# Patient Record
Sex: Female | Born: 1970 | Race: White | Hispanic: No | Marital: Married | Smoking: Former smoker
Health system: Southern US, Community
[De-identification: ages and names within clinical notes are randomized; demographics above are authoritative.]

## PROBLEM LIST (undated history)

## (undated) DIAGNOSIS — G4700 Insomnia, unspecified: Secondary | ICD-10-CM

## (undated) DIAGNOSIS — E079 Disorder of thyroid, unspecified: Secondary | ICD-10-CM

## (undated) DIAGNOSIS — F419 Anxiety disorder, unspecified: Secondary | ICD-10-CM

## (undated) DIAGNOSIS — F32A Depression, unspecified: Secondary | ICD-10-CM

## (undated) DIAGNOSIS — Z8041 Family history of malignant neoplasm of ovary: Secondary | ICD-10-CM

## (undated) DIAGNOSIS — E039 Hypothyroidism, unspecified: Secondary | ICD-10-CM

## (undated) DIAGNOSIS — G4730 Sleep apnea, unspecified: Secondary | ICD-10-CM

## (undated) DIAGNOSIS — J3489 Other specified disorders of nose and nasal sinuses: Secondary | ICD-10-CM

## (undated) DIAGNOSIS — R413 Other amnesia: Secondary | ICD-10-CM

## (undated) DIAGNOSIS — F329 Major depressive disorder, single episode, unspecified: Secondary | ICD-10-CM

## (undated) HISTORY — PX: TUBAL LIGATION: SHX77

## (undated) HISTORY — DX: Other amnesia: R41.3

## (undated) HISTORY — DX: Insomnia, unspecified: G47.00

## (undated) HISTORY — DX: Sleep apnea, unspecified: G47.30

## (undated) HISTORY — PX: LAPAROSCOPIC GASTRIC BANDING: SHX1100

## (undated) HISTORY — DX: Other specified disorders of nose and nasal sinuses: J34.89

## (undated) HISTORY — DX: Family history of malignant neoplasm of ovary: Z80.41

---

## 2004-11-18 ENCOUNTER — Emergency Department: Payer: Self-pay | Admitting: Emergency Medicine

## 2007-09-19 ENCOUNTER — Emergency Department: Payer: Self-pay | Admitting: Emergency Medicine

## 2007-09-19 ENCOUNTER — Other Ambulatory Visit: Payer: Self-pay

## 2014-08-06 ENCOUNTER — Emergency Department
Admit: 2014-08-06 | Disposition: A | Discharge status: Home / Self Care | Payer: Self-pay | Admitting: Emergency Medicine

## 2014-08-06 LAB — CBC
HCT: 38.2 % (ref 35.0–47.0)
HGB: 12.3 g/dL (ref 12.0–16.0)
MCH: 29.5 pg (ref 26.0–34.0)
MCHC: 32.2 g/dL (ref 32.0–36.0)
MCV: 92 fL (ref 80–100)
Platelet: 403 10*3/uL (ref 150–440)
RBC: 4.16 10*6/uL (ref 3.80–5.20)
RDW: 13.5 % (ref 11.5–14.5)
WBC: 13 10*3/uL — AB (ref 3.6–11.0)

## 2014-08-06 LAB — DRUG SCREEN, URINE
AMPHETAMINES, UR SCREEN: NEGATIVE
BARBITURATES, UR SCREEN: NEGATIVE
Benzodiazepine, Ur Scrn: NEGATIVE
Cannabinoid 50 Ng, Ur ~~LOC~~: NEGATIVE
Cocaine Metabolite,Ur ~~LOC~~: NEGATIVE
MDMA (Ecstasy)Ur Screen: NEGATIVE
METHADONE, UR SCREEN: NEGATIVE
OPIATE, UR SCREEN: NEGATIVE
Phencyclidine (PCP) Ur S: NEGATIVE
Tricyclic, Ur Screen: NEGATIVE

## 2014-08-06 LAB — COMPREHENSIVE METABOLIC PANEL
ALK PHOS: 46 U/L
ANION GAP: 9 (ref 7–16)
AST: 19 U/L
Albumin: 4.3 g/dL
BUN: 17 mg/dL
Bilirubin,Total: 0.5 mg/dL
CALCIUM: 8.9 mg/dL
CO2: 29 mmol/L
Chloride: 101 mmol/L
Creatinine: 0.75 mg/dL
EGFR (Non-African Amer.): >60
GLUCOSE: 84 mg/dL
POTASSIUM: 3 mmol/L — AB
SGPT (ALT): 15 U/L
Sodium: 139 mmol/L
Total Protein: 7.6 g/dL

## 2014-08-06 LAB — ACETAMINOPHEN LEVEL: Acetaminophen: <10 ug/mL

## 2014-08-06 LAB — SALICYLATE LEVEL: Salicylates, Serum: <4 mg/dL

## 2014-08-06 LAB — TSH: THYROID STIMULATING HORM: 3.03 u[IU]/mL

## 2014-08-06 LAB — ETHANOL: Ethanol: <5 mg/dL

## 2014-08-30 NOTE — Consult Note (Signed)
Brief Consult Note: Diagnosis: adjustment disorder with anxiety.   Patient was seen by consultant.   Consult note dictated.   Orders entered.   Discussed with Attending MD.   Comments: Psychiatry: PAtient seen and note done. Discussed with Dr Jimmye Norman. Anxious and possibly depressed and adjusting to sobriety but not psychotic or dangerous. Suggest trazodone 100mg  qhs for sleep and follow up out pt appt. Script done.  Electronic Signatures: Gonzella Lex (MD)  (Signed 07-Apr-16 19:23)  Authored: Brief Consult Note   Last Updated: 07-Apr-16 19:23 by Gonzella Lex (MD)

## 2014-08-30 NOTE — Consult Note (Addendum)
PATIENT NAME:  Stephanie Larson, Stephanie Larson MR#:  [id number] DATE OF BIRTH:  October 27, 1970  DATE OF CONSULTATION:  08/06/2014  CONSULTING PHYSICIAN:  Gonzella Lex, MD  IDENTIFYING INFORMATION AND REASON FOR CONSULT: A 44 year old woman seen in the Emergency Room. Consult for sleep and memory loss.   PATIENT'S CHIEF COMPLAINT: "I can't sleep."   HISTORY OF PRESENT ILLNESS: Information obtained from the patient and from her boyfriend and from the chart. The patient reports that she has not been able to sleep at all since this past Sunday. She is feeling drowsy during the day but at nighttime has not been able to fall asleep at all. She is feeling confused during the day and feels like she is having memory loss; feels out of it and like she is cognitively impaired. She denies feeling sad or depressed. She denies having any hallucinations. She admits that she has been very anxious and preoccupied by multiple worries recently. She is involved in a long running conflict with several members of her family who are angry at her. Additionally, her adult daughter recently had a DUI and the patient is very concerned for her welfare. She says that these things weigh on her mind all the time and that during the daytime she cannot stop thinking about them. This has all been building up over the last couple of weeks. The patient denies any suicidal or homicidal ideation. She is not drinking. She has fairly recently discontinued her previous abuse of multiple substances. She had been (Dictation Anomaly) <<takingMISSING TEXT>> narcotics, which she was obtaining largely off the street but self discontinued them in the middle of March. She also stopped taking her Ativan, which she was also obtaining off the street a couple of weeks ago.   PAST PSYCHIATRIC HISTORY: The patient has a history of long-standing substance abuse problems. She has been abusing benzodiazepines and narcotics primarily although cocaine as well. She only recently  decided that she was going to make a serious effort to stop and has now been off of drugs for about a couple of weeks.   The patient is on Wellbutrin and Celexa, which had been chronically prescribed medicines by her primary care doctor for depression. She has (Dictation Anomaly) <<had multipleMISSING TEXT>> psychiatric hospitalizations.   SOCIAL HISTORY: The patient lives with her long-term boyfriend. They appear to have a supportive relationship. She has 3 adult children ranging from ages 56 to the early 70s. The 2 sons live with her. The daughter who is the oldest does not live with her. The patient is not currently working outside the home. She is involved in a conflict with several members of her extended family. If I understood the story correctly, there had been members of her family who had been living with her and her boyfriend for some time and not contributing to the household. She finally asked them to leave and now there is a rift in the family. This is also upsetting her.   MEDICAL HISTORY: Hypothyroid. Restless leg syndrome. Chronic allergies.   SUBSTANCE ABUSE HISTORY: As noted above, long-standing history of abuse of narcotics and benzodiazepines and cocaine.   FAMILY HISTORY: Positive for some other substance abuse.   CURRENT MEDICATIONS: Synthroid 150 mcg per day, Wellbutrin 400 mg sustained release taken divided, Celexa 40 mg a day, Requip 1 mg twice a day, and she is taking over-the-counter Benadryl 50 mg twice a day for allergies.   MEDICATION ALLERGIES: None.   REVIEW OF SYSTEMS: Subjective memory impairment and  confusion. No hallucinations. Difficulty sleeping with her subjective feeling that she has not slept at all in several days. No other acute physical complaints other than her chronic pain in her wrists from carpal tunnel syndrome.   MENTAL STATUS EXAMINATION: Casually dressed, adequately groomed woman, looks her stated age. Cooperative with the interview. Good eye  contact. Psychomotor activity largely normal. Affect: Frequently looks confused. She turns to her boyfriend frequently for the answers to questions, even trivial ones to which she obviously knows the answer. Not tearful. Relaxes a little bit as the interview progresses. Mood stated as being worried. Thoughts nonpsychotic. Lucid. At times she seems to have some slowness and almost some thought blocking. Denies auditory or visual hallucinations. Denies suicidal or homicidal ideation. She is alert and oriented x 4. She can repeat 3 words immediately, remembers only 1 of them at 3 minutes. Judgment and insight appear to be normal. Intelligence normal.   LABORATORY RESULTS: Drug screen is all negative. Chemistry panel: Low potassium at 3.0, otherwise, normal. Alcohol level negative. TSH normal.   VITAL SIGNS: Blood pressure 131/86, respirations 16, pulse 83, temperature 97.9.   ASSESSMENT: A 44 year old woman with a history of chronic depression and anxiety and a history of substance abuse who is in the early stages of sobriety. Presents with subjective lack of sleep and problems with her short-term memory. On further questioning (Dictation Anomaly) <<and review ofMISSING TEXT>> mental status, the patient is clearly not demented. Her memory for recent events is actually intact once she starts relaxing into the conversation. She is able finally to discuss in more detail the anxiety and worry that is troubling her. She is not reporting being depressed and has no suicidal ideation, but the differential diagnosis would include depression as well as an adjustment disorder with anxiety on top of a chronic anxiety condition. Additionally, I suspect that her early stage sobriety is increasing her anxiety and she may still be having some late withdrawal confusion although certainly not a delirium. There is no sign of acute dangerousness.   TREATMENT PLAN: I spent time doing a fair bit of psychoeducation with the patient  as well as supportive and explanatory therapy. I propose starting trazodone 100 mg at night for sleep. I have reassured her that this is not typically an abusable drug. Continue her usual psychiatric medicines. I have strongly encouraged her to begin seeing a psychiatrist for medication management as well as counseling. It turns out she actually has an appointment for April 15 to see a psychiatrist in the hospital outpatient office. I encouraged her to make sure she follows up with that. No indication for commitment, no indication for hospitalization. Case discussed with Emergency Room physician. Prescription given for the trazodone.   DIAGNOSIS, PRINCIPAL AND PRIMARY:  AXIS I: Adjustment disorder with anxiety.   SECONDARY DIAGNOSES:   AXIS I: Depression, not otherwise specified.  AXIS II: Benzodiazepine, opiate, and cocaine abuse, in early remission.  AXIS III: Carpal tunnel syndrome bilaterally with chronic pain, restless leg syndrome, hypothyroid.    ____________________________ Gonzella Lex, MD jtc:AT D: 08/06/2014 23:44:03 ET T: 08/07/2014 01:13:08 ET JOB#: [id number]  cc: Gonzella Lex, MD, <Dictator> Gonzella Lex MD ELECTRONICALLY SIGNED 09/04/2014 10:08

## 2014-08-30 NOTE — Consult Note (Addendum)
PATIENT NAME:  Stephanie Larson, Stephanie Larson MR#:  [id number] DATE OF BIRTH:  06-10-1970  DATE OF CONSULTATION:    REFERRING PHYSICIAN:   CONSULTING PHYSICIAN:  Gonzella Lex, MD  IDENTIFYING INFORMATION AND REASON FOR CONSULTATION: A 44 year old woman seen in the Emergency Room. Consult for sleep and memory loss.   CHIEF COMPLAINT: "I cannot sleep."   HISTORY OF PRESENT ILLNESS: Information obtained from the patient and from her boyfriend and from the chart. The patient reports has not been able to sleep at all since this past Sunday. She is feeling drowsy during the day, but at nighttime has not been able to fall asleep at all. She is feeling confused during the day and feels like she is having memory loss. Feels out of it and like she is cognitively impaired. She denies feeling sad or depressed. She denies having any hallucinations. She admits that she has been very anxious and preoccupied by multiple worries recently. She is involved in a long running conflict with several members of her family who are angry at her. Additionally, her adult daughter recently had a DUI and the patient is very concerned for her welfare. She says that these things weigh on her mind all the time and that during the daytime she cannot stop thinking about them. This has all been building up over the last couple of weeks. The patient denies any suicidal or homicidal ideation. She is not drinking. She has a fairly recently discontinued her previous abuse of multiple substances. She had been (Dictation Anomaly)<<takingMISSING TEXT>> narcotics, which she was obtaining largely off the street, but self-discontinued in the middle of March. She also stopped taking her Ativan, which she was also obtaining off the street couple of weeks ago.   PAST PSYCHIATRIC HISTORY: The patient has a history of longstanding substance abuse problems. She has been abusing benzodiazepines and narcotics primarily, although cocaine as well. She only recently decided  that she was going to make a serious effort to stop and has now been off drugs for about a couple of weeks.   The patient is on Wellbutrin and Celexa, which have been chronically prescribed medicines by her primary care doctor for depression. She has no history of suicide attempt or psychiatric hospitalizations.   SOCIAL HISTORY: The patient lives with her long-term boyfriend. They appear to have supportive relationship. She has 3 adult children ranging from ages 35 to the early 54s. The 2 sons live with her, the daughter, who is the oldest, does not live with her. The patient is not currently working outside the home. She is involved in a conflict with several members of her extended family. If I understood the story correctly, there had been members of her family who had been living with her and her boyfriend for some time and not contributing to the household. She finally asked him to leave and now there is a rift in the family. This is also upsetting her.   PAST MEDICAL HISTORY: Hypothyroid. Restless leg syndrome. Chronic allergies.   SUBSTANCE ABUSE HISTORY: As noted above, long-standing history of abuse of narcotics and benzodiazepines and cocaine.   FAMILY HISTORY: Positive for some other substance abuse.   CURRENT MEDICATIONS: Synthroid 150 mcg per day; Wellbutrin 400 mg sustained release, taken divided; Celexa 40 mg a day; Requip 1 mg twice a day; and she is taking over-the-counter Benadryl 50 mg twice a day for allergies.   MEDICATION ALLERGIES: None.   REVIEW OF SYSTEMS: Subjective memory impairment and confusion. No  hallucinations. Difficulty sleeping with her subjective feeling that she has not slept at all in several days. No other acute physical complaints other than her chronic pain in her wrists from carpal tunnel syndrome.   MENTAL STATUS EXAMINATION: Casually dressed, adequately groomed woman, looks her stated age. Cooperative with the interview. Good eye contact. Psychomotor  activity largely normal. Affect frequently looks confused. She turns to her boyfriend frequently for answers to questions even trivial ones to which she obviously knows the answer. Not tearful. Relaxes a little bit as the interview progresses. Mood stated as being worried. Thoughts nonpsychotic, lucid. At times she seems to have some slowness and almost some thought blocking. Denies auditory or visual hallucinations. Denies suicidal or homicidal ideation. She is alert and oriented x 4. She can repeat 3 words immediately, remembers only 1 of them at 3 minutes. Judgment and insight appear to be normal. Intelligence normal.   LABORATORY RESULTS: Drug screen is all negative. Chemistry panel: Low potassium at 3.0, otherwise normal. Alcohol level negative. TSH normal.   VITAL SIGNS: Blood pressure 131/86, respirations 16, pulse 83, temperature 97.9.   ASSESSMENT: A 44 year old woman with a history of chronic depression and anxiety and a history of substance abuse who is in the early stages of sobriety presents with subjective lack of sleep and problems with her short-term memory. On further questioning and mental status, the patient is clearly not demented. Her memory for recent events is actually intact once she starts relaxing into the conversation. She is able finally to discuss in more detail the anxiety and worry that is troubling her. She is not reporting being depressed and has no suicidal ideation, but the differential diagnosis would include depression as well as an adjustment disorder with anxiety on top of chronic anxiety which condition. Additionally, I suspect that her early stage sobriety is increasing her anxiety and she may still be having some late withdrawal, confusion, although certainly not a delirium. There is no sign of acute dangerousness.   TREATMENT PLAN: I spent time doing a fair bit of psychoeducation with the patient as well as supportive and explanatory therapy. I proposed starting  trazodone 100 mg at night for sleep. I have reassured her that this is not typically abusable drugs. Continue her usual psychiatric medicines. I have strongly encouraged her to begin seeing a psychiatrist for medication management as well as counseling. It turns out she actually has an appointment for April 15 to see a psychiatrist in the hospital outpatient office. I encourage her to make sure she follows up with that. No indication for commitment. No indication for hospitalization. The case discussed with Emergency Room physician. Prescription given for the trazodone.   DIAGNOSES, PRINCIPAL AND PRIMARY:  AXIS I: Adjustment disorder with anxiety.   SECONDARY DIAGNOSES: AXIS I:  1.  Depression, not otherwise specified.  2.  Benzodiazepine, opiate, and cocaine abuse, in early remission.  AXIS III: Carpal tunnel syndrome bilaterally with chronic pain, restless leg syndrome, hypothyroid.   ____________________________ Gonzella Lex, MD jtc:bm D: 08/06/2014 23:44:03 ET T: 08/08/2014 00:13:40 ET JOB#: [id number]  cc: Gonzella Lex, MD, <Dictator> Gonzella Lex MD ELECTRONICALLY SIGNED 09/04/2014 10:09

## 2015-03-28 ENCOUNTER — Emergency Department
Admission: EM | Admit: 2015-03-28 | Discharge: 2015-03-28 | Disposition: A | Discharge status: Home / Self Care | Payer: Self-pay | Attending: Emergency Medicine | Admitting: Emergency Medicine

## 2015-03-28 ENCOUNTER — Encounter: Payer: Self-pay | Admitting: Emergency Medicine

## 2015-03-28 DIAGNOSIS — F17210 Nicotine dependence, cigarettes, uncomplicated: Secondary | ICD-10-CM | POA: Insufficient documentation

## 2015-03-28 DIAGNOSIS — G4700 Insomnia, unspecified: Secondary | ICD-10-CM | POA: Insufficient documentation

## 2015-03-28 DIAGNOSIS — F419 Anxiety disorder, unspecified: Secondary | ICD-10-CM | POA: Insufficient documentation

## 2015-03-28 HISTORY — DX: Depression, unspecified: F32.A

## 2015-03-28 HISTORY — DX: Anxiety disorder, unspecified: F41.9

## 2015-03-28 HISTORY — DX: Disorder of thyroid, unspecified: E07.9

## 2015-03-28 HISTORY — DX: Major depressive disorder, single episode, unspecified: F32.9

## 2015-03-28 MED ORDER — LORAZEPAM 2 MG PO TABS
2.0000 mg | ORAL_TABLET | Freq: Once | ORAL | Status: AC
  Administered 2015-03-28: 2 mg via ORAL
  Filled 2015-03-28: qty 1

## 2015-03-28 MED ORDER — ESZOPICLONE 2 MG PO TABS: 2.0000 mg | ORAL_TABLET | Freq: Every day | ORAL | Status: DC

## 2015-03-28 NOTE — Discharge Instructions (Signed)
Insomnia Insomnia is a sleep disorder that makes it difficult to fall asleep or to stay asleep. Insomnia can cause tiredness (fatigue), low energy, difficulty concentrating, mood swings, and poor performance at work or school.  There are three different ways to classify insomnia:  Difficulty falling asleep.  Difficulty staying asleep.  Waking up too early in the morning. Any type of insomnia can be long-term (chronic) or short-term (acute). Both are common. Short-term insomnia usually lasts for three months or less. Chronic insomnia occurs at least three times a week for longer than three months. CAUSES  Insomnia may be caused by another condition, situation, or substance, such as:  Anxiety.  Certain medicines.  Gastroesophageal reflux disease (GERD) or other gastrointestinal conditions.  Asthma or other breathing conditions.  Restless legs syndrome, sleep apnea, or other sleep disorders.  Chronic pain.  Menopause. This may include hot flashes.  Stroke.  Abuse of alcohol, tobacco, or illegal drugs.  Depression.  Caffeine.   Neurological disorders, such as Alzheimer disease.  An overactive thyroid (hyperthyroidism). The cause of insomnia may not be known. RISK FACTORS Risk factors for insomnia include:  Gender. Women are more commonly affected than men.  Age. Insomnia is more common as you get older.  Stress. This may involve your professional or personal life.  Income. Insomnia is more common in people with lower income.  Lack of exercise.   Irregular work schedule or night shifts.  Traveling between different time zones. SIGNS AND SYMPTOMS If you have insomnia, trouble falling asleep or trouble staying asleep is the main symptom. This may lead to other symptoms, such as:  Feeling fatigued.  Feeling nervous about going to sleep.  Not feeling rested in the morning.  Having trouble concentrating.  Feeling irritable, anxious, or depressed. TREATMENT   Treatment for insomnia depends on the cause. If your insomnia is caused by an underlying condition, treatment will focus on addressing the condition. Treatment may also include:   Medicines to help you sleep.  Counseling or therapy.  Lifestyle adjustments. HOME CARE INSTRUCTIONS   Take medicines only as directed by your health care provider.  Keep regular sleeping and waking hours. Avoid naps.  Keep a sleep diary to help you and your health care provider figure out what could be causing your insomnia. Include:   When you sleep.  When you wake up during the night.  How well you sleep.   How rested you feel the next day.  Any side effects of medicines you are taking.  What you eat and drink.   Make your bedroom a comfortable place where it is easy to fall asleep:  Put up shades or special blackout curtains to block light from outside.  Use a white noise machine to block noise.  Keep the temperature cool.   Exercise regularly as directed by your health care provider. Avoid exercising right before bedtime.  Use relaxation techniques to manage stress. Ask your health care provider to suggest some techniques that may work well for you. These may include:  Breathing exercises.  Routines to release muscle tension.  Visualizing peaceful scenes.  Cut back on alcohol, caffeinated beverages, and cigarettes, especially close to bedtime. These can disrupt your sleep.  Do not overeat or eat spicy foods right before bedtime. This can lead to digestive discomfort that can make it hard for you to sleep.  Limit screen use before bedtime. This includes:  Watching TV.  Using your smartphone, tablet, and computer.  Stick to a routine. This   can help you fall asleep faster. Try to do a quiet activity, brush your teeth, and go to bed at the same time each night.  Get out of bed if you are still awake after 15 minutes of trying to sleep. Keep the lights down, but try reading or  doing a quiet activity. When you feel sleepy, go back to bed.  Make sure that you drive carefully. Avoid driving if you feel very sleepy.  Keep all follow-up appointments as directed by your health care provider. This is important. SEEK MEDICAL CARE IF:   You are tired throughout the day or have trouble in your daily routine due to sleepiness.  You continue to have sleep problems or your sleep problems get worse. SEEK IMMEDIATE MEDICAL CARE IF:   You have serious thoughts about hurting yourself or someone else.   This information is not intended to replace advice given to you by your health care provider. Make sure you discuss any questions you have with your health care provider.   Document Released: 04/14/2000 Document Revised: 01/06/2015 Document Reviewed: 01/16/2014 Else[employer] Interactive Patient Education 2016 [employer]  

## 2015-03-28 NOTE — ED Notes (Addendum)
Pt states [employer] has been adjusting her medications. Pt states she has not slept for 3 days and that it has been an ongoing issue for 20+ years. She states she feels "hopeless" and that this has been the "worst year ever". 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 Pt states [facility] stopped her anxiety meds in October without telling her she would no longer have prescription. Pt states daughter is having suicidal thoughts because of her depression and anxiety.

## 2015-03-28 NOTE — ED Provider Notes (Signed)
[facility] Emergency Department Provider Note  ____________________________________________  Time seen: On arrival  I have reviewed the triage vital signs and the nursing notes.   HISTORY  Chief Complaint Anxiety and Insomnia    HPI Stephanie Larson is a 44 y.o. female who presents with complaints of insomnia and anxiety. She reports she is here because she needs imaging to help her sleep. She denies suicidal ideation, homicidal ideation. She denies depression. She reports she has had many years of difficulty sleeping and she used to have better sleep with lorazepam but her physicians took her off of that. She denies fevers chills. No pain although she does suffer from restless leg syndrome. She follows with [facility]     Past Medical History  Diagnosis Date  . Anxiety   . Depression   . Thyroid disease     There are no active problems to display for this patient.   Past Surgical History  Procedure Laterality Date  . Tubal ligation      Current Outpatient Rx  Name  Route  Sig  Dispense  Refill  . eszopiclone (LUNESTA) 2 MG TABS tablet   Oral   Take 1 tablet (2 mg total) by mouth at bedtime. Take immediately before bedtime   20 tablet   1     Allergies Seroquel  No family history on file.  Social History Social History  Substance Use Topics  . Smoking status: Current Every Day Smoker -- 0.50 packs/day    Types: Cigarettes  . Smokeless tobacco: None  . Alcohol Use: Yes    Review of Systems  Constitutional: Negative for fever. Eyes: Negative for visual changes. ENT: Negative for sore throat Cardiovascular: Negative for chest pain. Respiratory: Negative for shortness of breath. Gastrointestinal: Negative for abdominal pain, vomiting and diarrhea. Genitourinary: Negative for dysuria. Musculoskeletal: Negative for back pain. Skin: Negative for rash. Neurological: Negative for headaches or focal  weakness Psychiatric: Positive for anxiety    ____________________________________________   PHYSICAL EXAM:  VITAL SIGNS: ED Triage Vitals  Enc Vitals Group     BP 03/28/15 0908 149/98 mmHg     Pulse Rate 03/28/15 0908 87     Resp 03/28/15 0908 18     Temp 03/28/15 0908 97.8 F (36.6 C)     Temp Source 03/28/15 0908 Oral     SpO2 03/28/15 0908 99 %     Weight 03/28/15 0908 200 lb (90.719 kg)     Height 03/28/15 0908 5\' 6"  (1.676 m)     Head Cir --      Peak Flow --      Pain Score --      Pain Loc --      Pain Edu? --      Excl. in [place]? --      Constitutional: Alert and oriented. Well appearing and in no distress. Anxious Eyes: Conjunctivae are normal.  ENT   Head: Normocephalic and atraumatic.   Mouth/Throat: Mucous membranes are moist. Cardiovascular: Normal rate, regular rhythm. Normal and symmetric distal pulses are present in all extremities. No murmurs, rubs, or gallops. Respiratory: Normal respiratory effort without tachypnea nor retractions. Breath sounds are clear and equal bilaterally.  Gastrointestinal: Soft and non-tender in all quadrants. No distention. There is no CVA tenderness. Genitourinary: deferred Musculoskeletal: Nontender with normal range of motion in all extremities. No lower extremity tenderness nor edema. Neurologic:  Normal speech and language. No gross focal neurologic deficits are appreciated. Skin:  Skin  is warm, dry and intact. No rash noted. Psychiatric: Mood and affect are normal. Patient exhibits appropriate insight and judgment.  ____________________________________________    LABS (pertinent positives/negatives)  Labs Reviewed - No data to display  ____________________________________________   EKG  None  ____________________________________________    RADIOLOGY I have personally reviewed any xrays that were ordered on this  patient: None  ____________________________________________   PROCEDURES  Procedure(s) performed: none  Critical Care performed: none  ____________________________________________   INITIAL IMPRESSION / ASSESSMENT AND PLAN / ED COURSE  Pertinent labs & imaging results that were available during my care of the patient were reviewed by me and considered in my medical decision making (see chart for details).  Patient presents with anxiety primarily related to difficult sleeping. 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 No SI or HI and she has no interest in seeing psychiatry. I will prescribe Lunesta as she has not tried that before  ____________________________________________   FINAL CLINICAL IMPRESSION(S) / ED DIAGNOSES  Final diagnoses:  Insomnia  Anxiety     Lavonia Drafts, MD 03/28/15 [phone]

## 2015-03-28 NOTE — ED Notes (Signed)
States history of anxiety, states MD stopped her vistiril which she took for anxiety, states has been unable to sleep x 3 days, crying in triage.

## 2015-03-28 NOTE — ED Notes (Signed)
Pt verbalized understanding of discharge instructions. NAD at this time. 

## 2016-02-07 ENCOUNTER — Inpatient Hospital Stay: Payer: Self-pay | Admitting: Hematology and Oncology

## 2016-02-20 NOTE — Progress Notes (Deleted)
[facility] day:  02/20/2016  Chief Complaint: Stephanie Larson is a 45 y.o. female with leukocytosis who is referred by Dr. Humphrey Rolls for assessment and management.  HPI: ***  Past Medical History:  Diagnosis Date  . Anxiety   . Depression   . Thyroid disease     Past Surgical History:  Procedure Laterality Date  . TUBAL LIGATION      No family history on file.  Social History:  reports that she has been smoking Cigarettes.  She has been smoking about 0.50 packs per day. She does not have any smokeless tobacco history on file. She reports that she drinks alcohol. Her drug history is not on file.  The patient is accompanied by *** alone today.  Allergies:  Allergies  Allergen Reactions  . Seroquel [Quetiapine Fumarate] Other (See Comments)    nightmares    Current Medications: Current Outpatient Prescriptions  Medication Sig Dispense Refill  . eszopiclone (LUNESTA) 2 MG TABS tablet Take 1 tablet (2 mg total) by mouth at bedtime. Take immediately before bedtime 20 tablet 1   No current facility-administered medications for this visit.     Review of Systems:  GENERAL:  Feels good.  Active.  No fevers, sweats or weight loss. PERFORMANCE STATUS (ECOG):  *** HEENT:  No visual changes, runny nose, sore throat, mouth sores or tenderness. Lungs: No shortness of breath or cough.  No hemoptysis. Cardiac:  No chest pain, palpitations, orthopnea, or PND. GI:  No nausea, vomiting, diarrhea, constipation, melena or hematochezia. GU:  No urgency, frequency, dysuria, or hematuria. Musculoskeletal:  No back pain.  No joint pain.  No muscle tenderness. Extremities:  No pain or swelling. Skin:  No rashes or skin changes. Neuro:  No headache, numbness or weakness, balance or coordination issues. Endocrine:  No diabetes, thyroid issues, hot flashes or night sweats. Psych:  No mood changes, depression or anxiety. Pain:  No focal pain. Review of  systems:  All other systems reviewed and found to be negative.   Physical Exam: There were no vitals taken for this visit. GENERAL:  Well developed, well nourished, sitting comfortably in the exam room in no acute distress. MENTAL STATUS:  Alert and oriented to person, place and time. HEAD:  *** hair.  Normocephalic, atraumatic, face symmetric, no Cushingoid features. EYES:  *** eyes.  Pupils equal round and reactive to light and accomodation.  No conjunctivitis or scleral icterus. ENT:  Oropharynx clear without lesion.  Tongue normal. Mucous membranes moist.  RESPIRATORY:  Clear to auscultation without rales, wheezes or rhonchi. CARDIOVASCULAR:  Regular rate and rhythm without murmur, rub or gallop. ABDOMEN:  Soft, non-tender, with active bowel sounds, and no hepatosplenomegaly.  No masses. SKIN:  No rashes, ulcers or lesions. EXTREMITIES: No edema, no skin discoloration or tenderness.  No palpable cords. LYMPH NODES: No palpable cervical, supraclavicular, axillary or inguinal adenopathy  NEUROLOGICAL: Unremarkable. PSYCH:  Appropriate.  No visits with results within 3 Day(s) from this visit.  Latest known visit with results is:  Hospital Outpatient Visit on 08/06/2014  Component Date Value Ref Range Status  . Glucose, CSF 08/06/2014 DNP  mg/dL Corrected   Comment: 65-99 NOTE: New Reference Range  07/07/14   . BUN 08/06/2014 17  mg/dL Final   Comment: 6-20 NOTE: New Reference Range  07/07/14   . Creatinine 08/06/2014 0.75  mg/dL Final   Comment: 0.44-1.00 NOTE: New Reference Range  07/07/14   . Sodium, Urine Random  08/06/2014 DNP  mmol/L Corrected   Comment: 135-145 NOTE: New Reference Range  07/07/14   . Potassium, Urine Random 08/06/2014 DNP  mmol/L Corrected   Comment: 3.5-5.1 NOTE: New Reference Range  07/07/14   . Chloride, Urine Random 08/06/2014 DNP  mmol/L Corrected   Comment: 101-111 NOTE: New Reference Range  07/07/14   . Co2 08/06/2014 29  mmol/L Final    Comment: 22-32 NOTE: New Reference Range  07/07/14   . Calcium, Total 08/06/2014 8.9  mg/dL Final   Comment: 8.9-10.3 NOTE: New Reference Range  07/07/14   . SGOT(AST) 08/06/2014 19  U/L Final   Comment: 15-41 NOTE: New Reference Range  07/07/14   . SGPT (ALT) 08/06/2014 15  U/L Final   Comment: 14-54 NOTE: New Reference Range  07/07/14   . Alkaline Phosphatase 08/06/2014 46  U/L Final   Comment: 38-126 NOTE: New Reference Range  07/07/14   . Albumin 08/06/2014 4.3  g/dL Final   Comment: 3.5-5.0 NOTE: New reference range  07/07/14   . Total Protein 08/06/2014 7.6  g/dL Final   Comment: 6.5-8.1 NOTE: New Reference Range  07/07/14   . Bilirubin,Total 08/06/2014 0.5  mg/dL Final   Comment: 0.3-1.2 NOTE: New Reference Range  07/07/14   . Anion Gap 08/06/2014 9  7 - 16 Final  . EGFR (African American) 08/06/2014 >60   Final  . EGFR (Non-African Amer.) 08/06/2014 >60   Final   Comment: eGFR values <77m/min/1.73 m2 may be an indication of chronic kidney disease (CKD). Calculated eGFR is useful in patients with stable renal function. The eGFR calculation will not be reliable in acutely ill patients when serum creatinine is changing rapidly. It is not useful in patients on dialysis. The eGFR calculation may not be applicable to patients at the low and high extremes of body sizes, pregnant women, and vegetarians.   . Glucose 08/06/2014 84  mg/dL Final   Comment: 65-99 NOTE: New Reference Range  07/07/14   . Sodium 08/06/2014 139  mmol/L Final   Comment: 135-145 NOTE: New Reference Range  07/07/14   . Potassium 08/06/2014 3.0* mmol/L Final   Comment: 3.5-5.1 NOTE: New Reference Range  07/07/14   . Chloride 08/06/2014 101  mmol/L Final   Comment: 101-111 NOTE: New Reference Range  07/07/14   . WBC 08/06/2014 13.0* 3.6 - 11.0 x10 3/mm 3 Final  . RBC 08/06/2014 4.16  3.80 - 5.20 X10 6/mm 3 Final  . HGB 08/06/2014 12.3  12.0 - 16.0 g/dL Final  . HCT  08/06/2014 38.2  35.0 - 47.0 % Final  . MCV 08/06/2014 92  80 - 100 fL Final  . MCH 08/06/2014 29.5  26.0 - 34.0 pg Final  . MCHC 08/06/2014 32.2  32.0 - 36.0 g/dL Final  . RDW 08/06/2014 13.5  11.5 - 14.5 % Final  . Platelet 08/06/2014 403  150 - 440 x10 3/mm 3 Final  . Thyroid Stimulating Horm 08/06/2014 3.030  uIU/mL Final   Comment: 0.350-4.500 NOTE: New Reference Range  07/07/14   . Ethanol 08/06/2014 <5  mg/dL Final   Comment: 0-0 NOTE: New Reference Range:  07/07/14   . Salicylates, Serum 095/12/3265<4  mg/dL Final   Comment: 2.8-30.0 Therapeutic < 30.0 mg/dL TOXIC >29.9 mg/dL NOTE: New Reference Range:  07/07/14   . Acetaminophen 08/06/2014 <10  ug/mL Final   Comment: 10-30 NOTE: New Reference Range  07/07/14   . Tricyclic, Ur Screen 012/45/8099NEGATIVE   Final  . Amphetamines, Ur  Screen 08/06/2014 NEGATIVE   Final  . MDMA (Ecstasy)Ur Screen 08/06/2014 NEGATIVE   Final  . Cocaine Metabolite,Ur [place] 08/06/2014 NEGATIVE   Final  . Opiate, Ur Screen 08/06/2014 NEGATIVE   Final  . Phencyclidine (PCP) Ur S 08/06/2014 NEGATIVE   Final  . Cannabinoid 50 Ng, Ur [place] 08/06/2014 NEGATIVE   Final  . Barbiturates, Ur Screen 08/06/2014 NEGATIVE   Final  . Methadone, Ur Screen 08/06/2014 NEGATIVE   Final  . Benzodiazepine, Ur Scrn 08/06/2014 NEGATIVE   Final   Comment:                            ------------------ The URINE DRUG SCREEN provides only a preliminary, unconfirmed analytical test result and should not be used for non-medical purposes.  Clinical consideration and professional judgment should be applied to any positive drug screen result due to possible interfering substances.  A more specific alternate chemical method must be used in order to obtain a confirmed analytical result.  Gas chromatography/mass spectrometry (GC/MS) is the preferred confirmatory method.     Assessment:  Stephanie Larson is a 45 y.o. female  ***  Plan: 1. *** 2. *** 3. *** 4. *** 5. ***  Lequita Asal, MD  02/20/2016, 10:26 PM

## 2016-02-21 ENCOUNTER — Inpatient Hospital Stay: Payer: Self-pay | Admitting: Hematology and Oncology

## 2016-03-06 ENCOUNTER — Encounter: Payer: Self-pay | Admitting: Hematology and Oncology

## 2016-03-06 ENCOUNTER — Ambulatory Visit
Admission: RE | Admit: 2016-03-06 | Discharge: 2016-03-06 | Disposition: A | Discharge status: Home / Self Care | Payer: Self-pay | Source: Ambulatory Visit | Attending: Hematology and Oncology | Admitting: Hematology and Oncology

## 2016-03-06 ENCOUNTER — Inpatient Hospital Stay: Payer: Self-pay | Attending: Hematology and Oncology | Admitting: Hematology and Oncology

## 2016-03-06 ENCOUNTER — Encounter (INDEPENDENT_AMBULATORY_CARE_PROVIDER_SITE_OTHER): Payer: Self-pay

## 2016-03-06 ENCOUNTER — Ambulatory Visit
Admit: 2016-03-06 | Discharge: 2016-03-06 | Disposition: A | Discharge status: Home / Self Care | Payer: Self-pay | Attending: Hematology and Oncology | Admitting: Hematology and Oncology

## 2016-03-06 ENCOUNTER — Inpatient Hospital Stay: Payer: Self-pay

## 2016-03-06 VITALS — BP 110/77 | HR 81 | Temp 97.9°F | Resp 18 | Ht 66.0 in | Wt 203.5 lb

## 2016-03-06 DIAGNOSIS — D72829 Elevated white blood cell count, unspecified: Secondary | ICD-10-CM

## 2016-03-06 DIAGNOSIS — G4730 Sleep apnea, unspecified: Secondary | ICD-10-CM | POA: Insufficient documentation

## 2016-03-06 DIAGNOSIS — D75839 Thrombocytosis, unspecified: Secondary | ICD-10-CM

## 2016-03-06 DIAGNOSIS — D473 Essential (hemorrhagic) thrombocythemia: Secondary | ICD-10-CM

## 2016-03-06 DIAGNOSIS — F419 Anxiety disorder, unspecified: Secondary | ICD-10-CM | POA: Insufficient documentation

## 2016-03-06 DIAGNOSIS — F329 Major depressive disorder, single episode, unspecified: Secondary | ICD-10-CM | POA: Insufficient documentation

## 2016-03-06 DIAGNOSIS — E079 Disorder of thyroid, unspecified: Secondary | ICD-10-CM | POA: Insufficient documentation

## 2016-03-06 DIAGNOSIS — R531 Weakness: Secondary | ICD-10-CM | POA: Insufficient documentation

## 2016-03-06 DIAGNOSIS — Z79899 Other long term (current) drug therapy: Secondary | ICD-10-CM | POA: Insufficient documentation

## 2016-03-06 DIAGNOSIS — F17210 Nicotine dependence, cigarettes, uncomplicated: Secondary | ICD-10-CM | POA: Insufficient documentation

## 2016-03-06 LAB — CBC WITH DIFFERENTIAL/PLATELET
Basophils Absolute: 0.1 10*3/uL (ref 0–0.1)
Basophils Relative: 1 %
Eosinophils Absolute: 0.3 10*3/uL (ref 0–0.7)
Eosinophils Relative: 2 %
HCT: 39.3 % (ref 35.0–47.0)
Hemoglobin: 13.5 g/dL (ref 12.0–16.0)
Lymphocytes Relative: 17 %
Lymphs Abs: 2.4 10*3/uL (ref 1.0–3.6)
MCH: 29 pg (ref 26.0–34.0)
MCHC: 34.5 g/dL (ref 32.0–36.0)
MCV: 84.2 fL (ref 80.0–100.0)
Monocytes Absolute: 0.8 10*3/uL (ref 0.2–0.9)
Monocytes Relative: 6 %
Neutro Abs: 10.3 10*3/uL — ABNORMAL HIGH (ref 1.4–6.5)
Neutrophils Relative %: 74 %
Platelets: 410 10*3/uL (ref 150–440)
RBC: 4.67 MIL/uL (ref 3.80–5.20)
RDW: 12.7 % (ref 11.5–14.5)
WBC: 13.8 10*3/uL — ABNORMAL HIGH (ref 3.6–11.0)

## 2016-03-06 LAB — COMPREHENSIVE METABOLIC PANEL
ALT: 33 U/L (ref 14–54)
AST: 30 U/L (ref 15–41)
Albumin: 4 g/dL (ref 3.5–5.0)
Alkaline Phosphatase: 55 U/L (ref 38–126)
Anion gap: 9 (ref 5–15)
BUN: 12 mg/dL (ref 6–20)
CO2: 26 mmol/L (ref 22–32)
Calcium: 9.8 mg/dL (ref 8.9–10.3)
Chloride: 103 mmol/L (ref 101–111)
Creatinine, Ser: 0.77 mg/dL (ref 0.44–1.00)
GFR calc Af Amer: >60 mL/min (ref >60)
GFR calc non Af Amer: >60 mL/min (ref >60)
Glucose, Bld: 97 mg/dL (ref 65–99)
Potassium: 4 mmol/L (ref 3.5–5.1)
Sodium: 138 mmol/L (ref 135–145)
Total Bilirubin: 0.4 mg/dL (ref 0.3–1.2)
Total Protein: 7.7 g/dL (ref 6.5–8.1)

## 2016-03-06 LAB — C-REACTIVE PROTEIN: CRP: <0.8 mg/dL (ref <1.0)

## 2016-03-06 LAB — IRON AND TIBC
Iron: 65 ug/dL (ref 28–170)
Saturation Ratios: 18 % (ref 10.4–31.8)
TIBC: 360 ug/dL (ref 250–450)
UIBC: 295 ug/dL

## 2016-03-06 LAB — SEDIMENTATION RATE: Sed Rate: 42 mm/hr — ABNORMAL HIGH (ref 0–20)

## 2016-03-06 LAB — FERRITIN: Ferritin: 34 ng/mL (ref 11–307)

## 2016-03-06 NOTE — Progress Notes (Signed)
Patient here today as new evaluation regarding elevated WBC's.  Referred by [facility] [place] (Dr. Humphrey Rolls).

## 2016-03-06 NOTE — Progress Notes (Signed)
[facility] day:  03/06/2016  Chief Complaint: Stephanie Larson is a 44 y.o. female with leukocytosis who is referred by Dr. Humphrey Rolls for assessment and management.  HPI: The patient notes a 6 month history of ankle, knee, and elbow swelling. She notes that her muscles are weak and it "hurts down to the bones".  She states that her WBC count has been elevated.  She was given a course of antibiotics.  She states that she was given something for arthritis, but it did not help.  She denies any issues with recurrent infections.  She has not been on prednisone or received steroid injections.  She denies any fever or sweats.  She denies any weight loss.  She describes being "fatigued for 10 years".  She has some "numbness in her hands at night".  She has shortness of breath on exertion.    She was diagnosed with sleep apnea 1 month ago.  She has been on CPAP for 1 month.  She does not feel better.  She has trouble sleeping.  She does not feel tired.  Her thyroid medication was increased 2-3 months ago.    Regarding her diet, she states that she eats "everything".  She denies any cravings.  Menses are not heavy.  CBC on 08/06/2014 revealed a hematocrit of 38.2, hemoglobin 12.3, MCV 92, platelets 403,000, and WBC 13,000.  CBC on 12/20/2015 revealed a hematocrit of 36, hemoglobin 12.2, MCV 88, platelets 464,000, and WBC 12,400.  CBC on 12/29/2015 revealed a hematocrit of 38.0, hemoglobin 13.0, MCV 87, platelets 539,000, and WBC 14,000.  ANA was negative.  ESR was 11.   Past Medical History:  Diagnosis Date  . Anxiety   . Depression   . Insomnia   . Memory changes   . Sinus drainage   . Sleep apnea   . Thyroid disease     Past Surgical History:  Procedure Laterality Date  . LAPAROSCOPIC GASTRIC BANDING    . TUBAL LIGATION      No family history on file.  Social History:  reports that she has been smoking Cigarettes.  She has been smoking about 0.50 packs  per day. She does not have any smokeless tobacco history on file. She reports that she drinks alcohol. Her drug history is not on file.  She has smoked for 15 years.  The patient is alone today.  Allergies:  Allergies  Allergen Reactions  . Seroquel [Quetiapine Fumarate] Other (See Comments)    nightmares    Current Medications: Current Outpatient Prescriptions  Medication Sig Dispense Refill  . calcium carbonate (OSCAL) 1500 (600 Ca) MG TABS tablet Take by mouth 2 (two) times daily with a meal.    . levothyroxine (SYNTHROID, LEVOTHROID) 150 MCG tablet Take by mouth.    . Multiple Vitamins-Minerals (MULTIVITAMIN WITH MINERALS) tablet Take 1 tablet by mouth daily.    Marland Kitchen rOPINIRole (REQUIP) 0.25 MG tablet Take by mouth.    . zolpidem (AMBIEN CR) 12.5 MG CR tablet Take 12.5 mg by mouth at bedtime as needed for sleep.     No current facility-administered medications for this visit.     Review of Systems:  GENERAL:  Fatigue x 10 years.  No fevers, sweats or weight loss. PERFORMANCE STATUS (ECOG):  1 HEENT:  Rare blurry vision.  No runny nose, sore throat, mouth sores or tenderness. Lungs: Shortness of breath on exertion.  No cough.  No hemoptysis.  CPAP x 1 month.  Cardiac:  No chest pain, palpitations, orthopnea, or PND. GI:  Constipation.  Nausea, sometimes.  No vomiting, diarrhea, melena or hematochezia. GU:  No urgency, frequency, dysuria, or hematuria. Musculoskeletal:  Ankle, knee, elbow, and hand swelling.  Hurts down to bone.  Muscles weak. Extremities:  No pain or swelling. Skin:  No rashes or skin changes. Neuro:  Numbness and tingling in hands at night.  No headache, focal weakness, balance or coordination issues. Endocrine:  No diabetes, thyroid issues, hot flashes or night sweats. Psych:  No mood changes, depression or anxiety. Pain:  No focal pain. Review of systems:  All other systems reviewed and found to be negative.  Physical Exam: Blood pressure 110/77, pulse 81,  temperature 97.9 F (36.6 C), temperature source Tympanic, resp. rate 18, height 5' 6"  (1.676 m), weight 203 lb 7.8 oz (92.3 kg), last menstrual period 02/19/2016. GENERAL:  Well developed, well nourished, woman who appears younger than stated age.  She is sitting comfortably in the exam room in no acute distress. MENTAL STATUS:  Alert and oriented to person, place and time. HEAD:  Brown hair with blonde highlights.  Normocephalic, atraumatic, face symmetric, no Cushingoid features. EYES:  Blue eyes.  Pupils equal round and reactive to light and accomodation.  No conjunctivitis or scleral icterus. ENT:  Oropharynx clear without lesion.  Tongue normal. Mucous membranes moist.  RESPIRATORY:  Clear to auscultation without rales, wheezes or rhonchi. CARDIOVASCULAR:  Regular rate and rhythm without murmur, rub or gallop. ABDOMEN:  Soft, non-tender, with active bowel sounds, and no appreciable hepatosplenomegaly.  No masses. SKIN:  No rashes, ulcers or lesions. EXTREMITIES: No edema, no skin discoloration or tenderness.  No palpable cords. LYMPH NODES: No palpable cervical, supraclavicular, axillary or inguinal adenopathy  NEUROLOGICAL: Unremarkable. PSYCH:  Appropriate.   Appointment on 03/06/2016  Component Date Value Ref Range Status  . WBC 03/06/2016 13.8* 3.6 - 11.0 K/uL Final  . RBC 03/06/2016 4.67  3.80 - 5.20 MIL/uL Final  . Hemoglobin 03/06/2016 13.5  12.0 - 16.0 g/dL Final  . HCT 03/06/2016 39.3  35.0 - 47.0 % Final  . MCV 03/06/2016 84.2  80.0 - 100.0 fL Final  . MCH 03/06/2016 29.0  26.0 - 34.0 pg Final  . MCHC 03/06/2016 34.5  32.0 - 36.0 g/dL Final  . RDW 03/06/2016 12.7  11.5 - 14.5 % Final  . Platelets 03/06/2016 410  150 - 440 K/uL Final  . Neutrophils Relative % 03/06/2016 74  % Final  . Neutro Abs 03/06/2016 10.3* 1.4 - 6.5 K/uL Final  . Lymphocytes Relative 03/06/2016 17  % Final  . Lymphs Abs 03/06/2016 2.4  1.0 - 3.6 K/uL Final  . Monocytes Relative 03/06/2016 6  %  Final  . Monocytes Absolute 03/06/2016 0.8  0.2 - 0.9 K/uL Final  . Eosinophils Relative 03/06/2016 2  % Final  . Eosinophils Absolute 03/06/2016 0.3  0 - 0.7 K/uL Final  . Basophils Relative 03/06/2016 1  % Final  . Basophils Absolute 03/06/2016 0.1  0 - 0.1 K/uL Final  . Sodium 03/06/2016 138  135 - 145 mmol/L Final  . Potassium 03/06/2016 4.0  3.5 - 5.1 mmol/L Final  . Chloride 03/06/2016 103  101 - 111 mmol/L Final  . CO2 03/06/2016 26  22 - 32 mmol/L Final  . Glucose, Bld 03/06/2016 97  65 - 99 mg/dL Final  . BUN 03/06/2016 12  6 - 20 mg/dL Final  . Creatinine, Ser 03/06/2016 0.77  0.44 - 1.00 mg/dL  Final  . Calcium 03/06/2016 9.8  8.9 - 10.3 mg/dL Final  . Total Protein 03/06/2016 7.7  6.5 - 8.1 g/dL Final  . Albumin 03/06/2016 4.0  3.5 - 5.0 g/dL Final  . AST 03/06/2016 30  15 - 41 U/L Final  . ALT 03/06/2016 33  14 - 54 U/L Final  . Alkaline Phosphatase 03/06/2016 55  38 - 126 U/L Final  . Total Bilirubin 03/06/2016 0.4  0.3 - 1.2 mg/dL Final  . GFR calc non Af Amer 03/06/2016 >60  >60 mL/min Final  . GFR calc Af Amer 03/06/2016 >60  >60 mL/min Final   Comment: (NOTE) The eGFR has been calculated using the CKD EPI equation. This calculation has not been validated in all clinical situations. eGFR's persistently <60 mL/min signify possible Chronic Kidney Disease.   . Anion gap 03/06/2016 9  5 - 15 Final  . Sed Rate 03/06/2016 42* 0 - 20 mm/hr Final  . CRP 03/06/2016 <0.80  <1.0 mg/dL Final  . Ferritin 03/06/2016 34  11 - 307 ng/mL Final  . Iron 03/06/2016 65  28 - 170 ug/dL Final  . TIBC 03/06/2016 360  250 - 450 ug/dL Final  . Saturation Ratios 03/06/2016 18  10.4 - 31.8 % Final  . UIBC 03/06/2016 295  ug/dL Final  . Specimen Type 03/10/2016 BLOOD   Final  . Cells Counted 03/10/2016 200   Final  . Cells Analyzed 03/10/2016 200   Final  . FISH Result 03/10/2016 Comment:   Final  . Interpretation 03/10/2016 Comment:   Final   Comment: (NOTE) .nuc ish  9q34(ASS1,ABL1)x2,22q11.2(BCRx2)[200].      The fluorescence in situ hybridization (FISH) study was normal. FISH, using unique sequence DNA probes for the ABL1 and BCR gene regions showed two ABL1 signals (red), two control ASS1  gene signals (aqua) located adjacent to the ABL1 locus at 9q34, and two BCR signals (green) at 22q11.2 in all interphase nuclei examined. There was NO evidence of CML or ALL-associated BCR/ABL1 dual fusion signals in this analysis.  .      This analysis is limited to abnormalities detectable by the specific probes included in the study. FISH results should be interpreted within the context of a full cytogenetic analysis and pathology evaluation.  Marland Kitchen  .This test was developed and its performance characteristics determined by [employer]). It has not been cleared or approved by the U.S. Food and Drug Administration. The DNA probe vendor for this study was [employer] research (physical sciences)).   . Director Review: 03/10/2016 Comment:   Final   Comment: (NOTE) M. Sinclair Grooms, PhD, [facility] Performed At: [facility] [place], [place] [id number] Nechama Guard MD NK:[id number]     Assessment:  CYERA BALBONI is a 45 y.o. female with probable reactive leukocytosis.  WBC has ranged between 12,400 - 14,000 without trend.  Differential reveals predominant neutrophils.  She has no issues with infections.  She has not been on steroids or received steroid injections.  She smokes.  She has mild thrombocytosis.  Platelet count has increased from 403,000 in 07/2014 to 539,000 in 11/2015.  Symptomatically, she notes fatigue x 10 years.  She has a 6 month history of ankle, knee, and elbow swelling. She notes that her muscles are weak and it "hurts down to the bones".    Plan: 1.  Discuss mild leukocytosis and thrombocytosis.  Etiology is felt reactive.  Discuss laboratory testing.  Doubt myeloprloliferative  disorder. 2.  Labs today:  CBC with diff, CMP, ESR, CRP, ferritin, iron studies, BCR-ABL, JAK2 with reflex. 3.  CXR (PA and lateral). 4.  RTC in 1 week for MD review of testing.   Lequita Asal, MD  03/06/2016, 2:43 PM

## 2016-03-10 LAB — BCR-ABL1 FISH
Cells Analyzed: 200
Cells Counted: 200

## 2016-03-13 ENCOUNTER — Inpatient Hospital Stay (HOSPITAL_BASED_OUTPATIENT_CLINIC_OR_DEPARTMENT_OTHER): Payer: Self-pay | Admitting: Hematology and Oncology

## 2016-03-13 ENCOUNTER — Inpatient Hospital Stay: Payer: Self-pay

## 2016-03-13 VITALS — BP 119/80 | HR 85 | Temp 96.6°F | Resp 18 | Wt 211.6 lb

## 2016-03-13 DIAGNOSIS — M791 Myalgia, unspecified site: Secondary | ICD-10-CM

## 2016-03-13 DIAGNOSIS — D72829 Elevated white blood cell count, unspecified: Secondary | ICD-10-CM

## 2016-03-13 DIAGNOSIS — M2550 Pain in unspecified joint: Secondary | ICD-10-CM

## 2016-03-13 DIAGNOSIS — D75839 Thrombocytosis, unspecified: Secondary | ICD-10-CM

## 2016-03-13 DIAGNOSIS — D473 Essential (hemorrhagic) thrombocythemia: Secondary | ICD-10-CM

## 2016-03-13 DIAGNOSIS — R531 Weakness: Secondary | ICD-10-CM

## 2016-03-13 DIAGNOSIS — Z79899 Other long term (current) drug therapy: Secondary | ICD-10-CM

## 2016-03-13 DIAGNOSIS — F17210 Nicotine dependence, cigarettes, uncomplicated: Secondary | ICD-10-CM

## 2016-03-13 LAB — CK: Total CK: 46 U/L (ref 38–234)

## 2016-03-13 NOTE — Progress Notes (Signed)
[facility] day:  03/13/2016  Chief Complaint: Stephanie Larson is a 45 y.o. female with leukocytosis who is seen for review of work-up and discussion regarding direction of therapy.  HPI: The patient was last seen in the hematology clinic on 03/06/2016.  At that time, she was seen for initial consultation.  She had mild leukocytosis felt reactive in nature.  Work-up revealed a hematocrit 39.3, hemoglobin 13.5, MCV 84.2, platelets 410,000, white count 13,800 with an ANC of 10,300. Differential was unremarkable.  Comprehensive metabolic panel was normal.  ESR was 42.  CRP was < 0.80.  Ferritin was 34.  Iron studies were normal with an iron saturation of 18% and a TIBC of 360 (normal).  BCR-ABL was negative.  JAK2 is pending.  Symptomatically, she notes it hurts to lift things.  Her bones ache.  Her muscles are weak.  Muscles are "worse on the left side".  Symptoms started in June.  Aleeve helps.   Past Medical History:  Diagnosis Date  . Anxiety   . Depression   . Insomnia   . Memory changes   . Sinus drainage   . Sleep apnea   . Thyroid disease     Past Surgical History:  Procedure Laterality Date  . LAPAROSCOPIC GASTRIC BANDING    . TUBAL LIGATION      No family history on file.  Social History:  reports that she has been smoking Cigarettes.  She has been smoking about 0.50 packs per day. She does not have any smokeless tobacco history on file. She reports that she drinks alcohol. Her drug history is not on file.  She has smoked for 15 years.  The patient is alone today.  Allergies:  Allergies  Allergen Reactions  . Seroquel [Quetiapine Fumarate] Other (See Comments)    nightmares    Current Medications: Current Outpatient Prescriptions  Medication Sig Dispense Refill  . calcium carbonate (OSCAL) 1500 (600 Ca) MG TABS tablet Take by mouth 2 (two) times daily with a meal.    . levothyroxine (SYNTHROID, LEVOTHROID) 150 MCG tablet  Take by mouth.    . Multiple Vitamins-Minerals (MULTIVITAMIN WITH MINERALS) tablet Take 1 tablet by mouth daily.    Marland Kitchen rOPINIRole (REQUIP) 0.25 MG tablet Take by mouth.    . zolpidem (AMBIEN CR) 12.5 MG CR tablet Take 12.5 mg by mouth at bedtime as needed for sleep.     No current facility-administered medications for this visit.     Review of Systems:  GENERAL:  Fatigue x 10 years.  No fevers, sweats or weight loss. PERFORMANCE STATUS (ECOG):  1 HEENT:  Rare blurry vision.  No runny nose, sore throat, mouth sores or tenderness. Lungs: Shortness of breath on exertion.  No cough.  No hemoptysis.  CPAP x 1 month. Cardiac:  No chest pain, palpitations, orthopnea, or PND. GI:  Constipation.  Nausea, sometimes.  No vomiting, diarrhea, melena or hematochezia. GU:  No urgency, frequency, dysuria, or hematuria. Musculoskeletal:  Ankle, knee, elbow, and hand swelling.  Hurts to lift things.  Hurts down to bone.  Muscles weak. Extremities:  No pain or swelling. Skin:  No rashes or skin changes. Neuro:  Numbness and tingling in hands at night.  No headache, focal weakness, balance or coordination issues. Endocrine:  No diabetes, thyroid issues, hot flashes or night sweats. Psych:  No mood changes, depression or anxiety. Pain:  No focal pain. Review of systems:  All other systems reviewed and  found to be negative.  Physical Exam: Blood pressure 119/80, pulse 85, temperature (!) 96.6 F (35.9 C), temperature source Tympanic, resp. rate 18, weight 211 lb 10.3 oz (96 kg), last menstrual period 02/19/2016. GENERAL:  Well developed, well nourished, woman who appears younger than stated age.  She is sitting comfortably in the exam room in no acute distress. MENTAL STATUS:  Alert and oriented to person, place and time. HEAD:  Brown hair with blonde highlights pulled up.  Normocephalic, atraumatic, face symmetric, no Cushingoid features. EYES:  Blue eyes.  No conjunctivitis or scleral  icterus. NEUROLOGICAL: Unremarkable. PSYCH:  Appropriate.   No visits with results within 3 Day(s) from this visit.  Latest known visit with results is:  Appointment on 03/06/2016  Component Date Value Ref Range Status  . WBC 03/06/2016 13.8* 3.6 - 11.0 K/uL Final  . RBC 03/06/2016 4.67  3.80 - 5.20 MIL/uL Final  . Hemoglobin 03/06/2016 13.5  12.0 - 16.0 g/dL Final  . HCT 03/06/2016 39.3  35.0 - 47.0 % Final  . MCV 03/06/2016 84.2  80.0 - 100.0 fL Final  . MCH 03/06/2016 29.0  26.0 - 34.0 pg Final  . MCHC 03/06/2016 34.5  32.0 - 36.0 g/dL Final  . RDW 03/06/2016 12.7  11.5 - 14.5 % Final  . Platelets 03/06/2016 410  150 - 440 K/uL Final  . Neutrophils Relative % 03/06/2016 74  % Final  . Neutro Abs 03/06/2016 10.3* 1.4 - 6.5 K/uL Final  . Lymphocytes Relative 03/06/2016 17  % Final  . Lymphs Abs 03/06/2016 2.4  1.0 - 3.6 K/uL Final  . Monocytes Relative 03/06/2016 6  % Final  . Monocytes Absolute 03/06/2016 0.8  0.2 - 0.9 K/uL Final  . Eosinophils Relative 03/06/2016 2  % Final  . Eosinophils Absolute 03/06/2016 0.3  0 - 0.7 K/uL Final  . Basophils Relative 03/06/2016 1  % Final  . Basophils Absolute 03/06/2016 0.1  0 - 0.1 K/uL Final  . Sodium 03/06/2016 138  135 - 145 mmol/L Final  . Potassium 03/06/2016 4.0  3.5 - 5.1 mmol/L Final  . Chloride 03/06/2016 103  101 - 111 mmol/L Final  . CO2 03/06/2016 26  22 - 32 mmol/L Final  . Glucose, Bld 03/06/2016 97  65 - 99 mg/dL Final  . BUN 03/06/2016 12  6 - 20 mg/dL Final  . Creatinine, Ser 03/06/2016 0.77  0.44 - 1.00 mg/dL Final  . Calcium 03/06/2016 9.8  8.9 - 10.3 mg/dL Final  . Total Protein 03/06/2016 7.7  6.5 - 8.1 g/dL Final  . Albumin 03/06/2016 4.0  3.5 - 5.0 g/dL Final  . AST 03/06/2016 30  15 - 41 U/L Final  . ALT 03/06/2016 33  14 - 54 U/L Final  . Alkaline Phosphatase 03/06/2016 55  38 - 126 U/L Final  . Total Bilirubin 03/06/2016 0.4  0.3 - 1.2 mg/dL Final  . GFR calc non Af Amer 03/06/2016 >60  >60 mL/min Final  .  GFR calc Af Amer 03/06/2016 >60  >60 mL/min Final   Comment: (NOTE) The eGFR has been calculated using the CKD EPI equation. This calculation has not been validated in all clinical situations. eGFR's persistently <60 mL/min signify possible Chronic Kidney Disease.   . Anion gap 03/06/2016 9  5 - 15 Final  . Sed Rate 03/06/2016 42* 0 - 20 mm/hr Final  . CRP 03/06/2016 <0.80  <1.0 mg/dL Final  . Ferritin 03/06/2016 34  11 - 307 ng/mL Final  .  Iron 03/06/2016 65  28 - 170 ug/dL Final  . TIBC 03/06/2016 360  250 - 450 ug/dL Final  . Saturation Ratios 03/06/2016 18  10.4 - 31.8 % Final  . UIBC 03/06/2016 295  ug/dL Final  . Specimen Type 03/10/2016 BLOOD   Final  . Cells Counted 03/10/2016 200   Final  . Cells Analyzed 03/10/2016 200   Final  . FISH Result 03/10/2016 Comment:   Final  . Interpretation 03/10/2016 Comment:   Final   Comment: (NOTE) .nuc ish 9q34(ASS1,ABL1)x2,22q11.2(BCRx2)[200].      The fluorescence in situ hybridization (FISH) study was normal. FISH, using unique sequence DNA probes for the ABL1 and BCR gene regions showed two ABL1 signals (red), two control ASS1  gene signals (aqua) located adjacent to the ABL1 locus at 9q34, and two BCR signals (green) at 22q11.2 in all interphase nuclei examined. There was NO evidence of CML or ALL-associated BCR/ABL1 dual fusion signals in this analysis.  .      This analysis is limited to abnormalities detectable by the specific probes included in the study. FISH results should be interpreted within the context of a full cytogenetic analysis and pathology evaluation.  Marland Kitchen  .This test was developed and its performance characteristics determined by [employer]). It has not been cleared or approved by the U.S. Food and Drug Administration. The DNA probe vendor for this study was [employer] research (physical sciences)).   . Director Review: 03/10/2016 Comment:   Final   Comment: (NOTE) M. Sinclair Grooms, PhD, [facility] Performed At: [facility] [place], [place] [id number] Nechama Guard MD YB:[id number]     Assessment:  Stephanie Larson is a 45 y.o. female with probable reactive leukocytosis.  WBC has ranged between 12,400 - 14,000 without trend.  Differential reveals predominant neutrophils.  She has no issues with infections.  She has not been on steroids or received steroid injections.  She smokes.  Work-up on 03/06/2016 revealed a hematocrit 39.3, hemoglobin 13.5, MCV 84.2, platelets 410,000, white count 13,800 with an ANC of 10,300.  Differential was unremarkable.  Comprehensive metabolic panel was normal.  ESR was 42.  CRP was < 0.80.  Ferritin was 34.  Iron studies were normal with an iron saturation of 18% and a TIBC of 360 (normal).  BCR-ABL was negative.  JAK2 is pending.  She has mild thrombocytosis.  Platelet count has increased from 403,000 in 07/2014 to 539,000 in 11/2015.  Current platelet count is 410,000.  Symptomatically, she notes fatigue x 10 years.  She has a 6 month history of ankle, knee, and elbow swelling. She notes that her muscles are weak and it "hurts down to the bones".    Plan: 1.  Review work-up.  Labs suggests a reactive process.  Follow-up pending JAK2 testing (doubt positive).  Discuss possible rheumatologic problem. 2.  Labs today:  ANA, RF, CK. 3.  Follow-up JAK2 testing.  Contact patient if positive. 4.  Consult rheumatology. 5.  RTC prn.   Lequita Asal, MD  03/13/2016, 2:45 PM

## 2016-03-13 NOTE — Progress Notes (Signed)
Patient here today for lab results. 

## 2016-03-14 ENCOUNTER — Encounter: Payer: Self-pay | Admitting: Hematology and Oncology

## 2016-03-14 LAB — RHEUMATOID FACTOR: Rhuematoid fact SerPl-aCnc: <10 IU/mL (ref 0.0–13.9)

## 2016-03-14 LAB — ANA W/REFLEX: Anti Nuclear Antibody(ANA): NEGATIVE

## 2016-03-28 LAB — JAK2 EXONS 12-15

## 2016-03-28 LAB — JAK2  V617F QUAL. WITH REFLEX TO EXON 12: Reflex:: 15

## 2016-03-29 DIAGNOSIS — D72825 Bandemia: Secondary | ICD-10-CM | POA: Insufficient documentation

## 2017-01-04 ENCOUNTER — Ambulatory Visit
Admission: RE | Admit: 2017-01-04 | Discharge: 2017-01-04 | Disposition: A | Discharge status: Home / Self Care | Payer: Self-pay | Source: Ambulatory Visit | Attending: Nurse Practitioner | Admitting: Nurse Practitioner

## 2017-01-04 ENCOUNTER — Other Ambulatory Visit: Payer: Self-pay | Admitting: Nurse Practitioner

## 2017-01-04 DIAGNOSIS — M19011 Primary osteoarthritis, right shoulder: Secondary | ICD-10-CM | POA: Insufficient documentation

## 2017-01-04 DIAGNOSIS — M5030 Other cervical disc degeneration, unspecified cervical region: Secondary | ICD-10-CM | POA: Insufficient documentation

## 2017-01-04 DIAGNOSIS — R52 Pain, unspecified: Secondary | ICD-10-CM

## 2017-01-04 DIAGNOSIS — M2578 Osteophyte, vertebrae: Secondary | ICD-10-CM | POA: Insufficient documentation

## 2017-01-04 DIAGNOSIS — M542 Cervicalgia: Secondary | ICD-10-CM | POA: Insufficient documentation

## 2017-01-30 DIAGNOSIS — M5412 Radiculopathy, cervical region: Secondary | ICD-10-CM | POA: Insufficient documentation

## 2017-05-04 ENCOUNTER — Other Ambulatory Visit: Payer: Self-pay | Admitting: Internal Medicine

## 2017-05-04 ENCOUNTER — Other Ambulatory Visit: Payer: Self-pay | Admitting: Nurse Practitioner

## 2017-05-04 DIAGNOSIS — M792 Neuralgia and neuritis, unspecified: Secondary | ICD-10-CM

## 2017-05-04 MED ORDER — GABAPENTIN 300 MG PO CAPS: 300.0000 mg | ORAL_CAPSULE | Freq: Two times a day (BID) | ORAL | 3 refills | Status: DC

## 2017-05-04 NOTE — Progress Notes (Signed)
Renewed patient's rx for gabapentin 300mg  twice daily with 3 refills.

## 2017-05-04 NOTE — Telephone Encounter (Signed)
Renewed patient's rx for gabapentin 300mg  twice daily with 3 refills.

## 2017-05-04 NOTE — Telephone Encounter (Signed)
Can pt get refill on this.  dbs

## 2017-05-14 NOTE — Telephone Encounter (Signed)
On 05/04/2017, I sent this new rx to her phaarmacy with 3 refills. I don't know why there is another request .

## 2017-05-15 ENCOUNTER — Ambulatory Visit: Payer: Self-pay | Admitting: Nurse Practitioner

## 2017-05-29 ENCOUNTER — Other Ambulatory Visit: Payer: Self-pay

## 2017-05-29 MED ORDER — HYDROXYZINE PAMOATE 50 MG PO CAPS: 50.0000 mg | ORAL_CAPSULE | Freq: Three times a day (TID) | ORAL | 2 refills | Status: DC | PRN

## 2017-07-16 ENCOUNTER — Other Ambulatory Visit: Payer: Self-pay

## 2017-07-16 MED ORDER — HYDROXYZINE HCL 50 MG PO TABS: 50.0000 mg | ORAL_TABLET | Freq: Three times a day (TID) | ORAL | 2 refills | Status: DC | PRN

## 2017-07-31 ENCOUNTER — Other Ambulatory Visit: Payer: Self-pay

## 2017-07-31 ENCOUNTER — Telehealth: Payer: Self-pay

## 2017-07-31 ENCOUNTER — Encounter: Payer: Self-pay | Admitting: Internal Medicine

## 2017-07-31 ENCOUNTER — Ambulatory Visit: Payer: Self-pay | Admitting: Internal Medicine

## 2017-07-31 VITALS — BP 128/92 | HR 89 | Temp 98.1°F | Resp 16 | Ht 66.0 in | Wt 216.2 lb

## 2017-07-31 DIAGNOSIS — B373 Candidiasis of vulva and vagina: Secondary | ICD-10-CM

## 2017-07-31 DIAGNOSIS — B3731 Acute candidiasis of vulva and vagina: Secondary | ICD-10-CM

## 2017-07-31 DIAGNOSIS — J0100 Acute maxillary sinusitis, unspecified: Secondary | ICD-10-CM

## 2017-07-31 DIAGNOSIS — J300 Vasomotor rhinitis: Secondary | ICD-10-CM

## 2017-07-31 DIAGNOSIS — M792 Neuralgia and neuritis, unspecified: Secondary | ICD-10-CM

## 2017-07-31 MED ORDER — FLUCONAZOLE 150 MG PO TABS: ORAL_TABLET | ORAL | 0 refills | Status: DC

## 2017-07-31 MED ORDER — PREDNISONE 10 MG PO TABS: ORAL_TABLET | ORAL | 0 refills | Status: DC

## 2017-07-31 MED ORDER — HYDROXYZINE HCL 50 MG PO TABS: 50.0000 mg | ORAL_TABLET | Freq: Three times a day (TID) | ORAL | 5 refills | Status: DC | PRN

## 2017-07-31 MED ORDER — GABAPENTIN 300 MG PO CAPS: 300.0000 mg | ORAL_CAPSULE | Freq: Two times a day (BID) | ORAL | 5 refills | Status: DC

## 2017-07-31 MED ORDER — ZOLPIDEM TARTRATE ER 12.5 MG PO TBCR: 12.5000 mg | EXTENDED_RELEASE_TABLET | Freq: Every evening | ORAL | 5 refills | Status: DC | PRN

## 2017-07-31 MED ORDER — AMOXICILLIN-POT CLAVULANATE 875-125 MG PO TABS: 1.0000 | ORAL_TABLET | Freq: Two times a day (BID) | ORAL | 0 refills | Status: DC

## 2017-07-31 MED ORDER — ROPINIROLE HCL 0.5 MG PO TABS: 0.5000 mg | ORAL_TABLET | Freq: Every day | ORAL | 5 refills | Status: DC

## 2017-07-31 MED ORDER — LEVOTHYROXINE SODIUM 200 MCG PO TABS: 200.0000 ug | ORAL_TABLET | Freq: Every day | ORAL | 5 refills | Status: DC

## 2017-07-31 NOTE — Progress Notes (Signed)
[facility] [place], [place] [address]  Internal MEDICINE  Office Visit Note  Patient Name: Stephanie Larson  [id number]  [id number]  Date of Service: 08/24/2017  Chief Complaint  Patient presents with  . Sinusitis  . Sore Throat  . Headache  . Cough     HPI Pt is here for a sick visit. She has been having cough, congestion and fever for few days, feels tired and fatigued. Sinus pressure and headaches.   Current Medication:  Outpatient Encounter Medications as of 07/31/2017  Medication Sig Note  . gabapentin (NEURONTIN) 300 MG capsule TAKE ONE CAPSULE BY MOUTH TWO TIMES A DAY   . [DISCONTINUED] hydrOXYzine (ATARAX/VISTARIL) 50 MG tablet Take 1 tablet (50 mg total) by mouth 3 (three) times daily as needed.   . [DISCONTINUED] levothyroxine (SYNTHROID, LEVOTHROID) 200 MCG tablet Take 200 mcg by mouth daily before breakfast.   . [DISCONTINUED] rOPINIRole (REQUIP) 0.25 MG tablet Take by mouth. 03/06/2016: Received from: [facility]: Take 0.25 mg by mouth 2 (two) times daily.  . [DISCONTINUED] rOPINIRole (REQUIP) 0.5 MG tablet Take 0.5 mg by mouth at bedtime.   . [DISCONTINUED] zolpidem (AMBIEN CR) 12.5 MG CR tablet Take 12.5 mg by mouth at bedtime as needed for sleep.   Marland Kitchen amoxicillin-clavulanate (AUGMENTIN) 875-125 MG tablet Take 1 tablet by mouth 2 (two) times daily.   . calcium carbonate (OSCAL) 1500 (600 Ca) MG TABS tablet Take by mouth 2 (two) times daily with a meal.   . fluconazole (DIFLUCAN) 150 MG tablet Take one tab a day as needed 3 times   . Multiple Vitamins-Minerals (MULTIVITAMIN WITH MINERALS) tablet Take 1 tablet by mouth daily.   . predniSONE (DELTASONE) 10 MG tablet Take one tab 2 x days   . [DISCONTINUED] gabapentin (NEURONTIN) 300 MG capsule Take 1 capsule (300 mg total) by mouth 2 (two) times daily.   . [DISCONTINUED] levothyroxine (SYNTHROID, LEVOTHROID) 150 MCG tablet Take by mouth. 03/06/2016: Received from: [place]: Take 150 mcg by mouth once daily. Take on an empty stomach with a glass of water at least 30-60 minutes before breakfast.   No facility-administered encounter medications on file as of 07/31/2017.       Medical History: Past Medical History:  Diagnosis Date  . Anxiety   . Depression   . Insomnia   . Memory changes   . Sinus drainage   . Sleep apnea   . Thyroid disease      Vital Signs: BP (!) 128/92   Pulse 89   Temp 98.1 F (36.7 C) (Oral)   Resp 16   Ht 5\' 6"  (1.676 m)   Wt 216 lb 3.2 oz (98.1 kg)   SpO2 97%   BMI 34.90 kg/m    Review of Systems  Constitutional: Positive for fatigue. Negative for chills and diaphoresis.  HENT: Positive for ear pain, postnasal drip, sinus pressure and sore throat.   Eyes: Positive for visual disturbance. Negative for redness.  Respiratory: Positive for cough and shortness of breath. Negative for wheezing.   Cardiovascular: Negative for chest pain.  Gastrointestinal: Negative for abdominal pain, nausea and vomiting.  Genitourinary: Negative for dysuria.  Musculoskeletal: Positive for myalgias. Negative for joint swelling.  Skin: Negative for color change.  Neurological: Positive for headaches.  Psychiatric/Behavioral: Negative for agitation, behavioral problems (depression) and hallucinations.    Physical Exam  Constitutional: She is oriented to person, place, and time. She appears  well-developed and well-nourished. No distress.  HENT:  Head: Normocephalic and atraumatic.  Nose: Mucosal edema and sinus tenderness present. Right sinus exhibits maxillary sinus tenderness. Right sinus exhibits no frontal sinus tenderness. Left sinus exhibits maxillary sinus tenderness.  Mouth/Throat: Oropharyngeal exudate present.  Eyes: Pupils are equal, round, and reactive to light. EOM are normal. Right eye exhibits no discharge.  Neck: Normal range of motion. Neck supple. No JVD present. No tracheal deviation  present. No thyromegaly present.  Cardiovascular: Normal rate and regular rhythm.  No murmur heard. Pulmonary/Chest: Effort normal. She has wheezes.  Lymphadenopathy:    She has no cervical adenopathy.  Neurological: She is oriented to person, place, and time.  Skin: She is not diaphoretic.   Assessment/Plan: 1. Acute non-recurrent maxillary sinusitis - amoxicillin-clavulanate (AUGMENTIN) 875-125 MG tablet; Take 1 tablet by mouth 2 (two) times daily.  Dispense: 20 tablet; Refill: 0  2. Nonallergic vasomotor rhinitis - predniSONE (DELTASONE) 10 MG tablet; Take one tab 2 x days  Dispense: 6 tablet; Refill: 0  3. Vaginitis due to Candida - Associated with abx use  - fluconazole (DIFLUCAN) 150 MG tablet; Take one tab a day as needed 3 times  Dispense: 3 tablet; Refill: 0  General Counseling: Stephanie Larson verbalizes understanding of the findings of todays visit and agrees with plan of treatment. I have discussed any further diagnostic evaluation that may be needed or ordered today. We also reviewed her medications today. she has been encouraged to call the office with any questions or concerns that should arise related to todays visit   Meds ordered this encounter  Medications  . amoxicillin-clavulanate (AUGMENTIN) 875-125 MG tablet    Sig: Take 1 tablet by mouth 2 (two) times daily.    Dispense:  20 tablet    Refill:  0  . fluconazole (DIFLUCAN) 150 MG tablet    Sig: Take one tab a day as needed 3 times    Dispense:  3 tablet    Refill:  0  . predniSONE (DELTASONE) 10 MG tablet    Sig: Take one tab 2 x days    Dispense:  6 tablet    Refill:  0    Time spent:25 Minutes

## 2017-07-31 NOTE — Telephone Encounter (Signed)
Called ambien 12.5 CR into pharmacy.  #30 w/ 5 refills.  dbs

## 2017-08-02 NOTE — Telephone Encounter (Signed)
SENT MESSAGE TO Stephanie Larson

## 2018-01-29 ENCOUNTER — Other Ambulatory Visit: Payer: Self-pay | Admitting: Internal Medicine

## 2018-01-30 ENCOUNTER — Ambulatory Visit: Payer: Self-pay | Admitting: Adult Health

## 2018-01-30 ENCOUNTER — Encounter: Payer: Self-pay | Admitting: Adult Health

## 2018-01-30 VITALS — BP 117/86 | HR 100 | Resp 16 | Ht 66.0 in | Wt 218.6 lb

## 2018-01-30 DIAGNOSIS — F409 Phobic anxiety disorder, unspecified: Secondary | ICD-10-CM

## 2018-01-30 DIAGNOSIS — E039 Hypothyroidism, unspecified: Secondary | ICD-10-CM

## 2018-01-30 DIAGNOSIS — F411 Generalized anxiety disorder: Secondary | ICD-10-CM

## 2018-01-30 DIAGNOSIS — F5105 Insomnia due to other mental disorder: Secondary | ICD-10-CM

## 2018-01-30 DIAGNOSIS — G2581 Restless legs syndrome: Secondary | ICD-10-CM

## 2018-01-30 DIAGNOSIS — M792 Neuralgia and neuritis, unspecified: Secondary | ICD-10-CM

## 2018-01-30 MED ORDER — GABAPENTIN 300 MG PO CAPS: 300.0000 mg | ORAL_CAPSULE | Freq: Two times a day (BID) | ORAL | 5 refills | Status: DC

## 2018-01-30 MED ORDER — ZOLPIDEM TARTRATE ER 12.5 MG PO TBCR: 12.5000 mg | EXTENDED_RELEASE_TABLET | Freq: Every evening | ORAL | 5 refills | Status: DC | PRN

## 2018-01-30 MED ORDER — LEVOTHYROXINE SODIUM 200 MCG PO TABS: 200.0000 ug | ORAL_TABLET | Freq: Every day | ORAL | 5 refills | Status: DC

## 2018-01-30 MED ORDER — HYDROXYZINE HCL 50 MG PO TABS: 50.0000 mg | ORAL_TABLET | Freq: Three times a day (TID) | ORAL | 5 refills | Status: DC | PRN

## 2018-01-30 MED ORDER — ROPINIROLE HCL 0.5 MG PO TABS: 0.5000 mg | ORAL_TABLET | Freq: Every day | ORAL | 5 refills | Status: DC

## 2018-01-30 NOTE — Patient Instructions (Signed)

## 2018-01-30 NOTE — Telephone Encounter (Signed)
Last  Here 4/19 no next

## 2018-01-30 NOTE — Progress Notes (Addendum)
[facility] [place], [place] [address]  Internal MEDICINE  Office Visit Note  Patient Name: Stephanie Larson  [id number]  [id number]  Date of Service: 02/04/2018  Chief Complaint  Patient presents with  . Medical Management of Chronic Issues    medication refill    HPI Pt here for follow up on Anxiety, Neuralgia, Hypothyroid, Restless leg syndrome, and insomnia.  She is requesting refills on her chronic medications.  She reports good results and control with current medication.  She denies further issues or complaints at this time.  She reports smoking 1/2 pack of cigarettes a day.  She drinks alcohol socially, and denies street drug use, but reports using CBD oil.    Current Medication: Outpatient Encounter Medications as of 01/30/2018  Medication Sig  . gabapentin (NEURONTIN) 300 MG capsule Take 1 capsule (300 mg total) by mouth 2 (two) times daily.  . hydrOXYzine (ATARAX/VISTARIL) 50 MG tablet Take 1 tablet (50 mg total) by mouth 3 (three) times daily as needed.  Marland Kitchen levothyroxine (SYNTHROID, LEVOTHROID) 200 MCG tablet Take 1 tablet (200 mcg total) by mouth daily before breakfast.  . rOPINIRole (REQUIP) 0.5 MG tablet Take 1 tablet (0.5 mg total) by mouth at bedtime.  Marland Kitchen zolpidem (AMBIEN CR) 12.5 MG CR tablet Take 1 tablet (12.5 mg total) by mouth at bedtime as needed for sleep.  . [DISCONTINUED] gabapentin (NEURONTIN) 300 MG capsule Take 1 capsule (300 mg total) by mouth 2 (two) times daily.  . [DISCONTINUED] hydrOXYzine (ATARAX/VISTARIL) 50 MG tablet Take 1 tablet (50 mg total) by mouth 3 (three) times daily as needed.  . [DISCONTINUED] levothyroxine (SYNTHROID, LEVOTHROID) 200 MCG tablet Take 1 tablet (200 mcg total) by mouth daily before breakfast.  . [DISCONTINUED] rOPINIRole (REQUIP) 0.5 MG tablet Take 1 tablet (0.5 mg total) by mouth at bedtime.  . [DISCONTINUED] zolpidem (AMBIEN CR) 12.5 MG CR tablet Take 1 tablet (12.5 mg total) by mouth at bedtime as needed  for sleep.  Marland Kitchen amoxicillin-clavulanate (AUGMENTIN) 875-125 MG tablet Take 1 tablet by mouth 2 (two) times daily. (Patient not taking: Reported on 01/30/2018)  . Carisoprodol (SOMA PO) carisoprodol  . fluconazole (DIFLUCAN) 150 MG tablet Take one tab a day as needed 3 times (Patient not taking: Reported on 01/30/2018)  . Multiple Vitamins-Minerals (MULTIVITAMIN WITH MINERALS) tablet Take 1 tablet by mouth daily.  . predniSONE (DELTASONE) 10 MG tablet Take one tab 2 x days (Patient not taking: Reported on 01/30/2018)  . traMADol (ULTRAM) 50 MG tablet tramadol 50 mg tablet  Take 1 tablet every 4 hours by oral route.  . [DISCONTINUED] calcium carbonate (CALCIUM 600) 600 MG TABS tablet Take by mouth.  . [DISCONTINUED] calcium carbonate (OSCAL) 1500 (600 Ca) MG TABS tablet Take by mouth 2 (two) times daily with a meal.  . [DISCONTINUED] gabapentin (NEURONTIN) 300 MG capsule TAKE ONE CAPSULE BY MOUTH TWO TIMES A DAY (Patient not taking: Reported on 01/30/2018)   No facility-administered encounter medications on file as of 01/30/2018.     Surgical History: Past Surgical History:  Procedure Laterality Date  . LAPAROSCOPIC GASTRIC BANDING    . TUBAL LIGATION      Medical History: Past Medical History:  Diagnosis Date  . Anxiety   . Depression   . Insomnia   . Memory changes   . Sinus drainage   . Sleep apnea   . Thyroid disease     Family History: Family History  Family history unknown: Yes    Social  History   Socioeconomic History  . Marital status: Married    Spouse name: Not on file  . Number of children: Not on file  . Years of education: Not on file  . Highest education level: Not on file  Occupational History  . Not on file  Social Needs  . Financial resource strain: Not on file  . Food insecurity:    Worry: Not on file    Inability: Not on file  . Transportation needs:    Medical: Not on file    Non-medical: Not on file  Tobacco Use  . Smoking status: Current Every  Day Smoker    Packs/day: 0.50    Types: Cigarettes  . Smokeless tobacco: Never Used  Substance and Sexual Activity  . Alcohol use: Not Currently  . Drug use: Not on file  . Sexual activity: Not on file  Lifestyle  . Physical activity:    Days per week: Not on file    Minutes per session: Not on file  . Stress: Not on file  Relationships  . Social connections:    Talks on phone: Not on file    Gets together: Not on file    Attends religious service: Not on file    Active member of club or organization: Not on file    Attends meetings of clubs or organizations: Not on file    Relationship status: Not on file  . Intimate partner violence:    Fear of current or ex partner: Not on file    Emotionally abused: Not on file    Physically abused: Not on file    Forced sexual activity: Not on file  Other Topics Concern  . Not on file  Social History Narrative  . Not on file      Review of Systems  Constitutional: Negative for chills, fatigue and unexpected weight change.  HENT: Negative for congestion, rhinorrhea, sneezing and sore throat.   Eyes: Negative for photophobia, pain and redness.  Respiratory: Negative for cough, chest tightness and shortness of breath.   Cardiovascular: Negative for chest pain and palpitations.  Gastrointestinal: Negative for abdominal pain, constipation, diarrhea, nausea and vomiting.  Endocrine: Negative.   Genitourinary: Negative for dysuria and frequency.  Musculoskeletal: Negative for arthralgias, back pain, joint swelling and neck pain.  Skin: Negative for rash.  Allergic/Immunologic: Negative.   Neurological: Negative for tremors and numbness.  Hematological: Negative for adenopathy. Does not bruise/bleed easily.  Psychiatric/Behavioral: Negative for behavioral problems and sleep disturbance. The patient is not nervous/anxious.     Vital Signs: BP 117/86 (BP Location: Right Arm, Patient Position: Sitting, Cuff Size: Large)   Pulse 100    Resp 16   Ht 5\' 6"  (1.676 m)   Wt 218 lb 9.6 oz (99.2 kg)   SpO2 98%   BMI 35.28 kg/m    Physical Exam  Constitutional: She is oriented to person, place, and time. She appears well-developed and well-nourished. No distress.  HENT:  Head: Normocephalic and atraumatic.  Mouth/Throat: Oropharynx is clear and moist. No oropharyngeal exudate.  Eyes: Pupils are equal, round, and reactive to light. EOM are normal.  Neck: Normal range of motion. Neck supple. No JVD present. No tracheal deviation present. No thyromegaly present.  Cardiovascular: Normal rate, regular rhythm and normal heart sounds. Exam reveals no gallop and no friction rub.  No murmur heard. Pulmonary/Chest: Effort normal and breath sounds normal. No respiratory distress. She has no wheezes. She has no rales. She exhibits no tenderness.  Abdominal: Soft. There is no tenderness. There is no guarding.  Musculoskeletal: Normal range of motion.  Lymphadenopathy:    She has no cervical adenopathy.  Neurological: She is alert and oriented to person, place, and time. No cranial nerve deficit.  Skin: Skin is warm and dry. She is not diaphoretic.  Psychiatric: She has a normal mood and affect. Her behavior is normal. Judgment and thought content normal.  Nursing note and vitals reviewed.  Assessment/Plan: 1. GAD (generalized anxiety disorder) She reports good control of anxiety with Atarax. Continue current therapy.  - hydrOXYzine (ATARAX/VISTARIL) 50 MG tablet; Take 1 tablet (50 mg total) by mouth 3 (three) times daily as needed.  Dispense: 90 tablet; Refill:   Reviewed risks and possible side effects associated with taking opiates, benzodiazepines and other CNS depressants. Combination of these could cause dizziness and drowsiness. Advised patient not to drive or operate machinery when taking these medications, as patient's and other's life can be at risk and will have consequences. Patient verbalized understanding in this matter.  Dependence and abuse for these drugs will be monitored closely. A Controlled substance policy and procedure is on file which allows [facility] to order a urine drug screen test at any visit. Patient understands and agrees with the plan  2. Neuralgia and neuritis, unspecified She uses gabapentin daily for neuralgia.  Continue current therapy.  - gabapentin (NEURONTIN) 300 MG capsule; Take 1 capsule (300 mg total) by mouth 2 (two) times daily.  Dispense: 60 capsule; Refill: 5  3. Hypothyroidism, unspecified type Refill synthroid. Will monitor TSh at physical. - levothyroxine (SYNTHROID, LEVOTHROID) 200 MCG tablet; Take 1 tablet (200 mcg total) by mouth daily before breakfast.  Dispense: 30 tablet; Refill: 5  4. Restless leg syndrome Refilled Rquip.  Continue as directed.  - rOPINIRole (REQUIP) 0.5 MG tablet; Take 1 tablet (0.5 mg total) by mouth at bedtime.  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  Continue as prescribed.  - zolpidem (AMBIEN CR) 12.5 MG CR tablet; Take 1 tablet (12.5 mg total) by mouth at bedtime as needed for sleep.  Dispense: 30 tablet; Refill: 5  General Counseling: Sakai verbalizes understanding of the findings of todays visit and agrees with plan of treatment. I have discussed any further diagnostic evaluation that may be needed or ordered today. We also reviewed her medications today. she has been encouraged to call the office with any questions or concerns that should arise related to todays visit.  Meds ordered this encounter  Medications  . gabapentin (NEURONTIN) 300 MG capsule    Sig: Take 1 capsule (300 mg total) by mouth 2 (two) times daily.    Dispense:  60 capsule    Refill:  5  . hydrOXYzine (ATARAX/VISTARIL) 50 MG tablet    Sig: Take 1 tablet (50 mg total) by mouth 3 (three) times daily as needed.    Dispense:  90 tablet    Refill:  5  . levothyroxine (SYNTHROID, LEVOTHROID) 200 MCG  tablet    Sig: Take 1 tablet (200 mcg total) by mouth daily before breakfast.    Dispense:  30 tablet    Refill:  5  . rOPINIRole (REQUIP) 0.5 MG tablet    Sig: Take 1 tablet (0.5 mg total) by mouth at bedtime.    Dispense:  30 tablet    Refill:  5  . zolpidem (AMBIEN CR) 12.5 MG CR tablet    Sig: Take  1 tablet (12.5 mg total) by mouth at bedtime as needed for sleep.    Dispense:  30 tablet    Refill:  5    Time spent: 25 Minutes   This patient was seen by Orson Gear AGNP-C in Collaboration with Dr Lavera Guise as a part of collaborative care agreement    Orson Gear [facility] Internal medicine

## 2018-02-26 ENCOUNTER — Other Ambulatory Visit: Payer: Self-pay | Admitting: Nurse Practitioner

## 2018-02-27 NOTE — Telephone Encounter (Signed)
Can you check ?

## 2018-03-01 ENCOUNTER — Other Ambulatory Visit: Payer: Self-pay

## 2018-03-01 ENCOUNTER — Telehealth: Payer: Self-pay

## 2018-03-05 NOTE — Telephone Encounter (Signed)
Called pt for the third time to confirm she is taking gabapentin and was unable to reach pt. Left a voicemail for the pt to call back to confirm.

## 2018-04-12 ENCOUNTER — Encounter: Payer: Self-pay | Admitting: Nurse Practitioner

## 2018-04-12 ENCOUNTER — Ambulatory Visit: Payer: Self-pay | Admitting: Nurse Practitioner

## 2018-04-12 VITALS — BP 125/95 | HR 91 | Temp 95.5°F | Resp 16 | Ht 66.0 in | Wt 225.2 lb

## 2018-04-12 DIAGNOSIS — M25511 Pain in right shoulder: Secondary | ICD-10-CM

## 2018-04-12 DIAGNOSIS — E039 Hypothyroidism, unspecified: Secondary | ICD-10-CM | POA: Insufficient documentation

## 2018-04-12 DIAGNOSIS — R110 Nausea: Secondary | ICD-10-CM

## 2018-04-12 DIAGNOSIS — R300 Dysuria: Secondary | ICD-10-CM

## 2018-04-12 DIAGNOSIS — J0140 Acute pansinusitis, unspecified: Secondary | ICD-10-CM | POA: Insufficient documentation

## 2018-04-12 DIAGNOSIS — E559 Vitamin D deficiency, unspecified: Secondary | ICD-10-CM

## 2018-04-12 LAB — POCT URINALYSIS DIPSTICK
BILIRUBIN UA: NEGATIVE
Glucose, UA: NEGATIVE
LEUKOCYTES UA: NEGATIVE
NITRITE UA: NEGATIVE
PH UA: 6 (ref 5.0–8.0)
PROTEIN UA: POSITIVE — AB
RBC UA: NEGATIVE
Spec Grav, UA: 1.025 (ref 1.010–1.025)
UROBILINOGEN UA: 0.2 U/dL

## 2018-04-12 MED ORDER — PROMETHAZINE HCL 25 MG PO TABS: ORAL_TABLET | ORAL | 0 refills | Status: DC

## 2018-04-12 MED ORDER — SULFAMETHOXAZOLE-TRIMETHOPRIM 800-160 MG PO TABS: 1.0000 | ORAL_TABLET | Freq: Two times a day (BID) | ORAL | 0 refills | Status: DC

## 2018-04-12 MED ORDER — PREDNISONE 10 MG (21) PO TBPK: ORAL_TABLET | ORAL | 0 refills | Status: DC

## 2018-04-12 NOTE — Progress Notes (Signed)
[facility] [place], [place] [address]  Internal MEDICINE  Office Visit Note  Patient Name: Stephanie Larson  [id number]  [id number]  Date of Service: 04/12/2018   Pt is here for a sick visit.  Chief Complaint  Patient presents with  . Dizziness    pt had a black out in august where she was coming from the bathroom and passed out in front of the dresser in her room. no chest pains or shortness of breath  . Nasal Congestion  . Nausea    and body aches, started Sunday, no fever or chills, pt states she feels weak  . Pain    hands and feet tingling.      The patient is here for sick visit today. She has been dizzy and nauseated this week. Has pain and pressure behind her eyes. Feels like her eyes are "bulging out." she is also having episodes of significant nausea without vomiting or abdominal pain. She states that she has noted some discomfort with urination and strong smell to her urine.  She is also reporting worsening pain in the right shoulder. Has seen orthopedics in the past. 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 Got cortisone injections into the shoulder which did help, but has worn off. Pain has become worse over the past few weeks. This has reduced her ROM and strength of the arm.        Current Medication:  Outpatient Encounter Medications as of 04/12/2018  Medication Sig  . Carisoprodol (SOMA PO) carisoprodol  . gabapentin (NEURONTIN) 300 MG capsule Take 1 capsule (300 mg total) by mouth 2 (two) times daily.  . hydrOXYzine (ATARAX/VISTARIL) 50 MG tablet Take 1 tablet (50 mg total) by mouth 3 (three) times daily as needed.  Marland Kitchen levothyroxine (SYNTHROID, LEVOTHROID) 200 MCG tablet Take 1 tablet (200 mcg total) by mouth daily before breakfast.  . Multiple Vitamins-Minerals (MULTIVITAMIN WITH MINERALS) tablet Take 1 tablet by mouth daily.  Marland Kitchen rOPINIRole (REQUIP) 0.5 MG tablet Take 1 tablet (0.5 mg total) by mouth at bedtime.  .  traMADol (ULTRAM) 50 MG tablet tramadol 50 mg tablet  Take 1 tablet every 4 hours by oral route.  Marland Kitchen zolpidem (AMBIEN CR) 12.5 MG CR tablet Take 1 tablet (12.5 mg total) by mouth at bedtime as needed for sleep.  Marland Kitchen amoxicillin-clavulanate (AUGMENTIN) 875-125 MG tablet Take 1 tablet by mouth 2 (two) times daily. (Patient not taking: Reported on 01/30/2018)  . fluconazole (DIFLUCAN) 150 MG tablet Take one tab a day as needed 3 times (Patient not taking: Reported on 01/30/2018)  . predniSONE (STERAPRED UNI-PAK 21 TAB) 10 MG (21) TBPK tablet 6 day taper - take by mouth as directed for 6 days  . promethazine (PHENERGAN) 25 MG tablet Take 1/2 to 1 tablet po TID prn nausea  . sulfamethoxazole-trimethoprim (BACTRIM DS,SEPTRA DS) 800-160 MG tablet Take 1 tablet by mouth 2 (two) times daily.  . [DISCONTINUED] predniSONE (DELTASONE) 10 MG tablet Take one tab 2 x days (Patient not taking: Reported on 01/30/2018)   No facility-administered encounter medications on file as of 04/12/2018.       Medical History: Past Medical History:  Diagnosis Date  . Anxiety   . Depression   . Insomnia   . Memory changes   . Sinus drainage   . Sleep apnea   . Thyroid disease      Today's Vitals   04/12/18 1117  BP: (!) 125/95  Pulse: 91  Resp: 16  Temp: (!) 95.5 F (35.3 C)  SpO2: 98%  Weight: 225 lb 3.2 oz (102.2 kg)  Height: 5\' 6"  (1.676 m)    Review of Systems  Constitutional: Positive for activity change and fatigue. Negative for chills, fever and unexpected weight change.  HENT: Positive for congestion, postnasal drip, rhinorrhea and sinus pressure. Negative for sneezing and sore throat.   Respiratory: Positive for cough. Negative for chest tightness and shortness of breath.   Cardiovascular: Negative for chest pain and palpitations.  Gastrointestinal: Positive for nausea and vomiting. Negative for abdominal pain, constipation and diarrhea.  Endocrine: Negative for cold intolerance, heat intolerance,  polydipsia and polyuria.       Needs to have thyroid panel checked.   Genitourinary: Positive for dysuria and urgency. Negative for frequency.  Musculoskeletal: Positive for arthralgias. Negative for back pain, joint swelling and neck pain.       Right shoulder pain .  Skin: Negative for rash.  Allergic/Immunologic: Positive for environmental allergies.  Neurological: Positive for dizziness and headaches. Negative for tremors and numbness.  Hematological: Negative for adenopathy. Does not bruise/bleed easily.  Psychiatric/Behavioral: Positive for dysphoric mood. Negative for behavioral problems (Depression), sleep disturbance and suicidal ideas. The patient is nervous/anxious.     Physical Exam Vitals signs and nursing note reviewed.  Constitutional:      General: She is not in acute distress.    Appearance: Normal appearance. She is well-developed. She is ill-appearing. She is not diaphoretic.  HENT:     Head: Normocephalic and atraumatic.     Right Ear: External ear normal.     Left Ear: External ear normal.     Nose: Congestion and rhinorrhea present.     Comments: Sinus pressure around maxillary and frontal sinus areas.     Mouth/Throat:     Pharynx: No oropharyngeal exudate.  Eyes:     Pupils: Pupils are equal, round, and reactive to light.  Neck:     Musculoskeletal: Normal range of motion and neck supple.     Thyroid: No thyromegaly.     Vascular: No JVD.     Trachea: No tracheal deviation.  Cardiovascular:     Rate and Rhythm: Normal rate and regular rhythm.     Heart sounds: Normal heart sounds. No murmur. No friction rub. No gallop.   Pulmonary:     Effort: Pulmonary effort is normal. No respiratory distress.     Breath sounds: Normal breath sounds. No wheezing or rales.  Chest:     Chest wall: No tenderness.  Abdominal:     General: Bowel sounds are normal.     Palpations: Abdomen is soft.  Genitourinary:    Comments: Urine sample positive for protein and trace  ketones.  Musculoskeletal: Normal range of motion.        General: Tenderness present.     Comments: Moderate tenderness of right shoulder with reduced ROM and strength. No bony abnormalities or deformities can be palpated.   Lymphadenopathy:     Cervical: No cervical adenopathy.  Skin:    General: Skin is warm and dry.  Neurological:     General: No focal deficit present.     Mental Status: She is alert and oriented to person, place, and time.     Cranial Nerves: No cranial nerve deficit.  Psychiatric:        Mood and Affect: Mood normal.        Behavior: Behavior normal.        Thought Content: Thought  content normal.        Judgment: Judgment normal.   Assessment/Plan: 1. Acute non-recurrent pansinusitis Start bactrim ds bid for 10 days. Rest and increase fluids. Take OTC medication to alleviate symptoms.  - sulfamethoxazole-trimethoprim (BACTRIM DS,SEPTRA DS) 800-160 MG tablet; Take 1 tablet by mouth 2 (two) times daily.  Dispense: 20 tablet; Refill: 0  2. Nausea Promethazine 25mg  may be taken up to three times daily if needed for nausea. BRAT diet recommended.  - promethazine (PHENERGAN) 25 MG tablet; Take 1/2 to 1 tablet po TID prn nausea  Dispense: 20 tablet; Refill: 0  3. Acute pain of right shoulder Will treat with prednisone taper to reduce pain and inflammation. Take as directed for 6 days.  - predniSONE (STERAPRED UNI-PAK 21 TAB) 10 MG (21) TBPK tablet; 6 day taper - take by mouth as directed for 6 days  Dispense: 21 tablet; Refill: 0  4. Dysuria Urine positive for protein. Recommend increasing fluid intake. Will send for culture and sensitivity and treat as indicated . - POCT Urinalysis Dipstick - CULTURE, URINE COMPREHENSIVE  5. Hypothyroidism, unspecified type Lab slip given to check thyroid panel. Adjust levothytoxine dosing as indicated.   6. Vitamin D deficiency Check vitamin d level  General Counseling: Stephanie Larson verbalizes understanding of the findings of  todays visit and agrees with plan of treatment. I have discussed any further diagnostic evaluation that may be needed or ordered today. We also reviewed her medications today. she has been encouraged to call the office with any questions or concerns that should arise related to todays visit.    Counseling:  Rest and increase fluids. Continue using OTC medication to control symptoms.   This patient was seen by Leretha Pol FNP Collaboration with Dr Lavera Guise as a part of collaborative care agreement  Orders Placed This Encounter  Procedures  . CULTURE, URINE COMPREHENSIVE  . POCT Urinalysis Dipstick    Meds ordered this encounter  Medications  . sulfamethoxazole-trimethoprim (BACTRIM DS,SEPTRA DS) 800-160 MG tablet    Sig: Take 1 tablet by mouth 2 (two) times daily.    Dispense:  20 tablet    Refill:  0    Order Specific Question:   Supervising Provider    Answer:   Lavera Guise [[id number]]  . promethazine (PHENERGAN) 25 MG tablet    Sig: Take 1/2 to 1 tablet po TID prn nausea    Dispense:  20 tablet    Refill:  0    Order Specific Question:   Supervising Provider    Answer:   Lavera Guise [[id number]]  . predniSONE (STERAPRED UNI-PAK 21 TAB) 10 MG (21) TBPK tablet    Sig: 6 day taper - take by mouth as directed for 6 days    Dispense:  21 tablet    Refill:  0    Order Specific Question:   Supervising Provider    Answer:   Lavera Guise [[id number]]    Time spent: 25 Minutes

## 2018-04-15 NOTE — Addendum Note (Signed)
Addended by: Radene Knee on: 04/15/2018 01:36 PM   Modules accepted: Orders

## 2018-05-17 ENCOUNTER — Other Ambulatory Visit: Payer: Self-pay

## 2018-05-17 DIAGNOSIS — F411 Generalized anxiety disorder: Secondary | ICD-10-CM

## 2018-05-17 MED ORDER — HYDROXYZINE HCL 50 MG PO TABS: 50.0000 mg | ORAL_TABLET | Freq: Three times a day (TID) | ORAL | 5 refills | Status: DC | PRN

## 2018-07-31 ENCOUNTER — Other Ambulatory Visit: Payer: Self-pay | Admitting: Adult Health

## 2018-07-31 DIAGNOSIS — G2581 Restless legs syndrome: Secondary | ICD-10-CM

## 2018-07-31 DIAGNOSIS — E039 Hypothyroidism, unspecified: Secondary | ICD-10-CM

## 2018-07-31 DIAGNOSIS — F5105 Insomnia due to other mental disorder: Principal | ICD-10-CM

## 2018-07-31 DIAGNOSIS — F409 Phobic anxiety disorder, unspecified: Secondary | ICD-10-CM

## 2018-08-01 ENCOUNTER — Other Ambulatory Visit: Payer: Self-pay

## 2018-08-01 ENCOUNTER — Ambulatory Visit: Payer: Self-pay | Admitting: Nurse Practitioner

## 2018-08-01 ENCOUNTER — Ambulatory Visit: Payer: Self-pay | Admitting: Internal Medicine

## 2018-08-01 ENCOUNTER — Telehealth: Payer: Self-pay | Admitting: Adult Health

## 2018-08-01 ENCOUNTER — Other Ambulatory Visit: Payer: Self-pay | Admitting: Adult Health

## 2018-08-01 DIAGNOSIS — F411 Generalized anxiety disorder: Secondary | ICD-10-CM

## 2018-08-01 DIAGNOSIS — F5105 Insomnia due to other mental disorder: Secondary | ICD-10-CM

## 2018-08-01 DIAGNOSIS — F409 Phobic anxiety disorder, unspecified: Secondary | ICD-10-CM

## 2018-08-01 DIAGNOSIS — E039 Hypothyroidism, unspecified: Secondary | ICD-10-CM

## 2018-08-01 DIAGNOSIS — G2581 Restless legs syndrome: Secondary | ICD-10-CM

## 2018-08-01 DIAGNOSIS — M436 Torticollis: Secondary | ICD-10-CM

## 2018-08-01 MED ORDER — ZOLPIDEM TARTRATE ER 12.5 MG PO TBCR: 12.5000 mg | EXTENDED_RELEASE_TABLET | Freq: Every evening | ORAL | 1 refills | Status: DC | PRN

## 2018-08-01 MED ORDER — GABAPENTIN 300 MG PO CAPS: 300.0000 mg | ORAL_CAPSULE | Freq: Two times a day (BID) | ORAL | 1 refills | Status: DC

## 2018-08-01 MED ORDER — LEVOTHYROXINE SODIUM 200 MCG PO TABS: 200.0000 ug | ORAL_TABLET | Freq: Every day | ORAL | 1 refills | Status: DC

## 2018-08-01 MED ORDER — CYCLOBENZAPRINE HCL 10 MG PO TABS: ORAL_TABLET | ORAL | 0 refills | Status: DC

## 2018-08-01 NOTE — Progress Notes (Signed)
Pt is c/o muscle tightness in her neck  Pt needs refills on her medications  Pt had an eye infection

## 2018-08-01 NOTE — Progress Notes (Signed)
[facility] [place], [place] [address]  Internal MEDICINE  Telephone Visit  Patient Name: Stephanie Larson  [id number]  [id number]  Date of Service: 08/01/2018  I connected with the patient at 12.00 by telephone and verified the patients identity using two identifiers.   I discussed the limitations, risks, security and privacy concerns of performing an evaluation and management service by telephone and the availability of in person appointments. I also discussed with the patient that there may be a patient responsible charge related to the service.  The patient expressed understanding and agrees to proceed.    Chief Complaint  Patient presents with  . Neck Pain  . Medication Refill    HPI  Pt is c/o neck pain on the left side, unable to move her neck. Slight nasal congestion as well. No fever or chills.she will like get some refills. Staying at home, no close contact is sick at this time   Current Medication: Outpatient Encounter Medications as of 08/01/2018  Medication Sig  . cyclobenzaprine (FLEXERIL) 10 MG tablet Take one tab at bedtime prn  . fluconazole (DIFLUCAN) 150 MG tablet Take one tab a day as needed 3 times (Patient not taking: Reported on 01/30/2018)  . gabapentin (NEURONTIN) 300 MG capsule Take 1 capsule (300 mg total) by mouth 2 (two) times daily.  Marland Kitchen levothyroxine (SYNTHROID, LEVOTHROID) 200 MCG tablet Take 1 tablet (200 mcg total) by mouth daily before breakfast.  . Multiple Vitamins-Minerals (MULTIVITAMIN WITH MINERALS) tablet Take 1 tablet by mouth daily.  Marland Kitchen rOPINIRole (REQUIP) 0.5 MG tablet Take 1 tablet (0.5 mg total) by mouth at bedtime.  Marland Kitchen zolpidem (AMBIEN CR) 12.5 MG CR tablet Take 1 tablet (12.5 mg total) by mouth at bedtime as needed for sleep.  . [DISCONTINUED] zolpidem (AMBIEN CR) 12.5 MG CR tablet Take 1 tablet (12.5 mg total) by mouth at bedtime as needed for sleep.  . [DISCONTINUED] zolpidem (AMBIEN CR) 12.5 MG CR tablet Take 1 tablet  (12.5 mg total) by mouth at bedtime as needed for sleep.   No facility-administered encounter medications on file as of 08/01/2018.     Surgical History: Past Surgical History:  Procedure Laterality Date  . LAPAROSCOPIC GASTRIC BANDING    . TUBAL LIGATION      Medical History: Past Medical History:  Diagnosis Date  . Anxiety   . Depression   . Insomnia   . Memory changes   . Sinus drainage   . Sleep apnea   . Thyroid disease     Family History: Family History  Family history unknown: Yes    Social History   Socioeconomic History  . Marital status: Married    Spouse name: Not on file  . Number of children: Not on file  . Years of education: Not on file  . Highest education level: Not on file  Occupational History  . Not on file  Social Needs  . Financial resource strain: Not on file  . Food insecurity:    Worry: Not on file    Inability: Not on file  . Transportation needs:    Medical: Not on file    Non-medical: Not on file  Tobacco Use  . Smoking status: Current Every Day Smoker    Packs/day: 0.50    Types: Cigarettes  . Smokeless tobacco: Never Used  Substance and Sexual Activity  . Alcohol use: Not Currently  . Drug use: Not on file  . Sexual activity: Not on file  Lifestyle  .  Physical activity:    Days per week: Not on file    Minutes per session: Not on file  . Stress: Not on file  Relationships  . Social connections:    Talks on phone: Not on file    Gets together: Not on file    Attends religious service: Not on file    Active member of club or organization: Not on file    Attends meetings of clubs or organizations: Not on file    Relationship status: Not on file  . Intimate partner violence:    Fear of current or ex partner: Not on file    Emotionally abused: Not on file    Physically abused: Not on file    Forced sexual activity: Not on file  Other Topics Concern  . Not on file  Social History Narrative  . Not on file    Review  of Systems  Constitutional: Negative for chills, diaphoresis and fatigue.  HENT: Negative for ear pain, postnasal drip and sinus pressure.   Eyes: Negative for photophobia, discharge, redness, itching and visual disturbance.  Respiratory: Negative for cough, shortness of breath and wheezing.   Cardiovascular: Negative for chest pain, palpitations and leg swelling.  Gastrointestinal: Negative for abdominal pain, constipation, diarrhea, nausea and vomiting.  Genitourinary: Negative for dysuria and flank pain.  Musculoskeletal: Positive for arthralgias. Negative for back pain, gait problem and neck pain.  Skin: Negative for color change.  Allergic/Immunologic: Negative for environmental allergies and food allergies.  Neurological: Negative for dizziness and headaches.  Hematological: Does not bruise/bleed easily.  Psychiatric/Behavioral: Negative for agitation, behavioral problems (depression) and hallucinations.   Vital Signs: There were no vitals taken for this visit.  Observation/Objective: Pt was able communicate well She said there is no tenderness to touch but ROM is restricted   Assessment/Plan: 1. Torticollis Flexeril rx is given   2. GAD (generalized anxiety disorder) Stable   3. Hypothyroidism, unspecified type Continue synthroid   General Counseling: [name] verbalizes understanding of the findings of today's phone visit and agrees with plan of treatment. I have discussed any further diagnostic evaluation that may be needed or ordered today. We also reviewed her medications today. she has been encouraged to call the office with any questions or concerns that should arise related to todays visit.   Meds ordered this encounter  Medications  . DISCONTD: zolpidem (AMBIEN CR) 12.5 MG CR tablet    Sig: Take 1 tablet (12.5 mg total) by mouth at bedtime as needed for sleep.    Dispense:  30 tablet    Refill:  1  . zolpidem (AMBIEN CR) 12.5 MG CR tablet    Sig: Take 1 tablet  (12.5 mg total) by mouth at bedtime as needed for sleep.    Dispense:  30 tablet    Refill:  1    Time spent:12 Minutes    Dr Lavera Guise Internal medicine

## 2018-08-01 NOTE — Telephone Encounter (Signed)
Pt will need office visits in 2-3 months

## 2018-08-01 NOTE — Progress Notes (Signed)
[facility] [place], [place] [address]  Internal MEDICINE  Telephone Visit  Patient Name: Stephanie Larson  [id number]  [id number]  Date of Service: 08/01/2018  I connected with the patient at 12.00 by telephone and verified the patients identity using two identifiers.   I discussed the limitations, risks, security and privacy concerns of performing an evaluation and management service by telephone and the availability of in person appointments. I also discussed with the patient that there may be a patient responsible charge related to the service.  The patient expressed understanding and agrees to proceed.     HPI Pt is c/o neck pain on the left side, unable to move her neck. Slight nasal congestion as well. No fever or chills.she will like get some refills. Staying at home, no close contact is sick at this time     Current Medication: Outpatient Encounter Medications as of 08/01/2018  Medication Sig  . gabapentin (NEURONTIN) 300 MG capsule Take 1 capsule (300 mg total) by mouth 2 (two) times daily.  . hydrOXYzine (ATARAX/VISTARIL) 50 MG tablet Take 1 tablet (50 mg total) by mouth 3 (three) times daily as needed.  Marland Kitchen levothyroxine (SYNTHROID, LEVOTHROID) 200 MCG tablet Take 1 tablet (200 mcg total) by mouth daily before breakfast.  . rOPINIRole (REQUIP) 0.5 MG tablet Take 1 tablet (0.5 mg total) by mouth at bedtime.  Marland Kitchen zolpidem (AMBIEN CR) 12.5 MG CR tablet Take 1 tablet (12.5 mg total) by mouth at bedtime as needed for sleep.  . fluconazole (DIFLUCAN) 150 MG tablet Take one tab a day as needed 3 times (Patient not taking: Reported on 01/30/2018)  . Multiple Vitamins-Minerals (MULTIVITAMIN WITH MINERALS) tablet Take 1 tablet by mouth daily.  . traMADol (ULTRAM) 50 MG tablet tramadol 50 mg tablet  Take 1 tablet every 4 hours by oral route.  . [DISCONTINUED] amoxicillin-clavulanate (AUGMENTIN) 875-125 MG tablet Take 1 tablet by mouth 2 (two) times daily. (Patient not  taking: Reported on 01/30/2018)  . [DISCONTINUED] Carisoprodol (SOMA PO) carisoprodol  . [DISCONTINUED] predniSONE (STERAPRED UNI-PAK 21 TAB) 10 MG (21) TBPK tablet 6 day taper - take by mouth as directed for 6 days (Patient not taking: Reported on 08/01/2018)  . [DISCONTINUED] promethazine (PHENERGAN) 25 MG tablet Take 1/2 to 1 tablet po TID prn nausea (Patient not taking: Reported on 08/01/2018)  . [DISCONTINUED] sulfamethoxazole-trimethoprim (BACTRIM DS,SEPTRA DS) 800-160 MG tablet Take 1 tablet by mouth 2 (two) times daily. (Patient not taking: Reported on 08/01/2018)   No facility-administered encounter medications on file as of 08/01/2018.     Surgical History: Past Surgical History:  Procedure Laterality Date  . LAPAROSCOPIC GASTRIC BANDING    . TUBAL LIGATION      Medical History: Past Medical History:  Diagnosis Date  . Anxiety   . Depression   . Insomnia   . Memory changes   . Sinus drainage   . Sleep apnea   . Thyroid disease     Family History: Family History  Family history unknown: Yes    Social History   Socioeconomic History  . Marital status: Married    Spouse name: Not on file  . Number of children: Not on file  . Years of education: Not on file  . Highest education level: Not on file  Occupational History  . Not on file  Social Needs  . Financial resource strain: Not on file  . Food insecurity:    Worry: Not on file    Inability:  Not on file  . Transportation needs:    Medical: Not on file    Non-medical: Not on file  Tobacco Use  . Smoking status: Current Every Day Smoker    Packs/day: 0.50    Types: Cigarettes  . Smokeless tobacco: Never Used  Substance and Sexual Activity  . Alcohol use: Not Currently  . Drug use: Not on file  . Sexual activity: Not on file  Lifestyle  . Physical activity:    Days per week: Not on file    Minutes per session: Not on file  . Stress: Not on file  Relationships  . Social connections:    Talks on phone: Not  on file    Gets together: Not on file    Attends religious service: Not on file    Active member of club or organization: Not on file    Attends meetings of clubs or organizations: Not on file    Relationship status: Not on file  . Intimate partner violence:    Fear of current or ex partner: Not on file    Emotionally abused: Not on file    Physically abused: Not on file    Forced sexual activity: Not on file  Other Topics Concern  . Not on file  Social History Narrative  . Not on file      Review of Systems Neck pain  Nasal congestion Denies fever or chills Denies headaches   Vital Signs: There were no vitals taken for this visit.   Observation/Objective:  Pt was able communicate well She said there is no tenderness to touch but ROM is restricted    Assessment/Plan: 1. Torticollis Flexeril rx was sent in  2. Hypothyroidism, unspecified type Continue Synthroid as before   3. GAD (generalized anxiety disorder) Continue all meds as before   General Counseling: Stephanie Larson verbalizes understanding of the findings of today's phone visit and agrees with plan of treatment. I have discussed any further diagnostic evaluation that may be needed or ordered today. We also reviewed her medications today. she has been encouraged to call the office with any questions or concerns that should arise related to todays visit.    No orders of the defined types were placed in this encounter.   No orders of the defined types were placed in this encounter.   Time spent:10 Minutes    Dr Lavera Guise Internal medicine

## 2018-09-18 ENCOUNTER — Other Ambulatory Visit: Payer: Self-pay | Admitting: Nurse Practitioner

## 2018-09-25 ENCOUNTER — Ambulatory Visit: Payer: Self-pay | Admitting: Adult Health

## 2018-09-27 ENCOUNTER — Encounter: Payer: Self-pay | Admitting: Nurse Practitioner

## 2018-09-27 ENCOUNTER — Other Ambulatory Visit: Payer: Self-pay

## 2018-09-27 ENCOUNTER — Ambulatory Visit: Payer: Self-pay | Admitting: Nurse Practitioner

## 2018-09-27 DIAGNOSIS — E039 Hypothyroidism, unspecified: Secondary | ICD-10-CM

## 2018-09-27 DIAGNOSIS — G2581 Restless legs syndrome: Secondary | ICD-10-CM

## 2018-09-27 DIAGNOSIS — F409 Phobic anxiety disorder, unspecified: Secondary | ICD-10-CM

## 2018-09-27 DIAGNOSIS — M792 Neuralgia and neuritis, unspecified: Secondary | ICD-10-CM

## 2018-09-27 DIAGNOSIS — F411 Generalized anxiety disorder: Secondary | ICD-10-CM

## 2018-09-27 DIAGNOSIS — F5105 Insomnia due to other mental disorder: Secondary | ICD-10-CM

## 2018-09-27 MED ORDER — HYDROXYZINE HCL 50 MG PO TABS: 50.0000 mg | ORAL_TABLET | Freq: Three times a day (TID) | ORAL | 0 refills | Status: DC | PRN

## 2018-09-27 MED ORDER — LEVOTHYROXINE SODIUM 200 MCG PO TABS: 200.0000 ug | ORAL_TABLET | Freq: Every day | ORAL | 1 refills | Status: DC

## 2018-09-27 MED ORDER — ROPINIROLE HCL 0.5 MG PO TABS: 0.5000 mg | ORAL_TABLET | Freq: Every day | ORAL | 3 refills | Status: DC

## 2018-09-27 MED ORDER — ESZOPICLONE 3 MG PO TABS: 3.0000 mg | ORAL_TABLET | Freq: Every day | ORAL | 3 refills | Status: DC

## 2018-09-27 MED ORDER — GABAPENTIN 300 MG PO CAPS: 300.0000 mg | ORAL_CAPSULE | Freq: Two times a day (BID) | ORAL | 1 refills | Status: DC

## 2018-09-27 NOTE — Progress Notes (Signed)
[facility] [place], [place] [address]  Internal MEDICINE  Telephone Visit  Patient Name: Stephanie Larson  [id number]  [id number]  Date of Service: 10/07/2018  I connected with the patient at 2:05pm by webcam and verified the patients identity using two identifiers.   I discussed the limitations, risks, security and privacy concerns of performing an evaluation and management service by webcam and the availability of in person appointments. I also discussed with the patient that there may be a patient responsible charge related to the service.  The patient expressed understanding and agrees to proceed.    Chief Complaint  Patient presents with  . Telephone Assessment  . Telephone Screen  . Quality Metric Gaps    pap  . Medication Refill    The patient has been contacted via webcam for follow up visit due to concerns for spread of novel coronavirus.  The patient states that she continues to have trouble with sleep. She has trouble falling asleep and staying asleep. She is currently on Ambien CR 12.5mg  tablets and this is not working.        Current Medication: Outpatient Encounter Medications as of 09/27/2018  Medication Sig  . gabapentin (NEURONTIN) 300 MG capsule Take 1 capsule (300 mg total) by mouth 2 (two) times daily.  Marland Kitchen levothyroxine (SYNTHROID) 200 MCG tablet Take 1 tablet (200 mcg total) by mouth daily before breakfast.  . rOPINIRole (REQUIP) 0.5 MG tablet Take 1 tablet (0.5 mg total) by mouth at bedtime.  . [DISCONTINUED] gabapentin (NEURONTIN) 300 MG capsule Take 1 capsule (300 mg total) by mouth 2 (two) times daily.  . [DISCONTINUED] levothyroxine (SYNTHROID, LEVOTHROID) 200 MCG tablet Take 1 tablet (200 mcg total) by mouth daily before breakfast.  . [DISCONTINUED] rOPINIRole (REQUIP) 0.5 MG tablet TAKE ONE TABLET BY MOUTH AT BEDTIME  . [DISCONTINUED] zolpidem (AMBIEN CR) 12.5 MG CR tablet Take 1 tablet (12.5 mg total) by mouth at bedtime as needed  for sleep.  . hydrOXYzine (ATARAX/VISTARIL) 50 MG tablet Take 1 tablet (50 mg total) by mouth 3 (three) times daily as needed.  . [DISCONTINUED] cyclobenzaprine (FLEXERIL) 10 MG tablet Take one tab at bedtime prn (Patient not taking: Reported on 09/27/2018)  . [DISCONTINUED] Eszopiclone 3 MG TABS Take 1 tablet (3 mg total) by mouth at bedtime. Take immediately before bedtime  . [DISCONTINUED] fluconazole (DIFLUCAN) 150 MG tablet Take one tab a day as needed 3 times (Patient not taking: Reported on 01/30/2018)  . [DISCONTINUED] Multiple Vitamins-Minerals (MULTIVITAMIN WITH MINERALS) tablet Take 1 tablet by mouth daily.   No facility-administered encounter medications on file as of 09/27/2018.     Surgical History: Past Surgical History:  Procedure Laterality Date  . LAPAROSCOPIC GASTRIC BANDING    . TUBAL LIGATION      Medical History: Past Medical History:  Diagnosis Date  . Anxiety   . Depression   . Insomnia   . Memory changes   . Sinus drainage   . Sleep apnea   . Thyroid disease     Family History: Family History  Family history unknown: Yes    Social History   Socioeconomic History  . Marital status: Married    Spouse name: Not on file  . Number of children: Not on file  . Years of education: Not on file  . Highest education level: Not on file  Occupational History  . Not on file  Social Needs  . Financial resource strain: Not on file  . Food  insecurity:    Worry: Not on file    Inability: Not on file  . Transportation needs:    Medical: Not on file    Non-medical: Not on file  Tobacco Use  . Smoking status: Current Every Day Smoker    Packs/day: 0.50    Types: Cigarettes  . Smokeless tobacco: Never Used  Substance and Sexual Activity  . Alcohol use: Not Currently  . Drug use: Never  . Sexual activity: Not on file  Lifestyle  . Physical activity:    Days per week: Not on file    Minutes per session: Not on file  . Stress: Not on file  Relationships   . Social connections:    Talks on phone: Not on file    Gets together: Not on file    Attends religious service: Not on file    Active member of club or organization: Not on file    Attends meetings of clubs or organizations: Not on file    Relationship status: Not on file  . Intimate partner violence:    Fear of current or ex partner: Not on file    Emotionally abused: Not on file    Physically abused: Not on file    Forced sexual activity: Not on file  Other Topics Concern  . Not on file  Social History Narrative  . Not on file      Review of Systems  Constitutional: Positive for fatigue. Negative for chills and diaphoresis.  HENT: Negative for ear pain, postnasal drip and sinus pressure.   Respiratory: Negative for cough, shortness of breath and wheezing.   Cardiovascular: Negative for chest pain and palpitations.  Gastrointestinal: Negative for abdominal pain, constipation, diarrhea, nausea and vomiting.  Endocrine: Negative for cold intolerance, heat intolerance, polydipsia and polyuria.       Thyroid problems  Musculoskeletal: Positive for arthralgias. Negative for back pain, gait problem and neck pain.  Skin: Negative for color change.  Allergic/Immunologic: Negative for environmental allergies and food allergies.  Neurological: Negative for dizziness and headaches.  Hematological: Does not bruise/bleed easily.  Psychiatric/Behavioral: Positive for sleep disturbance. Negative for agitation, behavioral problems (depression) and hallucinations.    Vital Signs: There were no vitals taken for this visit.   Observation/Objective:   The patient is alert and oriented. She is pleasant and answers all questions appropriately. Breathing is non-labored. She is in no acute distress at this time.  The patient sounds anxious.    Assessment/Plan: 1. Hypothyroidism, unspecified type Will check thyroid panel and adjust levothyroxine as indicated.  - levothyroxine (SYNTHROID)  200 MCG tablet; Take 1 tablet (200 mcg total) by mouth daily before breakfast.  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 Will start lunesta 3mg  every night as needed for acute insomnia.   3. Neuralgia and neuritis, unspecified May continue gabapentin 300mg  twice daily  - gabapentin (NEURONTIN) 300 MG capsule; Take 1 capsule (300 mg total) by mouth 2 (two) times daily.  Dispense: 60 capsule; Refill: 1  4. Restless leg syndrome May continue requip 0.5mg  at bedtime - rOPINIRole (REQUIP) 0.5 MG tablet; Take 1 tablet (0.5 mg total) by mouth at bedtime.  Dispense: 30 tablet; Refill: 3  5. GAD (generalized anxiety disorder) - hydrOXYzine (ATARAX/VISTARIL) 50 MG tablet; Take 1 tablet (50 mg total) by mouth 3 (three) times daily as needed.  Dispense: 30 tablet; Refill: 0  General Counseling: Berlyn verbalizes understanding of the findings of  today's phone visit and agrees with plan of treatment. I have discussed any further diagnostic evaluation that may be needed or ordered today. We also reviewed her medications today. she has been encouraged to call the office with any questions or concerns that should arise related to todays visit.  This patient was seen by Norman with Dr Lavera Guise as a part of collaborative care agreement  Meds ordered this encounter  Medications  . DISCONTD: Eszopiclone 3 MG TABS    Sig: Take 1 tablet (3 mg total) by mouth at bedtime. Take immediately before bedtime    Dispense:  30 tablet    Refill:  3    Order Specific Question:   Supervising Provider    Answer:   Lavera Guise [place]  . hydrOXYzine (ATARAX/VISTARIL) 50 MG tablet    Sig: Take 1 tablet (50 mg total) by mouth 3 (three) times daily as needed.    Dispense:  30 tablet    Refill:  0    Order Specific Question:   Supervising Provider    Answer:   Lavera Guise [[id number]]  . gabapentin (NEURONTIN) 300 MG capsule    Sig: Take 1  capsule (300 mg total) by mouth 2 (two) times daily.    Dispense:  60 capsule    Refill:  1    Order Specific Question:   Supervising Provider    Answer:   Lavera Guise [[id number]]  . levothyroxine (SYNTHROID) 200 MCG tablet    Sig: Take 1 tablet (200 mcg total) by mouth daily before breakfast.    Dispense:  30 tablet    Refill:  1    Order Specific Question:   Supervising Provider    Answer:   Lavera Guise [place]  . rOPINIRole (REQUIP) 0.5 MG tablet    Sig: Take 1 tablet (0.5 mg total) by mouth at bedtime.    Dispense:  30 tablet    Refill:  3    Order Specific Question:   Supervising Provider    Answer:   Lavera Guise [[id number]]    Time spent: 42 Minutes    Dr Lavera Guise Internal medicine

## 2018-10-03 ENCOUNTER — Other Ambulatory Visit: Payer: Self-pay | Admitting: Nurse Practitioner

## 2018-10-03 ENCOUNTER — Encounter: Payer: Self-pay | Admitting: Nurse Practitioner

## 2018-10-03 ENCOUNTER — Other Ambulatory Visit: Payer: Self-pay

## 2018-10-03 ENCOUNTER — Ambulatory Visit: Payer: Self-pay | Admitting: Nurse Practitioner

## 2018-10-03 ENCOUNTER — Other Ambulatory Visit: Payer: Self-pay | Admitting: Adult Health

## 2018-10-03 VITALS — BP 128/84 | HR 87 | Resp 16 | Ht 66.0 in | Wt 231.2 lb

## 2018-10-03 DIAGNOSIS — Z0001 Encounter for general adult medical examination with abnormal findings: Secondary | ICD-10-CM

## 2018-10-03 DIAGNOSIS — Z1239 Encounter for other screening for malignant neoplasm of breast: Secondary | ICD-10-CM

## 2018-10-03 DIAGNOSIS — M545 Low back pain, unspecified: Secondary | ICD-10-CM

## 2018-10-03 DIAGNOSIS — Z1231 Encounter for screening mammogram for malignant neoplasm of breast: Secondary | ICD-10-CM

## 2018-10-03 DIAGNOSIS — F409 Phobic anxiety disorder, unspecified: Secondary | ICD-10-CM

## 2018-10-03 DIAGNOSIS — G8929 Other chronic pain: Secondary | ICD-10-CM

## 2018-10-03 DIAGNOSIS — R300 Dysuria: Secondary | ICD-10-CM

## 2018-10-03 DIAGNOSIS — F5105 Insomnia due to other mental disorder: Secondary | ICD-10-CM

## 2018-10-03 DIAGNOSIS — E039 Hypothyroidism, unspecified: Secondary | ICD-10-CM

## 2018-10-03 DIAGNOSIS — Z124 Encounter for screening for malignant neoplasm of cervix: Secondary | ICD-10-CM

## 2018-10-03 MED ORDER — TIZANIDINE HCL 4 MG PO TABS: ORAL_TABLET | ORAL | 1 refills | Status: DC

## 2018-10-03 MED ORDER — ZALEPLON 10 MG PO CAPS: 10.0000 mg | ORAL_CAPSULE | Freq: Every evening | ORAL | 1 refills | Status: DC | PRN

## 2018-10-03 NOTE — Progress Notes (Signed)
[facility] [address], [place] [address]  Internal MEDICINE  Office Visit Note  Patient Name: Stephanie Larson  [id number]  [id number]  Date of Service: 10/09/2018   Pt is here for routine health maintenance examination   Chief Complaint  Patient presents with  . Annual Exam  . Gynecologic Exam  . Back Pain    dull pain on mid back  . Medication Management    lunesta  . Fatigue     The patient is here for routine health maintenance exam with pap smear. The patient states that she continues to have trouble with sleep. She has trouble falling asleep and staying asleep. She had been on ambien CR 12.5mg  tablets. When not working, this was switched to [place] 3mg  at night. She has been taking this for a little over a week. She states that this is not helping at all. Finds that she is unable to concentrate at work and get tasks done as she is so fatigued. She is concerned about weight gain over past several months. Is due to have routine labs and thyroid panel checked. She has history of iron deficiency anemia   Current Medication: Outpatient Encounter Medications as of 10/03/2018  Medication Sig  . gabapentin (NEURONTIN) 300 MG capsule Take 1 capsule (300 mg total) by mouth 2 (two) times daily.  . hydrOXYzine (ATARAX/VISTARIL) 50 MG tablet Take 1 tablet (50 mg total) by mouth 3 (three) times daily as needed.  Marland Kitchen levothyroxine (SYNTHROID) 200 MCG tablet Take 1 tablet (200 mcg total) by mouth daily before breakfast.  . rOPINIRole (REQUIP) 0.5 MG tablet Take 1 tablet (0.5 mg total) by mouth at bedtime.  . [DISCONTINUED] Eszopiclone 3 MG TABS Take 1 tablet (3 mg total) by mouth at bedtime. Take immediately before bedtime  . [DISCONTINUED] zolpidem (AMBIEN CR) 12.5 MG CR tablet Take 1 tablet (12.5 mg total) by mouth at bedtime as needed for sleep.  Marland Kitchen tiZANidine (ZANAFLEX) 4 MG tablet Take 1/2 to 1 tablet po BID prn muscle pain/spasms.  . zaleplon (SONATA) 10 MG capsule  Take 1 capsule (10 mg total) by mouth at bedtime as needed for sleep.  . [DISCONTINUED] cyclobenzaprine (FLEXERIL) 10 MG tablet Take one tab at bedtime prn (Patient not taking: Reported on 09/27/2018)  . [DISCONTINUED] fluconazole (DIFLUCAN) 150 MG tablet Take one tab a day as needed 3 times (Patient not taking: Reported on 01/30/2018)  . [DISCONTINUED] Multiple Vitamins-Minerals (MULTIVITAMIN WITH MINERALS) tablet Take 1 tablet by mouth daily.   No facility-administered encounter medications on file as of 10/03/2018.     Surgical History: Past Surgical History:  Procedure Laterality Date  . LAPAROSCOPIC GASTRIC BANDING    . TUBAL LIGATION      Medical History: Past Medical History:  Diagnosis Date  . Anxiety   . Depression   . Insomnia   . Memory changes   . Sinus drainage   . Sleep apnea   . Thyroid disease     Family History: Family History  Family history unknown: Yes      Review of Systems  Constitutional: Positive for fatigue. Negative for chills and diaphoresis.  HENT: Negative for ear pain, postnasal drip and sinus pressure.   Respiratory: Negative for cough, shortness of breath and wheezing.   Cardiovascular: Negative for chest pain and palpitations.  Gastrointestinal: Negative for abdominal pain, constipation, diarrhea, nausea and vomiting.  Endocrine: Negative for cold intolerance, heat intolerance, polydipsia and polyuria.       Thyroid  problems  Genitourinary: Negative for dysuria, frequency, hematuria and urgency.  Musculoskeletal: Positive for arthralgias. Negative for back pain, gait problem and neck pain.  Skin: Negative for color change.  Allergic/Immunologic: Negative for environmental allergies and food allergies.  Neurological: Negative for dizziness and headaches.  Hematological: Does not bruise/bleed easily.  Psychiatric/Behavioral: Positive for dysphoric mood and sleep disturbance. Negative for agitation, behavioral problems (depression) and  hallucinations. The patient is nervous/anxious.        Still having problems with sleep despite changing fro Ambien CR to lunesta 3mg  at bedtime as needed    Today's Vitals   10/03/18 0858  BP: 128/84  Pulse: 87  Resp: 16  SpO2: 97%  Weight: 231 lb 3.2 oz (104.9 kg)  Height: 5\' 6"  (1.676 m)   Body mass index is 37.32 kg/m.  Physical Exam Vitals signs and nursing note reviewed.  Constitutional:      General: She is not in acute distress.    Appearance: Normal appearance. She is well-developed. She is obese. She is not diaphoretic.  HENT:     Head: Normocephalic and atraumatic.     Mouth/Throat:     Pharynx: No oropharyngeal exudate.  Eyes:     Pupils: Pupils are equal, round, and reactive to light.  Neck:     Musculoskeletal: Normal range of motion and neck supple.     Thyroid: No thyromegaly.     Vascular: No carotid bruit or JVD.     Trachea: No tracheal deviation.  Cardiovascular:     Rate and Rhythm: Normal rate and regular rhythm.     Pulses: Normal pulses.     Heart sounds: Normal heart sounds. No murmur. No friction rub. No gallop.   Pulmonary:     Effort: Pulmonary effort is normal. No respiratory distress.     Breath sounds: Normal breath sounds. No wheezing or rales.  Chest:     Chest wall: No tenderness.     Breasts:        Right: Normal. No swelling, bleeding, inverted nipple, mass, nipple discharge, skin change or tenderness.        Left: Normal. No swelling, bleeding, inverted nipple, mass, nipple discharge, skin change or tenderness.  Abdominal:     General: Bowel sounds are normal.     Palpations: Abdomen is soft.     Tenderness: There is no abdominal tenderness.     Hernia: There is no hernia in the right inguinal area or left inguinal area.  Genitourinary:    General: Normal vulva.     Exam position: Supine.     Labia:        Right: No tenderness.        Left: No tenderness.      Vagina: Normal. No vaginal discharge, erythema or tenderness.      Cervix: No cervical motion tenderness, discharge or friability.     Uterus: Normal.      Adnexa: Right adnexa normal and left adnexa normal.     Comments: No tenderness, masses, or organomeglay present during bimanual exam . Musculoskeletal: Normal range of motion.  Lymphadenopathy:     Cervical: No cervical adenopathy.     Lower Body: No right inguinal adenopathy. No left inguinal adenopathy.  Skin:    General: Skin is warm and dry.  Neurological:     Mental Status: She is alert and oriented to person, place, and time.     Cranial Nerves: No cranial nerve deficit.  Psychiatric:  Attention and Perception: Attention and perception normal.        Mood and Affect: Affect normal. Mood is anxious.        Speech: Speech normal.        Behavior: Behavior normal.        Thought Content: Thought content normal.        Cognition and Memory: Cognition and memory normal.        Judgment: Judgment normal.      LABS: Recent Results (from the past 2160 hour(s))  Urinalysis, Routine w reflex microscopic     Status: Abnormal   Collection Time: 10/03/18  8:45 AM  Result Value Ref Range   Specific Gravity, UA      >=1.030 (A) 1.005 - 1.030   pH, UA 5.0 5.0 - 7.5   Color, UA Yellow Yellow   Appearance Ur Clear Clear   Leukocytes,UA Negative Negative   Protein,UA Negative Negative/Trace   Glucose, UA Negative Negative   Ketones, UA Negative Negative   RBC, UA Negative Negative   Bilirubin, UA Negative Negative   Urobilinogen, Ur 0.2 0.2 - 1.0 mg/dL   Nitrite, UA Negative Negative   Microscopic Examination Comment     Comment: Microscopic not indicated and not performed.  Comprehensive metabolic panel     Status: Abnormal   Collection Time: 10/03/18 10:28 AM  Result Value Ref Range   Glucose 110 (H) 65 - 99 mg/dL   BUN 15 6 - 24 mg/dL   Creatinine, Ser 1.01 (H) 0.57 - 1.00 mg/dL   GFR calc non Af Amer 67 >59 mL/min/1.73   GFR calc Af Amer 77 >59 mL/min/1.73   BUN/Creatinine Ratio  15 9 - 23   Sodium 141 134 - 144 mmol/L   Potassium 4.0 3.5 - 5.2 mmol/L   Chloride 104 96 - 106 mmol/L   CO2 21 20 - 29 mmol/L   Calcium 9.7 8.7 - 10.2 mg/dL   Total Protein 6.9 6.0 - 8.5 g/dL   Albumin 4.4 3.8 - 4.8 g/dL   Globulin, Total 2.5 1.5 - 4.5 g/dL   Albumin/Globulin Ratio 1.8 1.2 - 2.2   Bilirubin Total 0.4 0.0 - 1.2 mg/dL   Alkaline Phosphatase 72 39 - 117 IU/L   AST 26 0 - 40 IU/L   ALT 29 0 - 32 IU/L  CBC     Status: Abnormal   Collection Time: 10/03/18 10:28 AM  Result Value Ref Range   WBC 14.1 (H) 3.4 - 10.8 x10E3/uL   RBC 4.97 3.77 - 5.28 x10E6/uL   Hemoglobin 14.0 11.1 - 15.9 g/dL   Hematocrit 40.8 34.0 - 46.6 %   MCV 82 79 - 97 fL   MCH 28.2 26.6 - 33.0 pg   MCHC 34.3 31.5 - 35.7 g/dL   RDW 13.1 11.7 - 15.4 %   Platelets 579 (H) 150 - 450 x10E3/uL  B12 and Folate Panel     Status: None   Collection Time: 10/03/18 10:28 AM  Result Value Ref Range   Vitamin B-12 668 232 - 1,245 pg/mL   Folate 19.0 >3.0 ng/mL    Comment: A serum folate concentration of less than 3.1 ng/mL is considered to represent clinical deficiency.   Hgb A1c w/o eAG     Status: None   Collection Time: 10/03/18 10:28 AM  Result Value Ref Range   Hgb A1c MFr Bld 5.6 4.8 - 5.6 %    Comment:  Prediabetes: 5.7 - 6.4          Diabetes: >6.4          Glycemic control for adults with diabetes: <7.0   T4, free     Status: None   Collection Time: 10/03/18 10:28 AM  Result Value Ref Range   Free T4 1.62 0.82 - 1.77 ng/dL  TSH     Status: Abnormal   Collection Time: 10/03/18 10:28 AM  Result Value Ref Range   TSH 0.036 (L) 0.450 - 4.500 uIU/mL  T3     Status: None   Collection Time: 10/03/18 10:28 AM  Result Value Ref Range   T3, Total 118 71 - 180 ng/dL  Ferritin     Status: None   Collection Time: 10/03/18 10:28 AM  Result Value Ref Range   Ferritin 66 15 - 150 ng/mL    Assessment/Plan: 1. Encounter for general adult medical examination with abnormal findings Annual  health maintenance exam with pap smear today.  2. Acquired hypothyroidism Will check thyroid panel and adjust levothyroxine as indicated.   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 Will try short term prescription for sonata 10mg  at bedtime as needed. If effective, will send full prescription to her pharmacy.  - zaleplon (SONATA) 10 MG capsule; Take 1 capsule (10 mg total) by mouth at bedtime as needed for sleep.  Dispense: 10 capsule; Refill: 1  4. Chronic midline low back pain without sciatica May take tizanidine 4mg , 1/2 to 1 tablet twice daily as needed for muscle pain ad tightness. Advised her to use with caution as this may cause drowsniess.  - tiZANidine (ZANAFLEX) 4 MG tablet; Take 1/2 to 1 tablet po BID prn muscle pain/spasms.  Dispense: 45 tablet; Refill: 1  5. Routine cervical smear - Pap IG and HPV (high risk) DNA detection  6. Screening for breast cancer Screening mammogram to be scheduled   7. Dysuria - Urinalysis, Routine w reflex microscopic  General Counseling: Nayali verbalizes understanding of the findings of todays visit and agrees with plan of treatment. I have discussed any further diagnostic evaluation that may be needed or ordered today. We also reviewed her medications today. she has been encouraged to call the office with any questions or concerns that should arise related to todays visit.    Counseling:  This patient was seen by Leretha Pol FNP Collaboration with Dr Lavera Guise as a part of collaborative care agreement  Orders Placed This Encounter  Procedures  . Urinalysis, Routine w reflex microscopic    Meds ordered this encounter  Medications  . tiZANidine (ZANAFLEX) 4 MG tablet    Sig: Take 1/2 to 1 tablet po BID prn muscle pain/spasms.    Dispense:  45 tablet    Refill:  1    Order Specific Question:   Supervising Provider    Answer:   Lavera Guise [[id number]]  . zaleplon (SONATA) 10 MG capsule    Sig: Take 1 capsule (10 mg total) by  mouth at bedtime as needed for sleep.    Dispense:  10 capsule    Refill:  1    Order Specific Question:   Supervising Provider    Answer:   Lavera Guise [[id number]]    Time spent: [place], MD  Internal Medicine

## 2018-10-04 LAB — URINALYSIS, ROUTINE W REFLEX MICROSCOPIC
Bilirubin, UA: NEGATIVE
Glucose, UA: NEGATIVE
Ketones, UA: NEGATIVE
Leukocytes,UA: NEGATIVE
Nitrite, UA: NEGATIVE
Protein,UA: NEGATIVE
RBC, UA: NEGATIVE
Specific Gravity, UA: >=1.03 — AB (ref 1.005–1.030)
Urobilinogen, Ur: 0.2 mg/dL (ref 0.2–1.0)
pH, UA: 5 (ref 5.0–7.5)

## 2018-10-04 LAB — T3: T3, Total: 118 ng/dL (ref 71–180)

## 2018-10-04 LAB — FERRITIN: Ferritin: 66 ng/mL (ref 15–150)

## 2018-10-04 LAB — CBC
Hematocrit: 40.8 % (ref 34.0–46.6)
Hemoglobin: 14 g/dL (ref 11.1–15.9)
MCH: 28.2 pg (ref 26.6–33.0)
MCHC: 34.3 g/dL (ref 31.5–35.7)
MCV: 82 fL (ref 79–97)
Platelets: 579 10*3/uL — ABNORMAL HIGH (ref 150–450)
RBC: 4.97 x10E6/uL (ref 3.77–5.28)
RDW: 13.1 % (ref 11.7–15.4)
WBC: 14.1 10*3/uL — ABNORMAL HIGH (ref 3.4–10.8)

## 2018-10-04 LAB — COMPREHENSIVE METABOLIC PANEL
ALT: 29 IU/L (ref 0–32)
AST: 26 IU/L (ref 0–40)
Albumin/Globulin Ratio: 1.8 (ref 1.2–2.2)
Albumin: 4.4 g/dL (ref 3.8–4.8)
Alkaline Phosphatase: 72 IU/L (ref 39–117)
BUN/Creatinine Ratio: 15 (ref 9–23)
BUN: 15 mg/dL (ref 6–24)
Bilirubin Total: 0.4 mg/dL (ref 0.0–1.2)
CO2: 21 mmol/L (ref 20–29)
Calcium: 9.7 mg/dL (ref 8.7–10.2)
Chloride: 104 mmol/L (ref 96–106)
Creatinine, Ser: 1.01 mg/dL — ABNORMAL HIGH (ref 0.57–1.00)
GFR calc Af Amer: 77 mL/min/{1.73_m2} (ref >59)
GFR calc non Af Amer: 67 mL/min/{1.73_m2} (ref >59)
Globulin, Total: 2.5 g/dL (ref 1.5–4.5)
Glucose: 110 mg/dL — ABNORMAL HIGH (ref 65–99)
Potassium: 4 mmol/L (ref 3.5–5.2)
Sodium: 141 mmol/L (ref 134–144)
Total Protein: 6.9 g/dL (ref 6.0–8.5)

## 2018-10-04 LAB — HGB A1C W/O EAG: Hgb A1c MFr Bld: 5.6 % (ref 4.8–5.6)

## 2018-10-04 LAB — B12 AND FOLATE PANEL
Folate: 19 ng/mL (ref >3.0)
Vitamin B-12: 668 pg/mL (ref 232–1245)

## 2018-10-04 LAB — TSH: TSH: 0.036 u[IU]/mL — ABNORMAL LOW (ref 0.450–4.500)

## 2018-10-04 LAB — T4, FREE: Free T4: 1.62 ng/dL (ref 0.82–1.77)

## 2018-10-07 DIAGNOSIS — F5105 Insomnia due to other mental disorder: Secondary | ICD-10-CM | POA: Insufficient documentation

## 2018-10-07 DIAGNOSIS — M792 Neuralgia and neuritis, unspecified: Secondary | ICD-10-CM | POA: Insufficient documentation

## 2018-10-07 DIAGNOSIS — G2581 Restless legs syndrome: Secondary | ICD-10-CM | POA: Insufficient documentation

## 2018-10-07 DIAGNOSIS — F411 Generalized anxiety disorder: Secondary | ICD-10-CM | POA: Insufficient documentation

## 2018-10-07 DIAGNOSIS — F409 Phobic anxiety disorder, unspecified: Secondary | ICD-10-CM | POA: Insufficient documentation

## 2018-10-09 DIAGNOSIS — Z1239 Encounter for other screening for malignant neoplasm of breast: Secondary | ICD-10-CM | POA: Insufficient documentation

## 2018-10-09 DIAGNOSIS — G8929 Other chronic pain: Secondary | ICD-10-CM | POA: Insufficient documentation

## 2018-10-12 LAB — PAP IG AND HPV HIGH-RISK: HPV, high-risk: NEGATIVE

## 2018-10-28 ENCOUNTER — Telehealth: Payer: Self-pay

## 2018-10-28 ENCOUNTER — Other Ambulatory Visit: Payer: Self-pay | Admitting: Nurse Practitioner

## 2018-10-28 DIAGNOSIS — F409 Phobic anxiety disorder, unspecified: Secondary | ICD-10-CM

## 2018-10-28 MED ORDER — ZOLPIDEM TARTRATE ER 12.5 MG PO TBCR: 12.5000 mg | EXTENDED_RELEASE_TABLET | Freq: Every evening | ORAL | 2 refills | Status: DC | PRN

## 2018-10-28 NOTE — Telephone Encounter (Signed)
D/c sonata as this was inefective for patient. Went back to zolpidem 12.5mg  at bedtime sa needed. New prescription sent to [facility] .

## 2018-10-28 NOTE — Progress Notes (Signed)
D/c sonata as this was inefective for patient. Went back to zolpidem 12.5mg  at bedtime sa needed. New prescription sent to [facility] .

## 2018-10-29 NOTE — Telephone Encounter (Signed)
lmom 

## 2018-10-29 NOTE — Telephone Encounter (Signed)
Pt advised we send ambien to [facility]

## 2018-11-04 ENCOUNTER — Ambulatory Visit: Payer: Self-pay | Admitting: Nurse Practitioner

## 2018-11-20 ENCOUNTER — Other Ambulatory Visit: Payer: Self-pay

## 2018-11-20 DIAGNOSIS — G8929 Other chronic pain: Secondary | ICD-10-CM

## 2018-11-20 MED ORDER — TIZANIDINE HCL 4 MG PO TABS: ORAL_TABLET | ORAL | 1 refills | Status: DC

## 2018-11-26 ENCOUNTER — Other Ambulatory Visit: Payer: Self-pay

## 2018-11-26 DIAGNOSIS — E039 Hypothyroidism, unspecified: Secondary | ICD-10-CM

## 2018-11-26 MED ORDER — LEVOTHYROXINE SODIUM 200 MCG PO TABS: 200.0000 ug | ORAL_TABLET | Freq: Every day | ORAL | 6 refills | Status: DC

## 2018-11-28 ENCOUNTER — Other Ambulatory Visit: Payer: Self-pay | Admitting: Nurse Practitioner

## 2018-11-28 DIAGNOSIS — E039 Hypothyroidism, unspecified: Secondary | ICD-10-CM

## 2018-12-11 ENCOUNTER — Other Ambulatory Visit: Payer: Self-pay | Admitting: Nurse Practitioner

## 2018-12-11 DIAGNOSIS — F411 Generalized anxiety disorder: Secondary | ICD-10-CM

## 2018-12-11 DIAGNOSIS — M792 Neuralgia and neuritis, unspecified: Secondary | ICD-10-CM

## 2018-12-11 MED ORDER — HYDROXYZINE HCL 50 MG PO TABS: 50.0000 mg | ORAL_TABLET | Freq: Three times a day (TID) | ORAL | 2 refills | Status: DC | PRN

## 2018-12-11 MED ORDER — GABAPENTIN 300 MG PO CAPS: 300.0000 mg | ORAL_CAPSULE | Freq: Two times a day (BID) | ORAL | 2 refills | Status: DC

## 2018-12-11 NOTE — Progress Notes (Signed)
Refills for hydroxyzine and gabapentin sent to [facility].

## 2019-01-16 ENCOUNTER — Other Ambulatory Visit: Payer: Self-pay

## 2019-01-16 DIAGNOSIS — G8929 Other chronic pain: Secondary | ICD-10-CM

## 2019-01-16 MED ORDER — TIZANIDINE HCL 4 MG PO TABS: ORAL_TABLET | ORAL | 1 refills | Status: DC

## 2019-01-24 ENCOUNTER — Other Ambulatory Visit: Payer: Self-pay | Admitting: Nurse Practitioner

## 2019-01-24 DIAGNOSIS — F409 Phobic anxiety disorder, unspecified: Secondary | ICD-10-CM

## 2019-01-24 MED ORDER — ZOLPIDEM TARTRATE ER 12.5 MG PO TBCR: 12.5000 mg | EXTENDED_RELEASE_TABLET | Freq: Every evening | ORAL | 0 refills | Status: DC | PRN

## 2019-01-24 NOTE — Progress Notes (Signed)
Refilled #10 zolpidem 12.5mg  tablets and sent to [facility].

## 2019-02-03 ENCOUNTER — Other Ambulatory Visit: Payer: Self-pay

## 2019-02-03 ENCOUNTER — Ambulatory Visit: Payer: Self-pay | Admitting: Nurse Practitioner

## 2019-02-03 ENCOUNTER — Encounter: Payer: Self-pay | Admitting: Nurse Practitioner

## 2019-02-03 DIAGNOSIS — G8929 Other chronic pain: Secondary | ICD-10-CM

## 2019-02-03 DIAGNOSIS — F409 Phobic anxiety disorder, unspecified: Secondary | ICD-10-CM

## 2019-02-03 DIAGNOSIS — F411 Generalized anxiety disorder: Secondary | ICD-10-CM

## 2019-02-03 DIAGNOSIS — F5105 Insomnia due to other mental disorder: Secondary | ICD-10-CM

## 2019-02-03 DIAGNOSIS — E039 Hypothyroidism, unspecified: Secondary | ICD-10-CM

## 2019-02-03 DIAGNOSIS — N920 Excessive and frequent menstruation with regular cycle: Secondary | ICD-10-CM | POA: Insufficient documentation

## 2019-02-03 DIAGNOSIS — M545 Low back pain: Secondary | ICD-10-CM

## 2019-02-03 DIAGNOSIS — M792 Neuralgia and neuritis, unspecified: Secondary | ICD-10-CM

## 2019-02-03 DIAGNOSIS — G2581 Restless legs syndrome: Secondary | ICD-10-CM

## 2019-02-03 MED ORDER — HYDROXYZINE HCL 50 MG PO TABS: 50.0000 mg | ORAL_TABLET | Freq: Three times a day (TID) | ORAL | 3 refills | Status: DC | PRN

## 2019-02-03 MED ORDER — ROPINIROLE HCL 0.5 MG PO TABS: 0.5000 mg | ORAL_TABLET | Freq: Every day | ORAL | 3 refills | Status: DC

## 2019-02-03 MED ORDER — TIZANIDINE HCL 4 MG PO TABS: ORAL_TABLET | ORAL | 3 refills | Status: DC

## 2019-02-03 MED ORDER — GABAPENTIN 300 MG PO CAPS: 300.0000 mg | ORAL_CAPSULE | Freq: Two times a day (BID) | ORAL | 3 refills | Status: DC

## 2019-02-03 MED ORDER — ZOLPIDEM TARTRATE ER 12.5 MG PO TBCR: 12.5000 mg | EXTENDED_RELEASE_TABLET | Freq: Every evening | ORAL | 3 refills | Status: DC | PRN

## 2019-02-03 MED ORDER — LEVOTHYROXINE SODIUM 200 MCG PO TABS: 200.0000 ug | ORAL_TABLET | Freq: Every day | ORAL | 6 refills | Status: DC

## 2019-02-03 NOTE — Progress Notes (Signed)
[facility] [place], [place] [address]  Internal MEDICINE  Telephone Visit  Patient Name: Stephanie Larson  [id number]  [id number]  Date of Service: 02/03/2019  I connected with the patient at 1:54pm by webcam and verified the patients identity using two identifiers.   I discussed the limitations, risks, security and privacy concerns of performing an evaluation and management service by webcam and the availability of in person appointments. I also discussed with the patient that there may be a patient responsible charge related to the service.  The patient expressed understanding and agrees to proceed.    Chief Complaint  Patient presents with  . Telephone Assessment  . Telephone Screen  . Hypothyroidism  . Fatigue    whole body hurts  . Shoulder Pain    around shoulder blades ache  . Menstrual Problem    very painful, few months for 16/17 days, heavy bleeding    The patient has been contacted via webcam for follow up visit due to concerns for spread of novel coronavirus. The patient states that she is having pain all over her body, especially in between her shoulder blades. She denies any new or heavy activity. She has been having this for several years. This has become worse over past few months. She states that it is almost debilitating.  We did check a connective tissue panel in 2017. Her sed rate was elevated byt other labs were normal. Her last thyroid panel showed low TSH but normal t3 and free t4.  She is complaining of heavy and long menstrual cycles. She states they last for three weeks and comes with moderate to severe cramping.        Current Medication: Outpatient Encounter Medications as of 02/03/2019  Medication Sig  . gabapentin (NEURONTIN) 300 MG capsule Take 1 capsule (300 mg total) by mouth 2 (two) times daily.  . hydrOXYzine (ATARAX/VISTARIL) 50 MG tablet Take 1 tablet (50 mg total) by mouth 3 (three) times daily as needed.  Marland Kitchen  levothyroxine (SYNTHROID) 200 MCG tablet Take 1 tablet (200 mcg total) by mouth daily before breakfast.  . rOPINIRole (REQUIP) 0.5 MG tablet Take 1 tablet (0.5 mg total) by mouth at bedtime.  Marland Kitchen tiZANidine (ZANAFLEX) 4 MG tablet Take 1/2 to 1 tablet po BID prn muscle pain/spasms.  Marland Kitchen zolpidem (AMBIEN CR) 12.5 MG CR tablet Take 1 tablet (12.5 mg total) by mouth at bedtime as needed for sleep.  . [DISCONTINUED] gabapentin (NEURONTIN) 300 MG capsule Take 1 capsule (300 mg total) by mouth 2 (two) times daily.  . [DISCONTINUED] hydrOXYzine (ATARAX/VISTARIL) 50 MG tablet Take 1 tablet (50 mg total) by mouth 3 (three) times daily as needed.  . [DISCONTINUED] levothyroxine (SYNTHROID) 200 MCG tablet Take 1 tablet (200 mcg total) by mouth daily before breakfast.  . [DISCONTINUED] rOPINIRole (REQUIP) 0.5 MG tablet Take 1 tablet (0.5 mg total) by mouth at bedtime.  . [DISCONTINUED] tiZANidine (ZANAFLEX) 4 MG tablet Take 1/2 to 1 tablet po BID prn muscle pain/spasms.  . [DISCONTINUED] zolpidem (AMBIEN CR) 12.5 MG CR tablet Take 1 tablet (12.5 mg total) by mouth at bedtime as needed for sleep.   No facility-administered encounter medications on file as of 02/03/2019.     Surgical History: Past Surgical History:  Procedure Laterality Date  . LAPAROSCOPIC GASTRIC BANDING    . TUBAL LIGATION      Medical History: Past Medical History:  Diagnosis Date  . Anxiety   . Depression   . Insomnia   .  Memory changes   . Sinus drainage   . Sleep apnea   . Thyroid disease     Family History: Family History  Family history unknown: Yes    Social History   Socioeconomic History  . Marital status: Married    Spouse name: Not on file  . Number of children: Not on file  . Years of education: Not on file  . Highest education level: Not on file  Occupational History  . Not on file  Social Needs  . Financial resource strain: Not on file  . Food insecurity    Worry: Not on file    Inability: Not on file   . Transportation needs    Medical: Not on file    Non-medical: Not on file  Tobacco Use  . Smoking status: Current Every Day Smoker    Packs/day: 0.50    Types: Cigarettes  . Smokeless tobacco: Never Used  Substance and Sexual Activity  . Alcohol use: Not Currently  . Drug use: Never  . Sexual activity: Not on file  Lifestyle  . Physical activity    Days per week: Not on file    Minutes per session: Not on file  . Stress: Not on file  Relationships  . Social Herbalist on phone: Not on file    Gets together: Not on file    Attends religious service: Not on file    Active member of club or organization: Not on file    Attends meetings of clubs or organizations: Not on file    Relationship status: Not on file  . Intimate partner violence    Fear of current or ex partner: Not on file    Emotionally abused: Not on file    Physically abused: Not on file    Forced sexual activity: Not on file  Other Topics Concern  . Not on file  Social History Narrative  . Not on file      Review of Systems  Constitutional: Positive for fatigue. Negative for chills and diaphoresis.  HENT: Negative for ear pain, postnasal drip and sinus pressure.   Respiratory: Negative for cough, shortness of breath and wheezing.   Cardiovascular: Negative for chest pain and palpitations.  Gastrointestinal: Negative for abdominal pain, constipation, diarrhea, nausea and vomiting.  Endocrine: Negative for cold intolerance, heat intolerance, polydipsia and polyuria.       Well managed thyroid disease  Genitourinary: Positive for menstrual problem. Negative for dysuria, frequency, hematuria and urgency.       Periods are heavy and long, lasting for several weeks. Having severe cramping with every period.  Musculoskeletal: Positive for arthralgias, back pain and myalgias. Negative for gait problem and neck pain.  Skin: Negative for color change.  Allergic/Immunologic: Negative for environmental  allergies and food allergies.  Neurological: Positive for headaches. Negative for dizziness.  Hematological: Does not bruise/bleed easily.  Psychiatric/Behavioral: Positive for dysphoric mood and sleep disturbance. Negative for agitation, behavioral problems (depression) and hallucinations. The patient is nervous/anxious.        Still having problems with sleep despite changing fro Ambien CR to lunesta 3mg  at bedtime as needed    Vital Signs: There were no vitals taken for this visit.   Observation/Objective:   The patient is alert and oriented. She is pleasant and answers all questions appropriately. Breathing is non-labored. She is in no acute distress at this time. The patient appears fatigued.    Assessment/Plan: 1. Hypothyroidism, unspecified type Thyroid panel  stable. Continue levothyroxine at current dose  - levothyroxine (SYNTHROID) 200 MCG tablet; Take 1 tablet (200 mcg total) by mouth daily before breakfast.  Dispense: 30 tablet; Refill: 6  2. Chronic midline low back pain without sciatica Increase zanaflex to BID prn. Check connective tissue panel and adjust treatment as indicated  - tiZANidine (ZANAFLEX) 4 MG tablet; Take 1/2 to 1 tablet po BID prn muscle pain/spasms.  Dispense: 60 tablet; Refill: 3  3. Neuralgia and neuritis, unspecified - gabapentin (NEURONTIN) 300 MG capsule; Take 1 capsule (300 mg total) by mouth 2 (two) times daily.  Dispense: 60 capsule; Refill: 3  4. Menorrhagia with regular cycle Will get transvaginal pelvic ultrasound for further evaluaiton.  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  - zolpidem (AMBIEN CR) 12.5 MG CR tablet; Take 1 tablet (12.5 mg total) by mouth at bedtime as needed for sleep.  Dispense: 30 tablet; Refill: 3  6. GAD (generalized anxiety disorder) - hydrOXYzine (ATARAX/VISTARIL) 50 MG tablet; Take 1 tablet (50 mg total) by mouth 3  (three) times daily as needed.  Dispense: 90 tablet; Refill: 3  7. Restless leg syndrome - rOPINIRole (REQUIP) 0.5 MG tablet; Take 1 tablet (0.5 mg total) by mouth at bedtime.  Dispense: 30 tablet; Refill: 3  General Counseling: Anhthu verbalizes understanding of the findings of today's phone visit and agrees with plan of treatment. I have discussed any further diagnostic evaluation that may be needed or ordered today. We also reviewed her medications today. she has been encouraged to call the office with any questions or concerns that should arise related to todays visit.  This patient was seen by Leretha Pol FNP Collaboration with Dr Lavera Guise as a part of collaborative care agreement  Orders Placed This Encounter  Procedures  . US Pelvic Complete With Transvaginal    Meds ordered this encounter  Medications  . zolpidem (AMBIEN CR) 12.5 MG CR tablet    Sig: Take 1 tablet (12.5 mg total) by mouth at bedtime as needed for sleep.    Dispense:  30 tablet    Refill:  3    Order Specific Question:   Supervising Provider    Answer:   Lavera Guise [id number]  . gabapentin (NEURONTIN) 300 MG capsule    Sig: Take 1 capsule (300 mg total) by mouth 2 (two) times daily.    Dispense:  60 capsule    Refill:  3    Order Specific Question:   Supervising Provider    Answer:   Lavera Guise [id number]  . hydrOXYzine (ATARAX/VISTARIL) 50 MG tablet    Sig: Take 1 tablet (50 mg total) by mouth 3 (three) times daily as needed.    Dispense:  90 tablet    Refill:  3    Order Specific Question:   Supervising Provider    Answer:   Lavera Guise [id number]  . levothyroxine (SYNTHROID) 200 MCG tablet    Sig: Take 1 tablet (200 mcg total) by mouth daily before breakfast.    Dispense:  30 tablet    Refill:  6    Order Specific Question:   Supervising Provider    Answer:   Lavera Guise [place]  . rOPINIRole (REQUIP) 0.5 MG tablet    Sig: Take 1 tablet (0.5 mg total) by mouth at bedtime.    Dispense:  30 tablet     Refill:  3    Order Specific Question:   Supervising Provider    Answer:   Lavera Guise [id number]  . tiZANidine (ZANAFLEX) 4 MG tablet    Sig: Take 1/2 to 1 tablet po BID prn muscle pain/spasms.    Dispense:  60 tablet    Refill:  3    Order Specific Question:   Supervising Provider    Answer:   Lavera Guise [id number]    Time spent: 59 Minutes    Dr Lavera Guise Internal medicine

## 2019-02-11 ENCOUNTER — Ambulatory Visit
Admission: RE | Admit: 2019-02-11 | Discharge: 2019-02-11 | Disposition: A | Discharge status: Home / Self Care | Payer: Self-pay | Source: Ambulatory Visit | Attending: Nurse Practitioner | Admitting: Nurse Practitioner

## 2019-02-11 ENCOUNTER — Other Ambulatory Visit: Payer: Self-pay

## 2019-02-11 DIAGNOSIS — N920 Excessive and frequent menstruation with regular cycle: Secondary | ICD-10-CM | POA: Insufficient documentation

## 2019-02-12 ENCOUNTER — Telehealth: Payer: Self-pay

## 2019-02-12 NOTE — Telephone Encounter (Signed)
Pt advised for U/s result and gave beth message for referral

## 2019-02-12 NOTE — Telephone Encounter (Signed)
-----   Message from Ronnell Freshwater, NP sent at 02/12/2019  9:13 AM EDT ----- Please let the patient know that her ultrasound shows a large uterine fibroid, which is consistent with the symptoms she is having. I would like to refer her to GYN provider for further evaluation and treatment. Thanks.

## 2019-02-12 NOTE — Progress Notes (Signed)
Please let the patient know that her ultrasound shows a large uterine fibroid, which is consistent with the symptoms she is having. I would like to refer her to GYN provider for further evaluation and treatment. Thanks.

## 2019-02-17 ENCOUNTER — Ambulatory Visit: Payer: Self-pay | Admitting: Nurse Practitioner

## 2019-02-20 ENCOUNTER — Other Ambulatory Visit (HOSPITAL_COMMUNITY)
Admission: RE | Admit: 2019-02-20 | Discharge: 2019-02-20 | Disposition: A | Discharge status: Home / Self Care | Payer: Self-pay | Source: Ambulatory Visit | Attending: Obstetrics and Gynecology | Admitting: Obstetrics and Gynecology

## 2019-02-20 ENCOUNTER — Encounter: Payer: Self-pay | Admitting: Obstetrics and Gynecology

## 2019-02-20 ENCOUNTER — Other Ambulatory Visit: Payer: Self-pay

## 2019-02-20 ENCOUNTER — Ambulatory Visit: Payer: Self-pay | Admitting: Obstetrics and Gynecology

## 2019-02-20 VITALS — BP 125/73 | HR 87 | Ht 66.0 in | Wt 225.0 lb

## 2019-02-20 DIAGNOSIS — N921 Excessive and frequent menstruation with irregular cycle: Secondary | ICD-10-CM | POA: Insufficient documentation

## 2019-02-20 DIAGNOSIS — D252 Subserosal leiomyoma of uterus: Secondary | ICD-10-CM

## 2019-02-20 DIAGNOSIS — N83201 Unspecified ovarian cyst, right side: Secondary | ICD-10-CM

## 2019-02-20 NOTE — Progress Notes (Signed)
Obstetrics & Gynecology Office Visit   Chief Complaint  Patient presents with  . Ovarian Cyst    Referred by Leretha Pol NP  . Fibroids   The patient is seen in referral at the request of Ronnell Freshwater, NP from [facility] for fibroid uterus and heavy periods.   History of Present Illness: 48 y.o. G3P3 female who is seen in referral from Ronnell Freshwater, NP from [facility] for fibroid uterus and heavy periods along with a right ovarian cyst (measuring 2.9 x 1.2 x 2.7 cm).  She had a recent pelvic ultrasound that showed an exophytic fibroid measuring 4.2 x 3.8 x 4 cm.   She states that she has been having heavy periods this whole year.  Prior to this year she had a period that came monthly, lasting 4-5 days.  The periods were only heavy the first 2 days. They were painful.  In the past year her periods are nearly constant.  They are painful, worse than before.  She passing clots with her bleeding.  There have been times where she would wear a pad and tampon and she had to change them every 45 minutes.  This continues up until today.  She may go 1-1.5 weeks without bleeding.   She has pain in her suprapubic that she describes as dull.  When she has her period the pain is from the front to the back.  Alleviating factors: none.  Aggravating factors: none.  Associated symptoms:  None.  She rates her pain an 8/10.  The pain has been present for a while.  It has gotten worse over time.  The pain has really been bad the past two years.   Her last pap smear was 09/2018 and this was normal with negative HPV.    On 10/13 she had an ultrasound with the above-noted findings. Also, notable was an endometrial lining that was 7 mm.    Past Medical History:  Diagnosis Date  . Anxiety   . Depression   . Insomnia   . Memory changes   . Sinus drainage   . Sleep apnea   . Thyroid disease     Past Surgical History:  Procedure Laterality Date  . LAPAROSCOPIC GASTRIC  BANDING    . TUBAL LIGATION      Gynecologic History: LMP was early September  Obstetric History: G3P3, s/p SVD x 3  Family History  Problem Relation Age of Onset  . Colon cancer Paternal Grandfather   . Ovarian cancer Paternal Grandmother   . Hypertension Maternal Grandmother   . Hypertension Father   . Emphysema Father   . ALS Mother   . Hypertension Mother     Social History   Socioeconomic History  . Marital status: Married    Spouse name: Not on file  . Number of children: Not on file  . Years of education: Not on file  . Highest education level: Not on file  Occupational History  . Not on file  Social Needs  . Financial resource strain: Not on file  . Food insecurity    Worry: Not on file    Inability: Not on file  . Transportation needs    Medical: Not on file    Non-medical: Not on file  Tobacco Use  . Smoking status: Current Every Day Smoker    Packs/day: 0.50    Types: Cigarettes  . Smokeless tobacco: Never Used  Substance and Sexual Activity  . Alcohol use: Not  Currently  . Drug use: Never  . Sexual activity: Yes    Birth control/protection: None  Lifestyle  . Physical activity    Days per week: Not on file    Minutes per session: Not on file  . Stress: Not on file  Relationships  . Social Herbalist on phone: Not on file    Gets together: Not on file    Attends religious service: Not on file    Active member of club or organization: Not on file    Attends meetings of clubs or organizations: Not on file    Relationship status: Not on file  . Intimate partner violence    Fear of current or ex partner: Not on file    Emotionally abused: Not on file    Physically abused: Not on file    Forced sexual activity: Not on file  Other Topics Concern  . Not on file  Social History Narrative  . Not on file    Allergies  Allergen Reactions  . Seroquel [Quetiapine Fumarate] Other (See Comments)    nightmares    Prior to Admission  medications   Medication Sig Start Date End Date Taking? Authorizing Provider  gabapentin (NEURONTIN) 300 MG capsule Take 1 capsule (300 mg total) by mouth 2 (two) times daily. 02/03/19  Yes Boscia, Greer Ee, NP  hydrOXYzine (ATARAX/VISTARIL) 50 MG tablet Take 1 tablet (50 mg total) by mouth 3 (three) times daily as needed. 02/03/19  Yes Ronnell Freshwater, NP  levothyroxine (SYNTHROID) 200 MCG tablet Take 1 tablet (200 mcg total) by mouth daily before breakfast. 02/03/19  Yes Boscia, Heather E, NP  rOPINIRole (REQUIP) 0.5 MG tablet Take 1 tablet (0.5 mg total) by mouth at bedtime. 02/03/19  Yes Boscia, Heather E, NP  tiZANidine (ZANAFLEX) 4 MG tablet Take 1/2 to 1 tablet po BID prn muscle pain/spasms. 02/03/19  Yes Boscia, Greer Ee, NP  zolpidem (AMBIEN CR) 12.5 MG CR tablet Take 1 tablet (12.5 mg total) by mouth at bedtime as needed for sleep. 02/03/19  Yes Ronnell Freshwater, NP    Review of Systems  Constitutional: Negative.   HENT: Negative.   Eyes: Negative.   Respiratory: Negative.   Cardiovascular: Negative.   Gastrointestinal: Negative.   Genitourinary: Negative.   Musculoskeletal: Negative.   Skin: Negative.   Neurological: Negative.   Psychiatric/Behavioral: Negative.      Physical Exam BP 125/73 (BP Location: Left Arm, Patient Position: Sitting, Cuff Size: Large)   Pulse 87   Ht 5\' 6"  (1.676 m)   Wt 225 lb (102.1 kg)   BMI 36.32 kg/m  No LMP recorded. Physical Exam Constitutional:      General: She is not in acute distress.    Appearance: Normal appearance. She is well-developed.  Genitourinary:     Pelvic exam was performed with patient in the lithotomy position.     Vulva, inguinal canal, urethra, bladder, vagina, uterus, right adnexa and left adnexa normal.     No posterior fourchette tenderness, injury or lesion present.     No cervical friability, lesion, bleeding or polyp.  HENT:     Head: Normocephalic and atraumatic.  Eyes:     General: No scleral icterus.     Conjunctiva/sclera: Conjunctivae normal.  Neck:     Musculoskeletal: Normal range of motion and neck supple.  Cardiovascular:     Rate and Rhythm: Normal rate and regular rhythm.     Heart sounds: No murmur. No friction  rub. No gallop.   Pulmonary:     Effort: Pulmonary effort is normal. No respiratory distress.     Breath sounds: Normal breath sounds. No wheezing or rales.  Abdominal:     General: Bowel sounds are normal. There is no distension.     Palpations: Abdomen is soft. There is no mass.     Tenderness: There is no abdominal tenderness. There is no guarding or rebound.  Musculoskeletal: Normal range of motion.  Neurological:     General: No focal deficit present.     Mental Status: She is alert and oriented to person, place, and time.     Cranial Nerves: No cranial nerve deficit.  Skin:    General: Skin is warm and dry.     Findings: No erythema.  Psychiatric:        Mood and Affect: Mood normal.        Behavior: Behavior normal.        Judgment: Judgment normal.   Endometrial Biopsy After discussion with the patient regarding her abnormal uterine bleeding I recommended that she proceed with an endometrial biopsy for further diagnosis. The risks, benefits, alternatives, and indications for an endometrial biopsy were discussed with the patient in detail. She understood the risks including infection, bleeding, cervical laceration and uterine perforation.  Verbal consent was obtained.   PROCEDURE NOTE:  Pipelle endometrial biopsy was performed using aseptic technique with iodine preparation.  The uterus was sounded to a length of 8 cm.  Adequate sampling was obtained with minimal blood loss.  The patient tolerated the procedure well.  Disposition will be pending pathology.  Female chaperone present for pelvic and breast  portions of the physical exam  Assessment: 48 y.o. G3P3 female here for  1. Menorrhagia with irregular cycle   2. Subserous leiomyoma of uterus   3. Right  ovarian cyst      Plan: Problem List Items Addressed This Visit    None    Visit Diagnoses    Menorrhagia with irregular cycle    -  Primary   Relevant Orders   Surgical pathology   Subserous leiomyoma of uterus       Right ovarian cyst         For her menorrhagia with irregular cycle we discussed potential etiologies.  No obvious etiology noted on ultrasound.  Given the location of her fibroid, it appears to be unlikely to be related to her heavy bleeding.  There appears to be no other structural component that could explain her heavy bleeding.  She also had a normal Pap smear several months ago.  An endometrial biopsy was obtained today for further evaluation.  She does not appear to be taking any medications that would cause her bleeding to be increased.  We will discuss further once pathology returns on her endometrial biopsy.  Fibroid uterus: This may be the source of her pain.  We discussed that there would be no way to know for sure whether this was causing her pain or not.  Since her pain occurs without menstruation, simply stopping her bleeding or lessening her bleeding may not be the solution that would help with her pain issue.  It could be that the location of the fibroid is impinging on a nerve or is causing a mass-effect in her lower pelvis.  Her ovarian cyst is likely an incidental finding.  We discussed in great detail detail the different methods of treating heavy menstrual bleeding.  We discussed observational treatment with  no active therapy.  We discussed medication treatment with something like an IUD.  We discussed surgical treatment with options like ablation, hysterectomy, uterine fibroid embolization.  She seems most interested in hysterectomy.  We discussed that the uterine fibroid embolization and hysterectomy were the only 2 options likely to get rid of her fibroid, if that was causing her pain.  30 minutes spent in face to face discussion with > 50% spent in  counseling,management, and coordination of care of her menorrhagia with irregular cycle and fibroid uterus. Prentice Docker, MD 02/20/2019 5:09 PM    CC: Ronnell Freshwater, NP [address] [place],  [place] [address]

## 2019-02-24 ENCOUNTER — Telehealth: Payer: Self-pay | Admitting: Obstetrics and Gynecology

## 2019-02-24 LAB — SURGICAL PATHOLOGY

## 2019-02-24 NOTE — Telephone Encounter (Signed)
Left generic voicemail

## 2019-02-25 NOTE — Telephone Encounter (Signed)
Patient is returning call from [facility]. Please advise

## 2019-02-25 NOTE — Telephone Encounter (Signed)
Let SDJ know when pt returns his call

## 2019-03-06 ENCOUNTER — Other Ambulatory Visit: Payer: Self-pay | Admitting: Nurse Practitioner

## 2019-03-07 LAB — TSH: TSH: 0.044 u[IU]/mL — ABNORMAL LOW (ref 0.450–4.500)

## 2019-03-07 LAB — CBC
Hematocrit: 41.9 % (ref 34.0–46.6)
Hemoglobin: 13.8 g/dL (ref 11.1–15.9)
MCH: 28 pg (ref 26.6–33.0)
MCHC: 32.9 g/dL (ref 31.5–35.7)
MCV: 85 fL (ref 79–97)
Platelets: 484 10*3/uL — ABNORMAL HIGH (ref 150–450)
RBC: 4.92 x10E6/uL (ref 3.77–5.28)
RDW: 12.7 % (ref 11.7–15.4)
WBC: 12.9 10*3/uL — ABNORMAL HIGH (ref 3.4–10.8)

## 2019-03-07 LAB — RHEUMATOID FACTOR: Rheumatoid fact SerPl-aCnc: <10 IU/mL (ref 0.0–13.9)

## 2019-03-07 LAB — T4, FREE: Free T4: 1.51 ng/dL (ref 0.82–1.77)

## 2019-03-07 LAB — ANA: Anti Nuclear Antibody (ANA): NEGATIVE

## 2019-03-07 LAB — SEDIMENTATION RATE: Sed Rate: 27 mm/hr (ref 0–32)

## 2019-03-11 ENCOUNTER — Ambulatory Visit: Payer: Self-pay | Admitting: Nurse Practitioner

## 2019-03-11 ENCOUNTER — Other Ambulatory Visit: Payer: Self-pay

## 2019-03-11 ENCOUNTER — Encounter: Payer: Self-pay | Admitting: Nurse Practitioner

## 2019-03-11 VITALS — BP 138/81 | HR 78 | Temp 97.6°F | Resp 16 | Ht 66.0 in | Wt 228.0 lb

## 2019-03-11 DIAGNOSIS — B3731 Acute candidiasis of vulva and vagina: Secondary | ICD-10-CM

## 2019-03-11 DIAGNOSIS — B373 Candidiasis of vulva and vagina: Secondary | ICD-10-CM

## 2019-03-11 DIAGNOSIS — L0231 Cutaneous abscess of buttock: Secondary | ICD-10-CM

## 2019-03-11 DIAGNOSIS — E069 Thyroiditis, unspecified: Secondary | ICD-10-CM

## 2019-03-11 DIAGNOSIS — G2581 Restless legs syndrome: Secondary | ICD-10-CM

## 2019-03-11 MED ORDER — FLUCONAZOLE 150 MG PO TABS: ORAL_TABLET | ORAL | 1 refills | Status: DC

## 2019-03-11 MED ORDER — DOXYCYCLINE HYCLATE 100 MG PO TABS: 100.0000 mg | ORAL_TABLET | Freq: Two times a day (BID) | ORAL | 0 refills | Status: DC

## 2019-03-11 MED ORDER — ROPINIROLE HCL 1 MG PO TABS: 1.0000 mg | ORAL_TABLET | Freq: Every day | ORAL | 3 refills | Status: DC

## 2019-03-11 NOTE — Progress Notes (Signed)
[facility] [place], [place] [address]  Internal MEDICINE  Office Visit Note  Patient Name: Stephanie Larson  [id number]  [id number]  Date of Service: 03/11/2019  Chief Complaint  Patient presents with  . Recurrent Skin Infections    on buttocks, itching, burning, painful     The patient is here for sick visit. States that she has boil or insect bite on left side of her bottom. Is itchy and tender. Does not remember any insect bite. States it has gotten larger over past few days.  She also states that current dose of requip is no longer effective. Has bee ntaking two at night. States that this helps RLS much more.  Lab review. TSH 0.044 with normal T4. Currently on levothyroxine 225mcg daily. Her other labs were normal. She continues to feel very fatigued and has constant, generalized joint pain.   Pt is here for a sick visit.     Current Medication:  Outpatient Encounter Medications as of 03/11/2019  Medication Sig  . gabapentin (NEURONTIN) 300 MG capsule Take 1 capsule (300 mg total) by mouth 2 (two) times daily.  . hydrOXYzine (ATARAX/VISTARIL) 50 MG tablet Take 1 tablet (50 mg total) by mouth 3 (three) times daily as needed.  Marland Kitchen levothyroxine (SYNTHROID) 200 MCG tablet Take 1 tablet (200 mcg total) by mouth daily before breakfast.  . rOPINIRole (REQUIP) 1 MG tablet Take 1 tablet (1 mg total) by mouth at bedtime.  Marland Kitchen tiZANidine (ZANAFLEX) 4 MG tablet Take 1/2 to 1 tablet po BID prn muscle pain/spasms.  Marland Kitchen zolpidem (AMBIEN CR) 12.5 MG CR tablet Take 1 tablet (12.5 mg total) by mouth at bedtime as needed for sleep.  . [DISCONTINUED] rOPINIRole (REQUIP) 0.5 MG tablet Take 1 tablet (0.5 mg total) by mouth at bedtime.  Marland Kitchen doxycycline (VIBRA-TABS) 100 MG tablet Take 1 tablet (100 mg total) by mouth 2 (two) times daily.  . fluconazole (DIFLUCAN) 150 MG tablet Take 1 tablet po once. May repeat dose in 3 days as needed for persistent symptoms.   No  facility-administered encounter medications on file as of 03/11/2019.       Medical History: Past Medical History:  Diagnosis Date  . Anxiety   . Depression   . Insomnia   . Memory changes   . Sinus drainage   . Sleep apnea   . Thyroid disease      Today's Vitals   03/11/19 0831  BP: 138/81  Pulse: 78  Resp: 16  Temp: 97.6 F (36.4 C)  SpO2: 97%  Weight: 228 lb (103.4 kg)  Height: 5\' 6"  (1.676 m)   Body mass index is 36.8 kg/m.  Review of Systems  Constitutional: Positive for fatigue. Negative for chills and diaphoresis.  HENT: Negative for ear pain, postnasal drip and sinus pressure.   Respiratory: Negative for cough, shortness of breath and wheezing.   Cardiovascular: Negative for chest pain and palpitations.  Gastrointestinal: Negative for abdominal pain, constipation, diarrhea, nausea and vomiting.  Endocrine: Negative for cold intolerance, heat intolerance, polydipsia and polyuria.       Well managed thyroid disease  Genitourinary:       .  Musculoskeletal: Positive for arthralgias, back pain and myalgias. Negative for gait problem and neck pain.  Skin: Negative for color change.       Boil or insect bite on the left buttock. Tender and red. Itches some .  Allergic/Immunologic: Negative for environmental allergies and food allergies.  Neurological: Positive for headaches.  Negative for dizziness.  Hematological: Does not bruise/bleed easily.  Psychiatric/Behavioral: Positive for dysphoric mood and sleep disturbance. Negative for agitation, behavioral problems (depression) and hallucinations. The patient is nervous/anxious.     Physical Exam Vitals signs and nursing note reviewed.  Constitutional:      General: She is not in acute distress.    Appearance: Normal appearance. She is well-developed. She is not diaphoretic.  HENT:     Head: Normocephalic and atraumatic.     Mouth/Throat:     Pharynx: No oropharyngeal exudate.  Eyes:     Pupils: Pupils are  equal, round, and reactive to light.  Neck:     Musculoskeletal: Normal range of motion and neck supple.     Thyroid: No thyromegaly.     Vascular: No JVD.     Trachea: No tracheal deviation.  Cardiovascular:     Rate and Rhythm: Normal rate and regular rhythm.     Heart sounds: Normal heart sounds. No murmur. No friction rub. No gallop.   Pulmonary:     Effort: Pulmonary effort is normal. No respiratory distress.     Breath sounds: Normal breath sounds. No wheezing or rales.  Chest:     Chest wall: No tenderness.  Abdominal:     Palpations: Abdomen is soft.  Musculoskeletal: Normal range of motion.  Lymphadenopathy:     Cervical: No cervical adenopathy.  Skin:    General: Skin is warm and dry.     Comments: There is small, infected lesion on the left buttock. It is pink, warm, and tender to touch. Skin is intact. No drainage present.   Neurological:     Mental Status: She is alert and oriented to person, place, and time.     Cranial Nerves: No cranial nerve deficit.  Psychiatric:        Behavior: Behavior normal.        Thought Content: Thought content normal.        Judgment: Judgment normal.    Assessment/Plan: 1. Abscess of buttock, left Start doxycycline 100mg  twice daily for 14 days. Apply warm compress to affected area and  Use previously prescribed viscous lidocaine on affected area as needed for pain.  - doxycycline (VIBRA-TABS) 100 MG tablet; Take 1 tablet (100 mg total) by mouth 2 (two) times daily.  Dispense: 28 tablet; Refill: 0  2. Vaginal candidiasis Diflucan should be taken as needed and as prescribed if yeast infection develops.  - fluconazole (DIFLUCAN) 150 MG tablet; Take 1 tablet po once. May repeat dose in 3 days as needed for persistent symptoms.  Dispense: 3 tablet; Refill: 1  3. Restless leg syndrome Increase ropinirole to 1mg  every evening.  - rOPINIRole (REQUIP) 1 MG tablet; Take 1 tablet (1 mg total) by mouth at bedtime.  Dispense: 30 tablet;  Refill: 3  4. Thyroiditis Will get ultrasound of thyroid for further evaluation.  - US Soft Tissue Head/Neck; Future  General Counseling: Stephanie Larson verbalizes understanding of the findings of todays visit and agrees with plan of treatment. I have discussed any further diagnostic evaluation that may be needed or ordered today. We also reviewed her medications today. she has been encouraged to call the office with any questions or concerns that should arise related to todays visit.    Counseling:  This patient was seen by Leretha Pol FNP Collaboration with Dr Lavera Guise as a part of collaborative care agreement  Orders Placed This Encounter  Procedures  . US Soft Tissue Head/Neck  Meds ordered this encounter  Medications  . doxycycline (VIBRA-TABS) 100 MG tablet    Sig: Take 1 tablet (100 mg total) by mouth 2 (two) times daily.    Dispense:  28 tablet    Refill:  0    Order Specific Question:   Supervising Provider    Answer:   Lavera Guise [id number]  . fluconazole (DIFLUCAN) 150 MG tablet    Sig: Take 1 tablet po once. May repeat dose in 3 days as needed for persistent symptoms.    Dispense:  3 tablet    Refill:  1    Order Specific Question:   Supervising Provider    Answer:   Lavera Guise [id number]  . rOPINIRole (REQUIP) 1 MG tablet    Sig: Take 1 tablet (1 mg total) by mouth at bedtime.    Dispense:  30 tablet    Refill:  3    Please note increased dose    Order Specific Question:   Supervising Provider    Answer:   Lavera Guise [id number]    Time spent: 25 Minutes

## 2019-03-12 NOTE — Telephone Encounter (Signed)
Left generic VM 

## 2019-03-19 ENCOUNTER — Other Ambulatory Visit: Payer: Self-pay

## 2019-03-19 DIAGNOSIS — G8929 Other chronic pain: Secondary | ICD-10-CM

## 2019-03-19 MED ORDER — TIZANIDINE HCL 4 MG PO TABS: ORAL_TABLET | ORAL | 0 refills | Status: DC

## 2019-03-20 ENCOUNTER — Ambulatory Visit (INDEPENDENT_AMBULATORY_CARE_PROVIDER_SITE_OTHER): Payer: Self-pay | Admitting: Obstetrics and Gynecology

## 2019-03-20 ENCOUNTER — Other Ambulatory Visit: Payer: Self-pay

## 2019-03-20 ENCOUNTER — Encounter: Payer: Self-pay | Admitting: Obstetrics and Gynecology

## 2019-03-20 VITALS — BP 140/80 | Ht 66.0 in | Wt 227.0 lb

## 2019-03-20 DIAGNOSIS — D252 Subserosal leiomyoma of uterus: Secondary | ICD-10-CM

## 2019-03-20 DIAGNOSIS — N920 Excessive and frequent menstruation with regular cycle: Secondary | ICD-10-CM

## 2019-03-20 DIAGNOSIS — N83201 Unspecified ovarian cyst, right side: Secondary | ICD-10-CM

## 2019-03-20 MED ORDER — MEDROXYPROGESTERONE ACETATE 10 MG PO TABS: 10.0000 mg | ORAL_TABLET | Freq: Every day | ORAL | 0 refills | Status: DC

## 2019-03-20 NOTE — Progress Notes (Signed)
Obstetrics & Gynecology Office Visit   Chief Complaint  Patient presents with  . Follow-up  Menorrhagia with irregular cycle, subserous fibroid   History of Present Illness: 48 y.o. G3P3 female who presents in follow up from a visit on 10/22 to discuss management options.  She has had a negative endometrial biopsy and normal pap smear.     Past Medical History:  Diagnosis Date  . Anxiety   . Depression   . Insomnia   . Memory changes   . Sinus drainage   . Sleep apnea   . Thyroid disease     Past Surgical History:  Procedure Laterality Date  . LAPAROSCOPIC GASTRIC BANDING    . TUBAL LIGATION      Gynecologic History: Patient's last menstrual period was 03/18/2019 (exact date).  Obstetric History: G3P3  Family History  Problem Relation Age of Onset  . Colon cancer Paternal Grandfather   . Ovarian cancer Paternal Grandmother   . Hypertension Maternal Grandmother   . Hypertension Father   . Emphysema Father   . ALS Mother   . Hypertension Mother     Social History   Socioeconomic History  . Marital status: Married    Spouse name: Not on file  . Number of children: Not on file  . Years of education: Not on file  . Highest education level: Not on file  Occupational History  . Not on file  Social Needs  . Financial resource strain: Not on file  . Food insecurity    Worry: Not on file    Inability: Not on file  . Transportation needs    Medical: Not on file    Non-medical: Not on file  Tobacco Use  . Smoking status: Current Every Day Smoker    Packs/day: 0.50    Types: Cigarettes  . Smokeless tobacco: Never Used  Substance and Sexual Activity  . Alcohol use: Not Currently  . Drug use: Never  . Sexual activity: Yes    Birth control/protection: None  Lifestyle  . Physical activity    Days per week: Not on file    Minutes per session: Not on file  . Stress: Not on file  Relationships  . Social Herbalist on phone: Not on file    Gets  together: Not on file    Attends religious service: Not on file    Active member of club or organization: Not on file    Attends meetings of clubs or organizations: Not on file    Relationship status: Not on file  . Intimate partner violence    Fear of current or ex partner: Not on file    Emotionally abused: Not on file    Physically abused: Not on file    Forced sexual activity: Not on file  Other Topics Concern  . Not on file  Social History Narrative  . Not on file    Allergies  Allergen Reactions  . Seroquel [Quetiapine Fumarate] Other (See Comments)    nightmares    Prior to Admission medications   Medication Sig Start Date End Date Taking? Authorizing Provider  doxycycline (VIBRA-TABS) 100 MG tablet Take 1 tablet (100 mg total) by mouth 2 (two) times daily. 03/11/19  Yes Boscia, Greer Ee, NP  gabapentin (NEURONTIN) 300 MG capsule Take 1 capsule (300 mg total) by mouth 2 (two) times daily. 02/03/19  Yes Boscia, Greer Ee, NP  hydrOXYzine (ATARAX/VISTARIL) 50 MG tablet Take 1 tablet (50 mg total) by  mouth 3 (three) times daily as needed. 02/03/19  Yes Ronnell Freshwater, NP  levothyroxine (SYNTHROID) 200 MCG tablet Take 1 tablet (200 mcg total) by mouth daily before breakfast. 02/03/19  Yes Boscia, Heather E, NP  rOPINIRole (REQUIP) 1 MG tablet Take 1 tablet (1 mg total) by mouth at bedtime. 03/11/19  Yes Boscia, Heather E, NP  tiZANidine (ZANAFLEX) 4 MG tablet Take 1/2 to 1 tablet po BID prn muscle pain/spasms. 03/19/19  Yes Boscia, Greer Ee, NP  zolpidem (AMBIEN CR) 12.5 MG CR tablet Take 1 tablet (12.5 mg total) by mouth at bedtime as needed for sleep. 02/03/19  Yes Boscia, Greer Ee, NP  fluconazole (DIFLUCAN) 150 MG tablet Take 1 tablet po once. May repeat dose in 3 days as needed for persistent symptoms. Patient not taking: Reported on 03/20/2019 03/11/19   Ronnell Freshwater, NP    Review of Systems  Constitutional: Negative.   HENT: Negative.   Eyes: Negative.    Respiratory: Negative.   Cardiovascular: Negative.   Gastrointestinal: Negative.   Genitourinary: Negative.   Musculoskeletal: Negative.   Skin: Negative.   Neurological: Negative.   Psychiatric/Behavioral: Negative.      Physical Exam BP 140/80   Ht 5\' 6"  (1.676 m)   Wt 227 lb (103 kg)   LMP 03/18/2019 (Exact Date)   BMI 36.64 kg/m  Patient's last menstrual period was 03/18/2019 (exact date). Physical Exam Constitutional:      General: She is not in acute distress.    Appearance: Normal appearance.  HENT:     Head: Normocephalic and atraumatic.  Eyes:     General: No scleral icterus.    Conjunctiva/sclera: Conjunctivae normal.  Neurological:     General: No focal deficit present.     Mental Status: She is alert and oriented to person, place, and time.     Cranial Nerves: No cranial nerve deficit.  Psychiatric:        Mood and Affect: Mood normal.        Behavior: Behavior normal.        Judgment: Judgment normal.     Female chaperone present for pelvic and breast  portions of the physical exam  Assessment: 48 y.o. G3P3 female here for  1. Menorrhagia with regular cycle   2. Subserous leiomyoma of uterus   3. Right ovarian cyst      Plan: Problem List Items Addressed This Visit      Other   Menorrhagia with regular cycle - Primary   Relevant Medications   medroxyPROGESTERone (PROVERA) 10 MG tablet    Other Visit Diagnoses    Subserous leiomyoma of uterus       Right ovarian cyst         Reviewed treatment options.  Patient would like to proceed with hysterectomy.  Discussed hysterectomy in detail.  We will provide small amount of Provera until she can have her surgery.  15 minutes spent in face to face discussion with > 50% spent in counseling,management, and coordination of care of her menorrhagia with irregular cycle and subserous fibroid uterus.   Prentice Docker, MD 03/20/2019 8:51 AM

## 2019-03-24 ENCOUNTER — Telehealth: Payer: Self-pay | Admitting: Obstetrics and Gynecology

## 2019-03-24 NOTE — Telephone Encounter (Signed)
-----   Message from Will Bonnet, MD sent at 03/20/2019  8:41 AM EST ----- Regarding: Schedule Surgery Surgery Booking Request Patient Full Name:  Stephanie Larson  MRN: [id number]  DOB: 02-03-1971  Surgeon: Prentice Docker, MD  Requested Surgery Date and Time: TBD Primary Diagnosis AND Code:  1. Menorrhagia with regular cycle (N92.0) 2. Subserous leiomyoma of uterus (D25.2) 3. Right ovarian cyst (N83.201)   Secondary Diagnosis and Code:  Surgical Procedure: TLH/BS/Cysto L&D Notification: No Admission Status: same day surgery Length of Surgery: 2 hours Special Case Needs: No H&P: Yes Phone Interview???:  No Interpreter: No Language:  Medical Clearance:  No Special Scheduling Instructions: none Any known health/anesthesia issues, diabetes, sleep apnea, latex allergy, defibrillator/pacemaker?: No Acuity: P3   (P1 highest, P2 delay may cause harm, P3 low, elective gyn, P4 lowest)

## 2019-03-24 NOTE — Telephone Encounter (Signed)
Lmtrc

## 2019-03-28 IMAGING — CR DG SHOULDER 2+V*L*
1 series · 5 of 5 positions shown · non-contrast
Comparison: Chest x-ray dated March 06, 2016 which included a
portion of the right shoulder.

CLINICAL DATA: Left shoulder pain for the past 9 months with no
known injury. Pain radiates into the humerus.

EXAM:
LEFT SHOULDER - 2+ VIEW

[Series 1: dg shoulder left · 0.14mm/px · 5 of 5 slices shown]
[im 1/5]
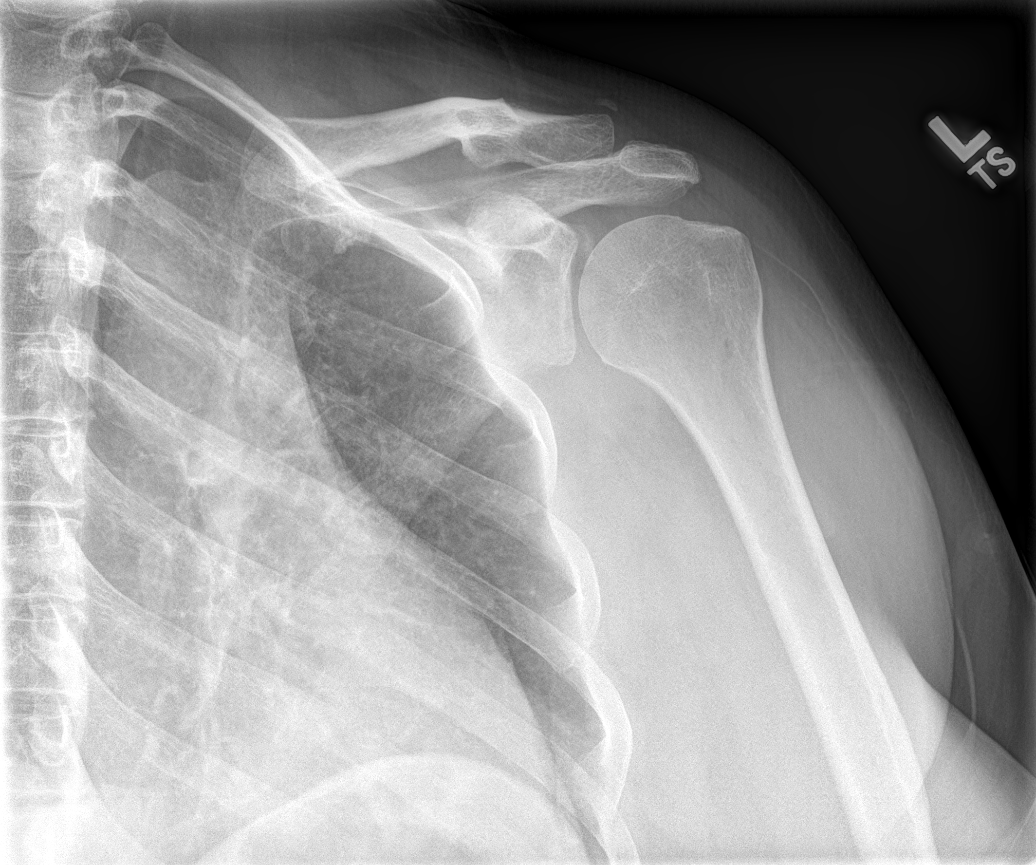
[im 2/5]
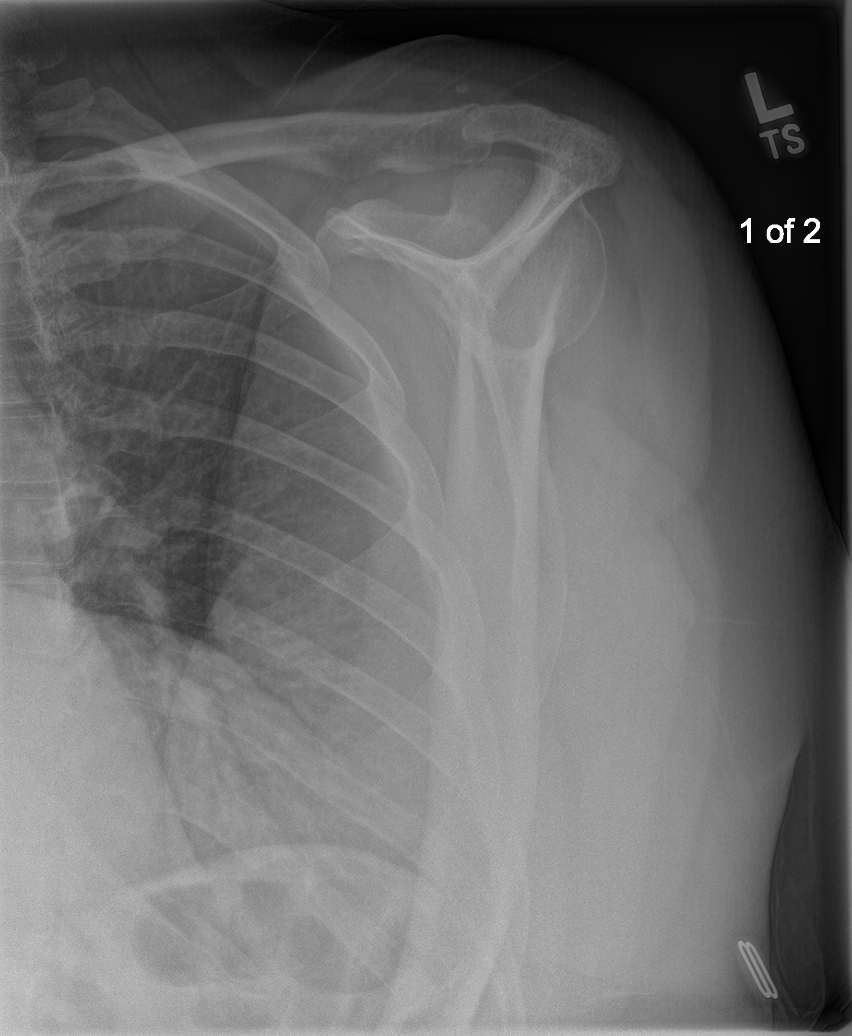
[im 3/5]
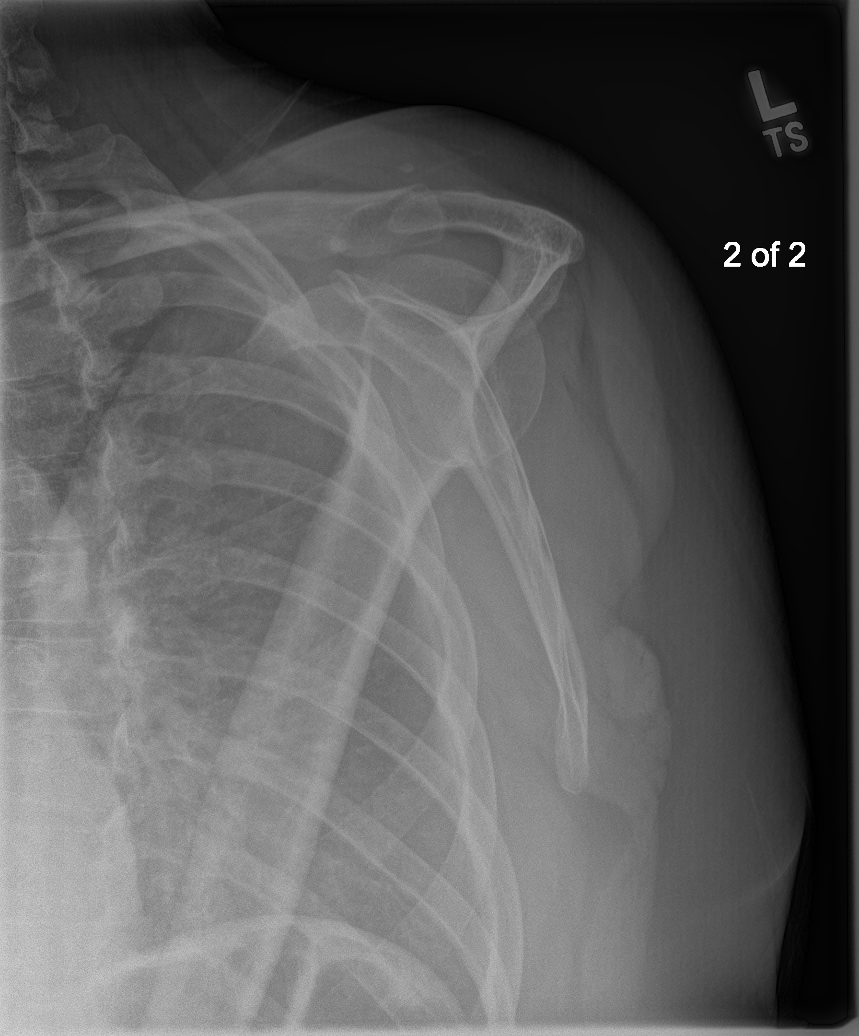
[im 4/5]
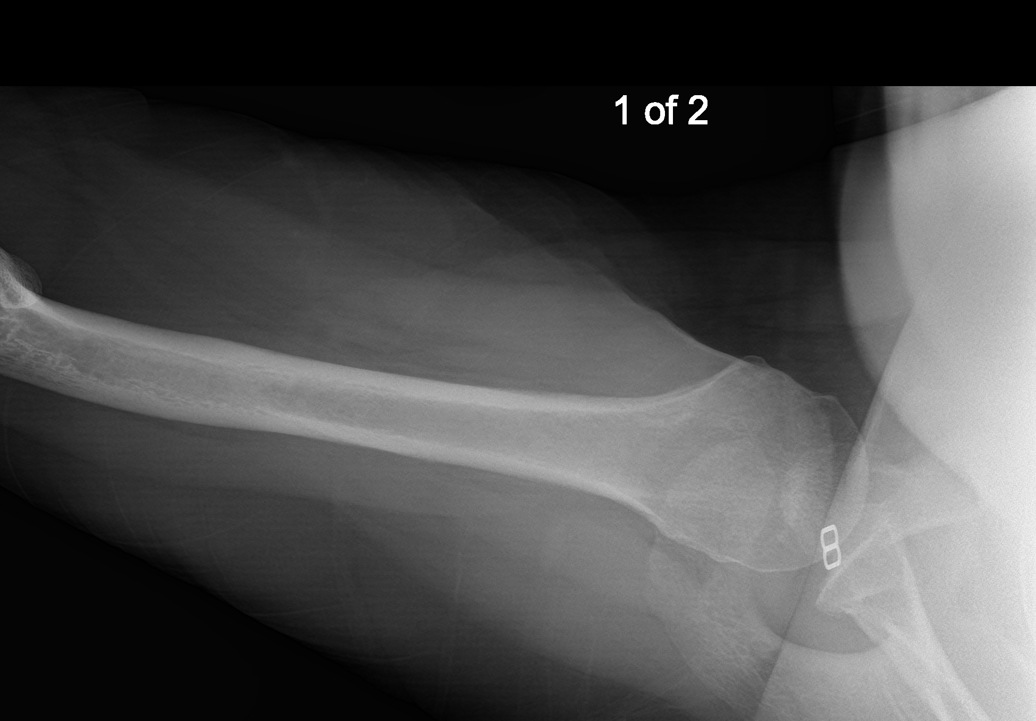
[im 5/5]
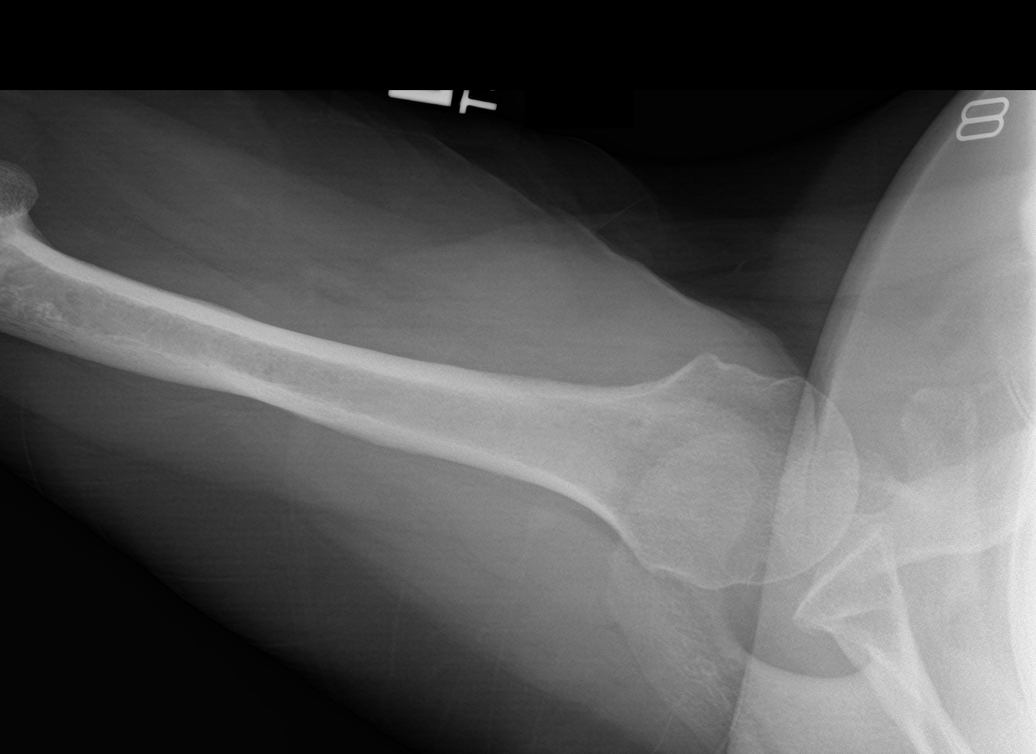

[5 of 5 positions shown; findings below may reference images not displayed]

FINDINGS: The bones are subjectively adequately mineralized. The glenohumeral
joint space is reasonably well-maintained. The subacromial
subdeltoid space is normal. There is calcification and along the
superior aspect of the AC joint. The AC joint space is reasonably
well-maintained. There is no acute or old fracture or dislocation.
IMPRESSION: No acute bony abnormality of the left shoulder. Mild degenerative
change of the AC joint is suspected.

## 2019-03-28 IMAGING — CR DG SHOULDER 2+V*R*
1 series · 3 of 3 positions shown · non-contrast
Comparison: Chest x-ray dated March 06, 2016 which included
portions of the right shoulder.

CLINICAL DATA: Nine month history of right shoulder pain radiating
into the right humerus. No known injury.

EXAM:
RIGHT SHOULDER - 2+ VIEW

[Series 1: dg shoulder right · 0.14mm/px · 3 of 3 slices shown]
[im 1/3]
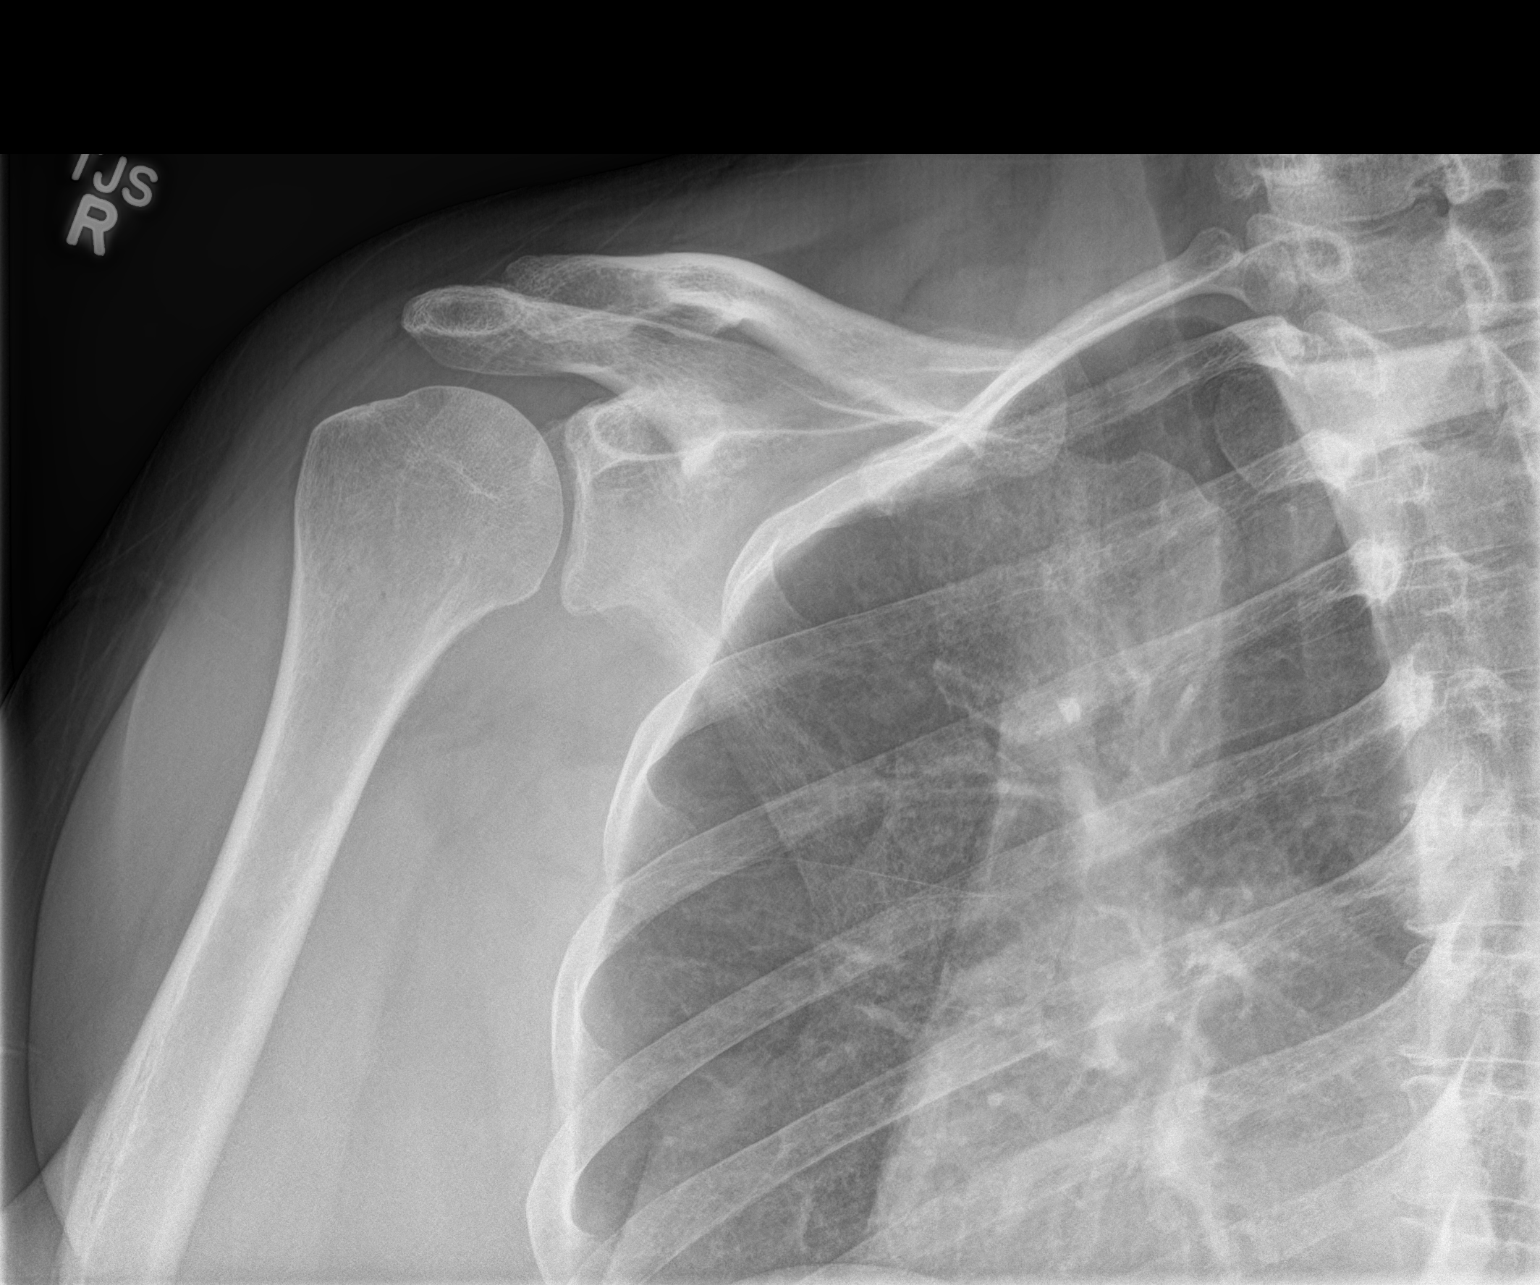
[im 2/3]
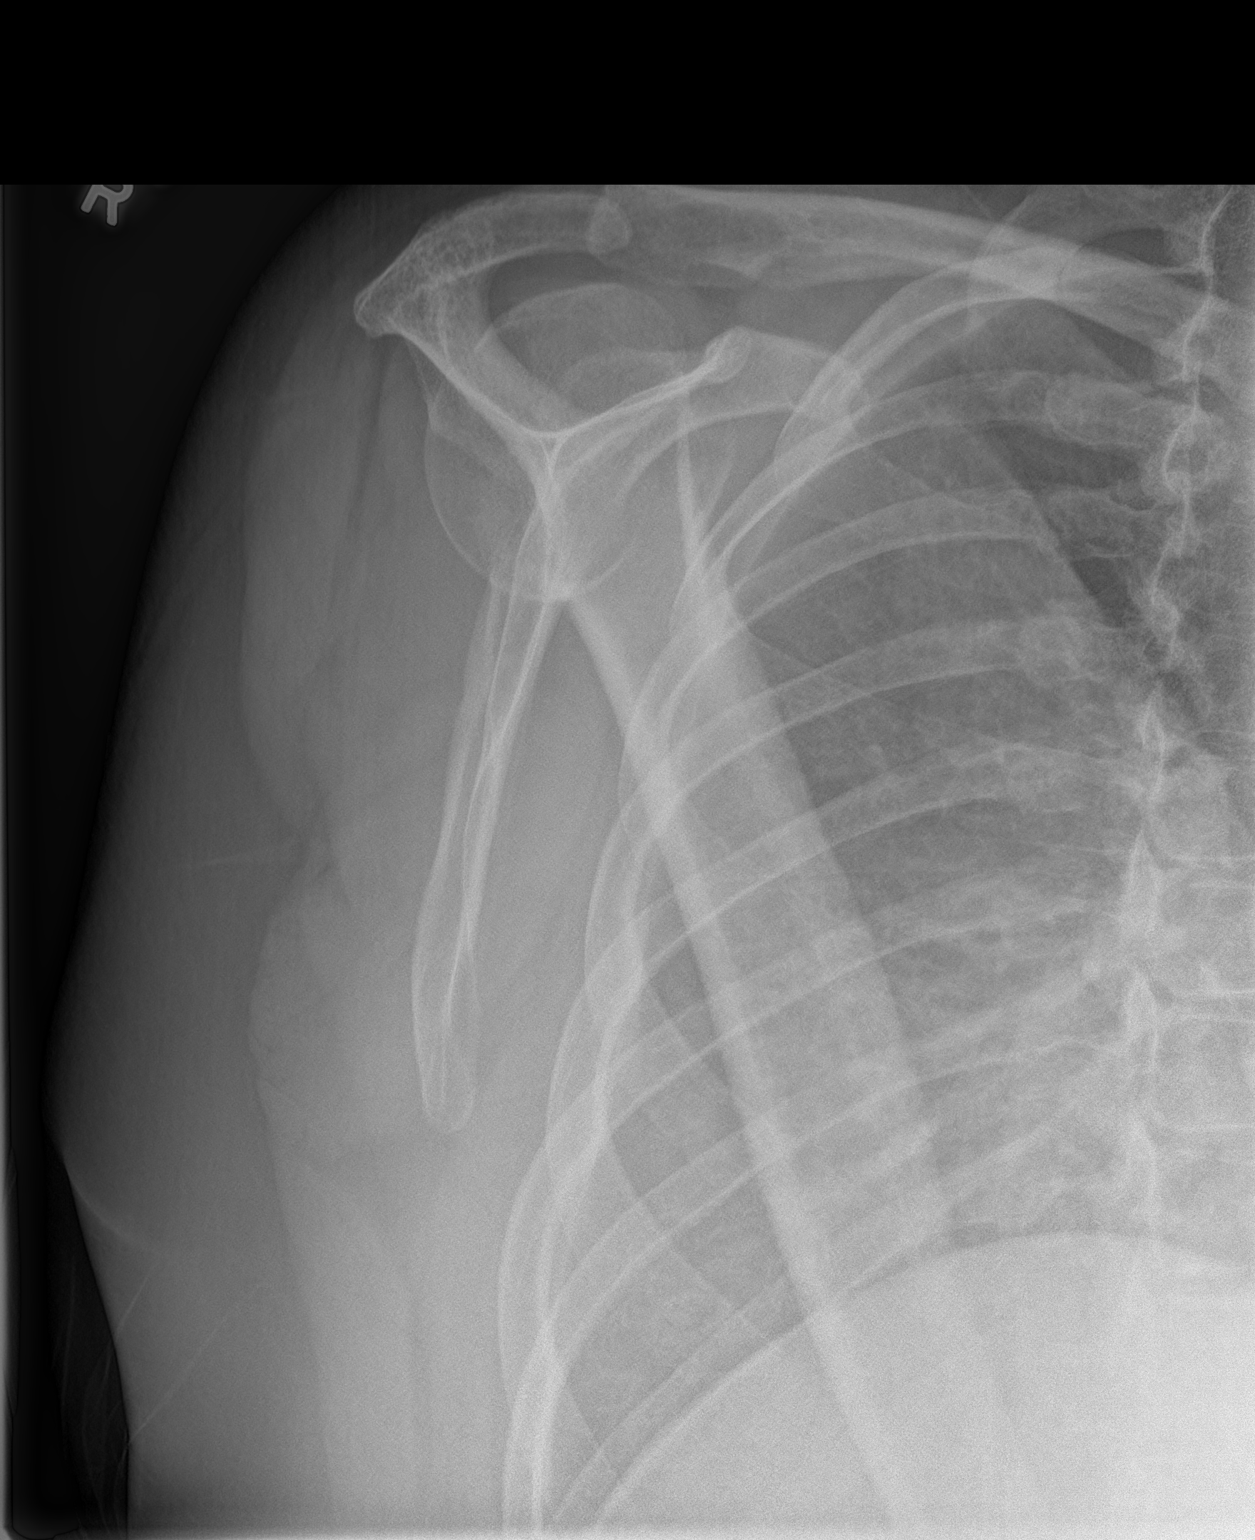
[im 3/3]
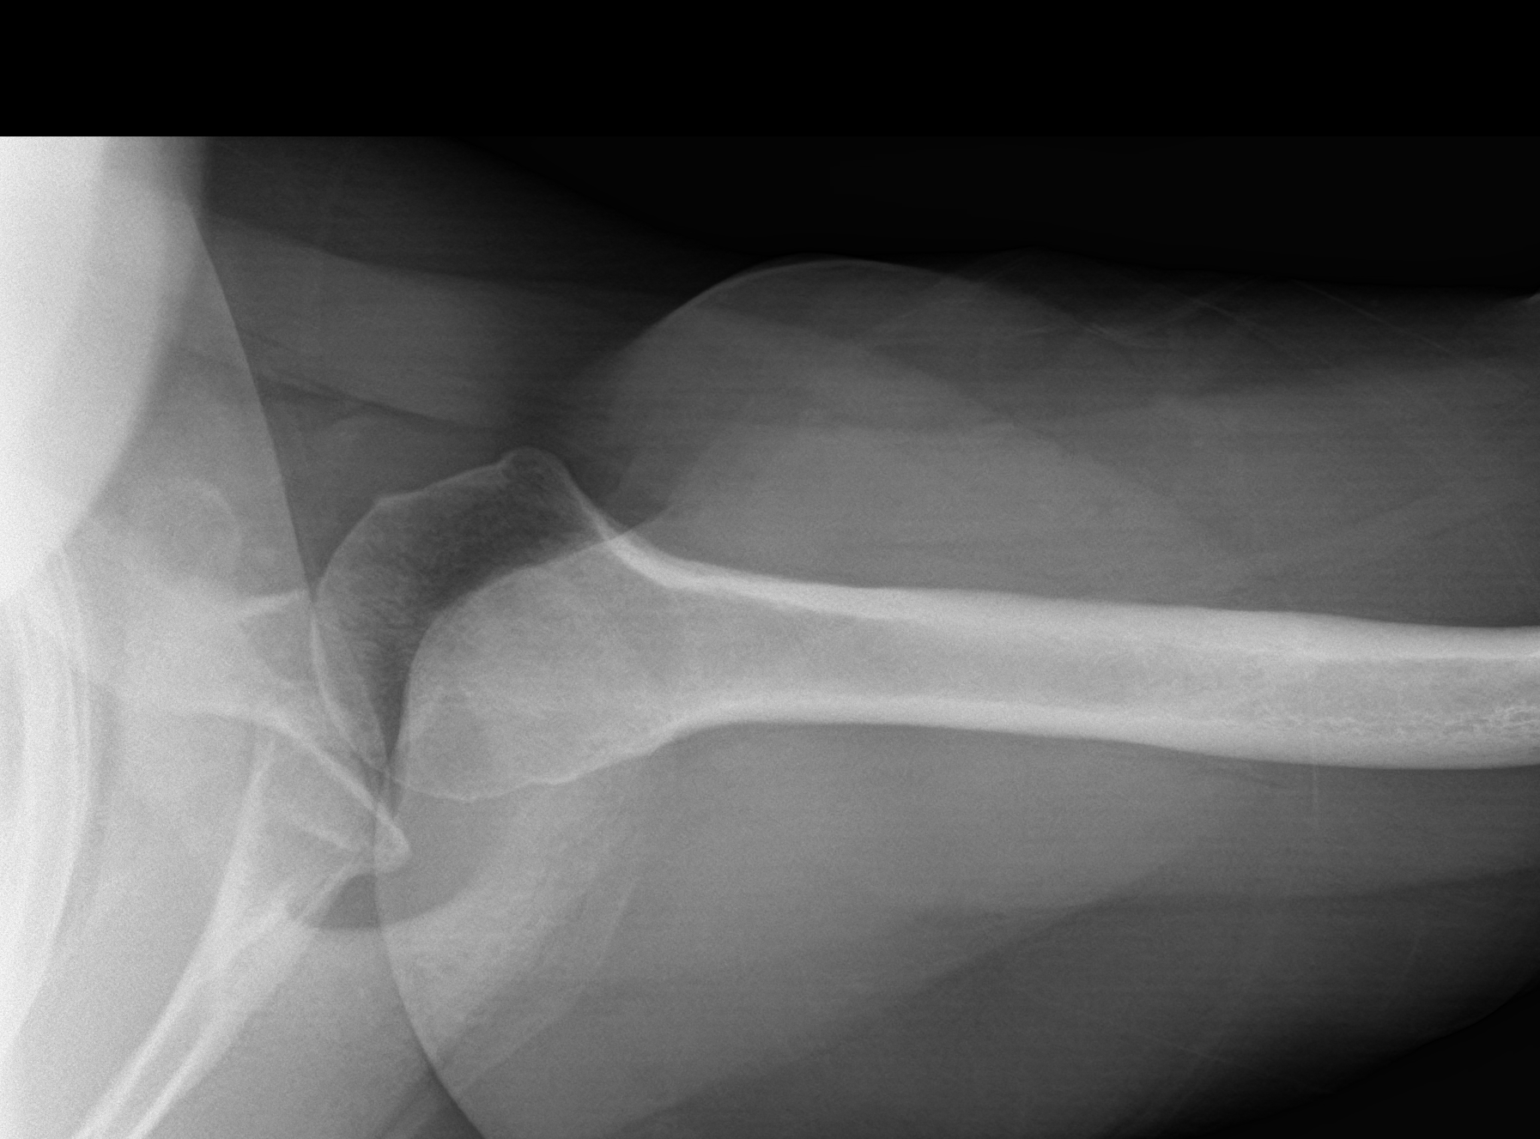

[3 of 3 positions shown; findings below may reference images not displayed]

FINDINGS: The bones are subjectively adequately mineralized. The glenohumeral
joint space is well maintained. There is mild narrowing of the AC
joint. The subacromial subdeltoid space appears normal.
IMPRESSION: There is mild degenerative change centered on the AC joint. No
significant glenohumeral joint abnormality is observed. There is no
acute bony abnormality.

## 2019-04-01 ENCOUNTER — Ambulatory Visit: Payer: Self-pay | Admitting: Nurse Practitioner

## 2019-04-01 NOTE — Telephone Encounter (Signed)
Patient is aware of H&P at [facility] on 05/05/19 @ 4:10pm w/ Dr. Glennon Mac, Pre-admit testing to be scheduled, COVID testing on 05/09/19, and OR on 05/13/19. Patient is aware to quarantine after COVID testing. Patient is aware she may receive calls from the [place] and [facility]. Patient confirmed she is Self Pay, and was given the phone# to [facility].

## 2019-04-02 ENCOUNTER — Telehealth: Payer: Self-pay

## 2019-04-02 NOTE — Telephone Encounter (Signed)
Left message Confirmed patient ultrasound 04/04/19. Stephanie Larson

## 2019-04-03 ENCOUNTER — Telehealth: Payer: Self-pay

## 2019-04-03 NOTE — Telephone Encounter (Signed)
Confirmed appointment with patient. klh °

## 2019-04-04 ENCOUNTER — Ambulatory Visit: Payer: Self-pay

## 2019-04-04 ENCOUNTER — Other Ambulatory Visit: Payer: Self-pay

## 2019-04-04 DIAGNOSIS — E069 Thyroiditis, unspecified: Secondary | ICD-10-CM

## 2019-04-08 ENCOUNTER — Encounter: Payer: Self-pay | Admitting: Nurse Practitioner

## 2019-04-08 ENCOUNTER — Ambulatory Visit: Payer: Self-pay | Admitting: Nurse Practitioner

## 2019-04-08 ENCOUNTER — Other Ambulatory Visit: Payer: Self-pay

## 2019-04-08 VITALS — Resp 16 | Ht 66.0 in | Wt 222.0 lb

## 2019-04-08 DIAGNOSIS — E039 Hypothyroidism, unspecified: Secondary | ICD-10-CM

## 2019-04-08 DIAGNOSIS — M545 Low back pain: Secondary | ICD-10-CM

## 2019-04-08 DIAGNOSIS — G8929 Other chronic pain: Secondary | ICD-10-CM

## 2019-04-08 DIAGNOSIS — E041 Nontoxic single thyroid nodule: Secondary | ICD-10-CM

## 2019-04-08 MED ORDER — TIZANIDINE HCL 4 MG PO TABS: ORAL_TABLET | ORAL | 1 refills | Status: DC

## 2019-04-08 NOTE — Progress Notes (Signed)
[facility] [place], [place] [address]  Internal MEDICINE  Telephone Visit  Patient Name: Stephanie Larson  [id number]  [id number]  Date of Service: 04/08/2019  I connected with the patient at 8:56am by telephone and verified the patients identity using two identifiers.   I discussed the limitations, risks, security and privacy concerns of performing an evaluation and management service by telephone and the availability of in person appointments. I also discussed with the patient that there may be a patient responsible charge related to the service.  The patient expressed understanding and agrees to proceed.    Chief Complaint  Patient presents with  . Telephone Assessment  . Telephone Screen  . Depression    The patient has been contacted via telephone for follow up visit due to concerns for spread of novel coronavirus. She presents today for follow up of thyroid ultrasound. Her thyroid is enlarged. There is single nodule noted on the right lobe of the thyroid. It measures 6X6X35mm in diameter. There is also reduced blood flow to the thyroid gland itself. She is significantly hypothyroid, currently on levothyroxine 244mcg daily.  Her last thyroid panel showed low TSH but normal t3 and free t4.        Current Medication: Outpatient Encounter Medications as of 04/08/2019  Medication Sig  . doxycycline (VIBRA-TABS) 100 MG tablet Take 1 tablet (100 mg total) by mouth 2 (two) times daily.  Marland Kitchen gabapentin (NEURONTIN) 300 MG capsule Take 1 capsule (300 mg total) by mouth 2 (two) times daily.  . hydrOXYzine (ATARAX/VISTARIL) 50 MG tablet Take 1 tablet (50 mg total) by mouth 3 (three) times daily as needed.  Marland Kitchen levothyroxine (SYNTHROID) 200 MCG tablet Take 1 tablet (200 mcg total) by mouth daily before breakfast.  . medroxyPROGESTERone (PROVERA) 10 MG tablet Take 1 tablet (10 mg total) by mouth daily.  Marland Kitchen rOPINIRole (REQUIP) 1 MG tablet Take 1 tablet (1 mg total) by mouth at  bedtime.  Marland Kitchen tiZANidine (ZANAFLEX) 4 MG tablet Take 1/2 to 1 tablet po BID prn muscle pain/spasms.  Marland Kitchen zolpidem (AMBIEN CR) 12.5 MG CR tablet Take 1 tablet (12.5 mg total) by mouth at bedtime as needed for sleep.  . [DISCONTINUED] tiZANidine (ZANAFLEX) 4 MG tablet Take 1/2 to 1 tablet po BID prn muscle pain/spasms.  . fluconazole (DIFLUCAN) 150 MG tablet Take 1 tablet po once. May repeat dose in 3 days as needed for persistent symptoms. (Patient not taking: Reported on 04/08/2019)   No facility-administered encounter medications on file as of 04/08/2019.     Surgical History: Past Surgical History:  Procedure Laterality Date  . LAPAROSCOPIC GASTRIC BANDING    . TUBAL LIGATION      Medical History: Past Medical History:  Diagnosis Date  . Anxiety   . Depression   . Family history of ovarian cancer    qualifies for cancer genetic testing  . Insomnia   . Memory changes   . Sinus drainage   . Sleep apnea   . Thyroid disease     Family History: Family History  Problem Relation Age of Onset  . Colon cancer Paternal Grandfather   . Ovarian cancer Paternal Grandmother 6  . Hypertension Maternal Grandmother   . Hypertension Father   . Emphysema Father   . ALS Mother   . Hypertension Mother     Social History   Socioeconomic History  . Marital status: Married    Spouse name: Not on file  . Number of children:  Not on file  . Years of education: Not on file  . Highest education level: Not on file  Occupational History  . Not on file  Social Needs  . Financial resource strain: Not on file  . Food insecurity    Worry: Not on file    Inability: Not on file  . Transportation needs    Medical: Not on file    Non-medical: Not on file  Tobacco Use  . Smoking status: Current Every Day Smoker    Packs/day: 0.50    Types: Cigarettes  . Smokeless tobacco: Never Used  Substance and Sexual Activity  . Alcohol use: Not Currently  . Drug use: Never  . Sexual activity: Yes     Birth control/protection: None  Lifestyle  . Physical activity    Days per week: Not on file    Minutes per session: Not on file  . Stress: Not on file  Relationships  . Social Herbalist on phone: Not on file    Gets together: Not on file    Attends religious service: Not on file    Active member of club or organization: Not on file    Attends meetings of clubs or organizations: Not on file    Relationship status: Not on file  . Intimate partner violence    Fear of current or ex partner: Not on file    Emotionally abused: Not on file    Physically abused: Not on file    Forced sexual activity: Not on file  Other Topics Concern  . Not on file  Social History Narrative  . Not on file      Review of Systems  Constitutional: Positive for fatigue. Negative for chills and diaphoresis.  HENT: Negative for ear pain, postnasal drip and sinus pressure.   Respiratory: Negative for cough, shortness of breath and wheezing.   Cardiovascular: Negative for chest pain and palpitations.  Gastrointestinal: Negative for abdominal pain, constipation, diarrhea, nausea and vomiting.  Endocrine: Negative for cold intolerance, heat intolerance, polydipsia and polyuria.       Well managed thyroid disease  Genitourinary: Positive for menstrual problem. Negative for dysuria, frequency, hematuria and urgency.       Periods are heavy and long, lasting for several weeks. Having severe cramping with every period.  Musculoskeletal: Positive for arthralgias, back pain and myalgias. Negative for gait problem and neck pain.  Skin: Negative for color change.  Allergic/Immunologic: Negative for environmental allergies and food allergies.  Neurological: Positive for headaches. Negative for dizziness.  Hematological: Does not bruise/bleed easily.  Psychiatric/Behavioral: Positive for dysphoric mood and sleep disturbance. Negative for agitation, behavioral problems (depression) and hallucinations. The  patient is nervous/anxious.        Still having problems with sleep despite changing fro Ambien CR to lunesta 3mg  at bedtime as needed    Today's Vitals   04/08/19 0851  Resp: 16  Weight: 222 lb (100.7 kg)  Height: 5\' 6"  (1.676 m)   Body mass index is 35.83 kg/m.  Observation/Objective:   The patient is alert and oriented. She is pleasant and answers all questions appropriately. Breathing is non-labored. She is in no acute distress at this time.    Assessment/Plan:  1. Right thyroid nodule Reviewed thyroid ultrasound with the patient.  Her thyroid is enlarged. There is single nodule noted on the right lobe of the thyroid. It measures 6X6X108mm in diameter. There is also reduced blood flow to the thyroid gland itself. Will  refer to endocrinology for further evaluation.  - Ambulatory referral to Endocrinology  2. Hypothyroidism, unspecified type Currently on levothyroxine 290mcg daily. Continues to have symptoms which are consistent with severe hypothyroid. Refer to endocrinology for further evaluation.  - Ambulatory referral to Endocrinology  3. Chronic midline low back pain without sciatica May take tizanidine 4mg  tablet twice daily as needed for muscle pain/spasms.  - tiZANidine (ZANAFLEX) 4 MG tablet; Take 1/2 to 1 tablet po BID prn muscle pain/spasms.  Dispense: 60 tablet; Refill: 1  General Counseling: Ruhama verbalizes understanding of the findings of today's phone visit and agrees with plan of treatment. I have discussed any further diagnostic evaluation that may be needed or ordered today. We also reviewed her medications today. she has been encouraged to call the office with any questions or concerns that should arise related to todays visit.  This patient was seen by Leretha Pol FNP Collaboration with Dr Lavera Guise as a part of collaborative care agreement  Orders Placed This Encounter  Procedures  . Ambulatory referral to Endocrinology    Meds ordered this  encounter  Medications  . tiZANidine (ZANAFLEX) 4 MG tablet    Sig: Take 1/2 to 1 tablet po BID prn muscle pain/spasms.    Dispense:  60 tablet    Refill:  1    Order Specific Question:   Supervising Provider    Answer:   Lavera Guise [id number]    Time spent: 65 Minutes    Dr Lavera Guise Internal medicine

## 2019-04-14 NOTE — Progress Notes (Signed)
Discussed at her last visit. Referred to endocrinology for further evaluation due to high requirement for levothyroxine.

## 2019-05-05 ENCOUNTER — Encounter: Payer: Self-pay | Admitting: Obstetrics and Gynecology

## 2019-05-05 ENCOUNTER — Ambulatory Visit (INDEPENDENT_AMBULATORY_CARE_PROVIDER_SITE_OTHER): Payer: Self-pay | Admitting: Obstetrics and Gynecology

## 2019-05-05 ENCOUNTER — Other Ambulatory Visit: Payer: Self-pay

## 2019-05-05 VITALS — BP 140/90 | Ht 66.0 in | Wt 221.0 lb

## 2019-05-05 DIAGNOSIS — N921 Excessive and frequent menstruation with irregular cycle: Secondary | ICD-10-CM

## 2019-05-05 DIAGNOSIS — R1030 Lower abdominal pain, unspecified: Secondary | ICD-10-CM

## 2019-05-05 DIAGNOSIS — D252 Subserosal leiomyoma of uterus: Secondary | ICD-10-CM

## 2019-05-05 NOTE — Progress Notes (Signed)
Preoperative History and Physical  Stephanie Larson is a 49 y.o. G3P3 here for surgical management of menorrhagia with irregular cycle.   No significant preoperative concerns.  History of Present Illness: 49 y.o. G3P3 female who presents for preoperative evaluation of the above. She has had a negative endometrial biopsy and normal pap smear. She had a pelvic ultrasound in 01/2019 that showed an exophytic likely fibroid that measured about 4 cm.  She had a recent pelvic ultrasound that showed an exophytic fibroid measuring 4.2 x 3.8 x 4 cm.   She states that she has been having heavy periods this whole year.  Prior to this year she had a period that came monthly, lasting 4-5 days.  The periods were only heavy the first 2 days. They were painful.  In the past year her periods are nearly constant.  They are painful, worse than before.  She passing clots with her bleeding.  There have been times where she would wear a pad and tampon and she had to change them every 45 minutes.  This continues up until today.  She may go 1-1.5 weeks without bleeding.   She has pain in her suprapubic that she describes as dull.  When she has her period the pain is from the front to the back.  Alleviating factors: none.  Aggravating factors: none.  Associated symptoms:  None.  She rates her pain an 8/10.  The pain has been present for a while.  It has gotten worse over time.  The pain has really been bad the past two years.   Proposed surgery: Total Laparoscopic Hysterectomy, bilateral salpingectomy, cystoscopy  Past Medical History:  Diagnosis Date  . Anxiety   . Depression   . Family history of ovarian cancer    qualifies for cancer genetic testing  . Insomnia   . Memory changes   . Sinus drainage   . Sleep apnea   . Thyroid disease    Past Surgical History:  Procedure Laterality Date  . LAPAROSCOPIC GASTRIC BANDING    . TUBAL LIGATION     OB History  Gravida Para Term Preterm AB Living  3 3       3   SAB TAB  Ectopic Multiple Live Births               # Outcome Date GA Lbr Len/2nd Weight Sex Delivery Anes PTL Lv  3 Para           2 Para           1 Para           Patient denies any other pertinent gynecologic issues.   Current Outpatient Medications on File Prior to Visit  Medication Sig Dispense Refill  . fluconazole (DIFLUCAN) 150 MG tablet Take 1 tablet po once. May repeat dose in 3 days as needed for persistent symptoms. 3 tablet 1  . gabapentin (NEURONTIN) 300 MG capsule Take 1 capsule (300 mg total) by mouth 2 (two) times daily. 60 capsule 3  . hydrOXYzine (ATARAX/VISTARIL) 50 MG tablet Take 1 tablet (50 mg total) by mouth 3 (three) times daily as needed. 90 tablet 3  . ibuprofen (ADVIL) 200 MG tablet Take 600 mg by mouth every 6 (six) hours as needed for moderate pain.    Marland Kitchen levothyroxine (SYNTHROID) 200 MCG tablet Take 1 tablet (200 mcg total) by mouth daily before breakfast. 30 tablet 6  . medroxyPROGESTERone (PROVERA) 10 MG tablet Take 1 tablet (10 mg total) by  mouth daily. 84 tablet 0  . mometasone (ELOCON) 0.1 % cream APPLY TO AFFECTED AREAS FROM FACE TO LOWER EXTREMITIES TWICE A DAY TO SPOT TREAT DURING FLARES    . rOPINIRole (REQUIP) 1 MG tablet Take 1 tablet (1 mg total) by mouth at bedtime. 30 tablet 3  . tiZANidine (ZANAFLEX) 4 MG tablet Take 1/2 to 1 tablet po BID prn muscle pain/spasms. (Patient taking differently: Take 2-4 mg by mouth 2 (two) times daily as needed for muscle spasms. ) 60 tablet 1  . zolpidem (AMBIEN CR) 12.5 MG CR tablet Take 1 tablet (12.5 mg total) by mouth at bedtime as needed for sleep. 30 tablet 3   No current facility-administered medications on file prior to visit.   Allergies  Allergen Reactions  . Seroquel [Quetiapine Fumarate] Other (See Comments)    nightmares    Social History:   reports that she has been smoking cigarettes. She has been smoking about 0.50 packs per day. She has never used smokeless tobacco. She reports previous alcohol use.  She reports that she does not use drugs.  Family History  Problem Relation Age of Onset  . Colon cancer Paternal Grandfather   . Ovarian cancer Paternal Grandmother 37  . Hypertension Maternal Grandmother   . Hypertension Father   . Emphysema Father   . ALS Mother   . Hypertension Mother     Review of Systems: Noncontributory  PHYSICAL EXAM: Blood pressure 140/90, height 5\' 6"  (1.676 m), weight 221 lb (100.2 kg), last menstrual period 04/18/2019. CONSTITUTIONAL: Well-developed, well-nourished female in no acute distress.  HENT:  Normocephalic, atraumatic, External right and left ear normal. Oropharynx is clear and moist EYES: Conjunctivae and EOM are normal. Pupils are equal, round, and reactive to light. No scleral icterus.  NECK: Normal range of motion, supple, no masses SKIN: Skin is warm and dry. No rash noted. Not diaphoretic. No erythema. No pallor. [place]: Alert and oriented to person, place, and time. Normal reflexes, muscle tone coordination. No cranial nerve deficit noted. PSYCHIATRIC: Normal mood and affect. Normal behavior. Normal judgment and thought content. CARDIOVASCULAR: Normal heart rate noted, regular rhythm RESPIRATORY: Effort and breath sounds normal, no problems with respiration noted ABDOMEN: Soft, nontender, nondistended. PELVIC: Deferred MUSCULOSKELETAL: Normal range of motion. No edema and no tenderness. 2+ distal pulses.  Labs: No results found for this or any previous visit (from the past 336 hour(s)).  Imaging Studies: No results found.  Assessment:   ICD-10-CM   1. Lower abdominal pain  R10.30   2. Menorrhagia with irregular cycle  N92.1   3. 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   The risks of surgery were discussed in detail with the patient including but not limited to: bleeding which may require transfusion or reoperation; infection which may require antibiotics;  injury to surrounding organs which may involve bowel, bladder, ureters ; need for additional procedures including laparoscopy or laparotomy; thromboembolic phenomenon, surgical site problems and other postoperative/anesthesia complications. Likelihood of success in alleviating the patient's condition was discussed. Routine postoperative instructions will be reviewed with the patient and her family in detail after surgery.  The patient concurred with the proposed plan, giving informed written consent for the surgery.  Preoperative prophylactic antibiotics, as indicated, and SCDs ordered on call to the OR.    Prentice Docker, MD 05/05/2019 5:08 PM

## 2019-05-05 NOTE — H&P (View-Only) (Signed)
Preoperative History and Physical  Stephanie Larson is a 49 y.o. G3P3 here for surgical management of menorrhagia with irregular cycle.   No significant preoperative concerns.  History of Present Illness: 49 y.o. G3P3 female who presents for preoperative evaluation of the above. She has had a negative endometrial biopsy and normal pap smear. She had a pelvic ultrasound in 01/2019 that showed an exophytic likely fibroid that measured about 4 cm.  She had a recent pelvic ultrasound that showed an exophytic fibroid measuring 4.2 x 3.8 x 4 cm.   She states that she has been having heavy periods this whole year.  Prior to this year she had a period that came monthly, lasting 4-5 days.  The periods were only heavy the first 2 days. They were painful.  In the past year her periods are nearly constant.  They are painful, worse than before.  She passing clots with her bleeding.  There have been times where she would wear a pad and tampon and she had to change them every 45 minutes.  This continues up until today.  She may go 1-1.5 weeks without bleeding.   She has pain in her suprapubic that she describes as dull.  When she has her period the pain is from the front to the back.  Alleviating factors: none.  Aggravating factors: none.  Associated symptoms:  None.  She rates her pain an 8/10.  The pain has been present for a while.  It has gotten worse over time.  The pain has really been bad the past two years.   Proposed surgery: Total Laparoscopic Hysterectomy, bilateral salpingectomy, cystoscopy  Past Medical History:  Diagnosis Date  . Anxiety   . Depression   . Family history of ovarian cancer    qualifies for cancer genetic testing  . Insomnia   . Memory changes   . Sinus drainage   . Sleep apnea   . Thyroid disease    Past Surgical History:  Procedure Laterality Date  . LAPAROSCOPIC GASTRIC BANDING    . TUBAL LIGATION     OB History  Gravida Para Term Preterm AB Living  3 3       3   SAB TAB  Ectopic Multiple Live Births               # Outcome Date GA Lbr Len/2nd Weight Sex Delivery Anes PTL Lv  3 Para           2 Para           1 Para           Patient denies any other pertinent gynecologic issues.   Current Outpatient Medications on File Prior to Visit  Medication Sig Dispense Refill  . fluconazole (DIFLUCAN) 150 MG tablet Take 1 tablet po once. May repeat dose in 3 days as needed for persistent symptoms. 3 tablet 1  . gabapentin (NEURONTIN) 300 MG capsule Take 1 capsule (300 mg total) by mouth 2 (two) times daily. 60 capsule 3  . hydrOXYzine (ATARAX/VISTARIL) 50 MG tablet Take 1 tablet (50 mg total) by mouth 3 (three) times daily as needed. 90 tablet 3  . ibuprofen (ADVIL) 200 MG tablet Take 600 mg by mouth every 6 (six) hours as needed for moderate pain.    Marland Kitchen levothyroxine (SYNTHROID) 200 MCG tablet Take 1 tablet (200 mcg total) by mouth daily before breakfast. 30 tablet 6  . medroxyPROGESTERone (PROVERA) 10 MG tablet Take 1 tablet (10 mg total) by  mouth daily. 84 tablet 0  . mometasone (ELOCON) 0.1 % cream APPLY TO AFFECTED AREAS FROM FACE TO LOWER EXTREMITIES TWICE A DAY TO SPOT TREAT DURING FLARES    . rOPINIRole (REQUIP) 1 MG tablet Take 1 tablet (1 mg total) by mouth at bedtime. 30 tablet 3  . tiZANidine (ZANAFLEX) 4 MG tablet Take 1/2 to 1 tablet po BID prn muscle pain/spasms. (Patient taking differently: Take 2-4 mg by mouth 2 (two) times daily as needed for muscle spasms. ) 60 tablet 1  . zolpidem (AMBIEN CR) 12.5 MG CR tablet Take 1 tablet (12.5 mg total) by mouth at bedtime as needed for sleep. 30 tablet 3   No current facility-administered medications on file prior to visit.   Allergies  Allergen Reactions  . Seroquel [Quetiapine Fumarate] Other (See Comments)    nightmares    Social History:   reports that she has been smoking cigarettes. She has been smoking about 0.50 packs per day. She has never used smokeless tobacco. She reports previous alcohol use.  She reports that she does not use drugs.  Family History  Problem Relation Age of Onset  . Colon cancer Paternal Grandfather   . Ovarian cancer Paternal Grandmother 63  . Hypertension Maternal Grandmother   . Hypertension Father   . Emphysema Father   . ALS Mother   . Hypertension Mother     Review of Systems: Noncontributory  PHYSICAL EXAM: Blood pressure 140/90, height 5\' 6"  (1.676 m), weight 221 lb (100.2 kg), last menstrual period 04/18/2019. CONSTITUTIONAL: Well-developed, well-nourished female in no acute distress.  HENT:  Normocephalic, atraumatic, External right and left ear normal. Oropharynx is clear and moist EYES: Conjunctivae and EOM are normal. Pupils are equal, round, and reactive to light. No scleral icterus.  NECK: Normal range of motion, supple, no masses SKIN: Skin is warm and dry. No rash noted. Not diaphoretic. No erythema. No pallor. [place]: Alert and oriented to person, place, and time. Normal reflexes, muscle tone coordination. No cranial nerve deficit noted. PSYCHIATRIC: Normal mood and affect. Normal behavior. Normal judgment and thought content. CARDIOVASCULAR: Normal heart rate noted, regular rhythm RESPIRATORY: Effort and breath sounds normal, no problems with respiration noted ABDOMEN: Soft, nontender, nondistended. PELVIC: Deferred MUSCULOSKELETAL: Normal range of motion. No edema and no tenderness. 2+ distal pulses.  Labs: No results found for this or any previous visit (from the past 336 hour(s)).  Imaging Studies: No results found.  Assessment:   ICD-10-CM   1. Lower abdominal pain  R10.30   2. Menorrhagia with irregular cycle  N92.1   3. 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   The risks of surgery were discussed in detail with the patient including but not limited to: bleeding which may require transfusion or reoperation; infection which may require antibiotics;  injury to surrounding organs which may involve bowel, bladder, ureters ; need for additional procedures including laparoscopy or laparotomy; thromboembolic phenomenon, surgical site problems and other postoperative/anesthesia complications. Likelihood of success in alleviating the patient's condition was discussed. Routine postoperative instructions will be reviewed with the patient and her family in detail after surgery.  The patient concurred with the proposed plan, giving informed written consent for the surgery.  Preoperative prophylactic antibiotics, as indicated, and SCDs ordered on call to the OR.    Prentice Docker, MD 05/05/2019 5:08 PM

## 2019-05-06 ENCOUNTER — Other Ambulatory Visit: Payer: Self-pay

## 2019-05-06 ENCOUNTER — Encounter
Admission: RE | Admit: 2019-05-06 | Discharge: 2019-05-06 | Disposition: A | Discharge status: Home / Self Care | Payer: Self-pay | Source: Ambulatory Visit | Attending: Obstetrics and Gynecology | Admitting: Obstetrics and Gynecology

## 2019-05-06 HISTORY — DX: Hypothyroidism, unspecified: E03.9

## 2019-05-06 NOTE — Patient Instructions (Signed)
Your procedure is scheduled on: May 13, 2019 TUESDAY Report to Day Surgery on the 2nd floor of the [facility]. To find out your arrival time, please call [phone] between 1PM - 3PM on: May 12, 2019 MONDAY  REMEMBER: Instructions that are not followed completely may result in serious medical risk, up to and including death; or upon the discretion of your surgeon and anesthesiologist your surgery may need to be rescheduled.  Do not eat food after midnight the night before surgery.  No gum chewing, lozengers or hard candies.  You may however, drink CLEAR liquids up to 2 hours before you are scheduled to arrive for your surgery. Do not drink anything within 2 hours of the start of your surgery.  Clear liquids include: - water  - apple juice without pulp -CLEAR  gatorade - black coffee or tea (Do NOT add milk or creamers to the coffee or tea) Do NOT drink anything that is not on this list.  Type 1 and Type 2 diabetics should only drink water.  ENSURE PRE-SURGERY CARBOHYDRATE DRINK:  Complete drinking 2 hours  BEFORE ARRIVING AT HOSPITAL - IF INCLUDED.  No Alcohol for 24 hours before or after surgery.  No Smoking including e-cigarettes for 24 hours prior to surgery.  No chewable tobacco products for at least 6 hours prior to surgery.  No nicotine patches on the day of surgery.  On the morning of surgery brush your teeth with toothpaste and water, you may rinse your mouth with mouthwash if you wish. Do not swallow any toothpaste or mouthwash.  Notify your doctor if there is any change in your medical condition (cold, fever, infection).  Do not wear jewelry, make-up, hairpins, clips or nail polish.  Do not wear lotions, powders, or perfumes.   Do not shave 48 hours prior to surgery.   Contacts and dentures may not be worn into surgery.  Do not bring valuables to the hospital, including drivers license, insurance or credit cards.  [facility] is not responsible for  any belongings or valuables.   TAKE THESE MEDICATIONS THE MORNING OF SURGERY: gabapentin   Use CHG Soap as directed on instruction sheet.  Stop Anti-inflammatories (NSAIDS) such as Advil, Aleve, Ibuprofen, Motrin, Naproxen, Naprosyn and aspirin or  Aspirin based products such as Excedrin, Goodys Powder, BC Powder. (May take Tylenol or Acetaminophen if needed.)  Stop ANY OVER THE COUNTER supplements until after surgery.  Wear comfortable clothing (specific to your surgery type) to the hospital.  Plan for stool softeners for home use.  If you are being discharged the day of surgery, you will not be allowed to drive home. You will need a responsible adult to drive you home and stay with you that night.   If you are taking public transportation, you will need to have a responsible adult with you. Please confirm with your physician that it is acceptable to use public transportation.   Please call [phone] if you have any questions about these instructions.

## 2019-05-09 ENCOUNTER — Other Ambulatory Visit
Admission: RE | Admit: 2019-05-09 | Discharge: 2019-05-09 | Disposition: A | Discharge status: Home / Self Care | Payer: Self-pay | Source: Ambulatory Visit | Attending: Obstetrics and Gynecology | Admitting: Obstetrics and Gynecology

## 2019-05-09 ENCOUNTER — Other Ambulatory Visit: Payer: Self-pay

## 2019-05-09 DIAGNOSIS — Z01812 Encounter for preprocedural laboratory examination: Secondary | ICD-10-CM | POA: Insufficient documentation

## 2019-05-09 DIAGNOSIS — Z20822 Contact with and (suspected) exposure to covid-19: Secondary | ICD-10-CM | POA: Insufficient documentation

## 2019-05-09 LAB — COMPREHENSIVE METABOLIC PANEL
ALT: 24 U/L (ref 0–44)
AST: 19 U/L (ref 15–41)
Albumin: 3.7 g/dL (ref 3.5–5.0)
Alkaline Phosphatase: 66 U/L (ref 38–126)
Anion gap: 9 (ref 5–15)
BUN: 14 mg/dL (ref 6–20)
CO2: 26 mmol/L (ref 22–32)
Calcium: 8.7 mg/dL — ABNORMAL LOW (ref 8.9–10.3)
Chloride: 107 mmol/L (ref 98–111)
Creatinine, Ser: 0.97 mg/dL (ref 0.44–1.00)
GFR calc Af Amer: >60 mL/min (ref >60)
GFR calc non Af Amer: >60 mL/min (ref >60)
Glucose, Bld: 110 mg/dL — ABNORMAL HIGH (ref 70–99)
Potassium: 3 mmol/L — ABNORMAL LOW (ref 3.5–5.1)
Sodium: 142 mmol/L (ref 135–145)
Total Bilirubin: 0.4 mg/dL (ref 0.3–1.2)
Total Protein: 7 g/dL (ref 6.5–8.1)

## 2019-05-09 LAB — CBC
HCT: 39.6 % (ref 36.0–46.0)
Hemoglobin: 13 g/dL (ref 12.0–15.0)
MCH: 27.8 pg (ref 26.0–34.0)
MCHC: 32.8 g/dL (ref 30.0–36.0)
MCV: 84.6 fL (ref 80.0–100.0)
Platelets: 554 10*3/uL — ABNORMAL HIGH (ref 150–400)
RBC: 4.68 MIL/uL (ref 3.87–5.11)
RDW: 13.1 % (ref 11.5–15.5)
WBC: 13.7 10*3/uL — ABNORMAL HIGH (ref 4.0–10.5)
nRBC: 0 % (ref 0.0–0.2)

## 2019-05-09 LAB — TYPE AND SCREEN
ABO/RH(D): A POS
Antibody Screen: NEGATIVE

## 2019-05-09 LAB — SARS CORONAVIRUS 2 (TAT 6-24 HRS): SARS Coronavirus 2: NEGATIVE

## 2019-05-09 NOTE — Pre-Procedure Instructions (Signed)
Dr Glennon Mac notified via secure chat pt's K+ level 3.0. Will recheck DOS.

## 2019-05-10 ENCOUNTER — Other Ambulatory Visit: Payer: Self-pay | Admitting: Obstetrics and Gynecology

## 2019-05-10 DIAGNOSIS — E876 Hypokalemia: Secondary | ICD-10-CM

## 2019-05-10 MED ORDER — POTASSIUM CHLORIDE CRYS ER 20 MEQ PO TBCR: 40.0000 meq | EXTENDED_RELEASE_TABLET | Freq: Two times a day (BID) | ORAL | 0 refills | Status: DC

## 2019-05-12 MED ORDER — CEFAZOLIN SODIUM-DEXTROSE 2-4 GM/100ML-% IV SOLN
2.0000 g | INTRAVENOUS | Status: AC
  Administered 2019-05-13: 2 g via INTRAVENOUS

## 2019-05-13 ENCOUNTER — Ambulatory Visit
Admission: RE | Admit: 2019-05-13 | Discharge: 2019-05-13 | Disposition: A | Discharge status: Home / Self Care | Payer: Self-pay | Attending: Obstetrics and Gynecology | Admitting: Obstetrics and Gynecology

## 2019-05-13 ENCOUNTER — Ambulatory Visit: Payer: Self-pay | Admitting: Anesthesiology

## 2019-05-13 ENCOUNTER — Encounter: Payer: Self-pay | Admitting: Obstetrics and Gynecology

## 2019-05-13 ENCOUNTER — Encounter
Admission: RE | Disposition: A | Discharge status: Home / Self Care | Payer: Self-pay | Source: Home / Self Care | Attending: Obstetrics and Gynecology

## 2019-05-13 DIAGNOSIS — Z6835 Body mass index (BMI) 35.0-35.9, adult: Secondary | ICD-10-CM | POA: Insufficient documentation

## 2019-05-13 DIAGNOSIS — N920 Excessive and frequent menstruation with regular cycle: Secondary | ICD-10-CM

## 2019-05-13 DIAGNOSIS — D252 Subserosal leiomyoma of uterus: Secondary | ICD-10-CM | POA: Insufficient documentation

## 2019-05-13 DIAGNOSIS — D259 Leiomyoma of uterus, unspecified: Secondary | ICD-10-CM | POA: Diagnosis present

## 2019-05-13 DIAGNOSIS — Z79890 Hormone replacement therapy (postmenopausal): Secondary | ICD-10-CM | POA: Insufficient documentation

## 2019-05-13 DIAGNOSIS — R1030 Lower abdominal pain, unspecified: Secondary | ICD-10-CM | POA: Diagnosis present

## 2019-05-13 DIAGNOSIS — N838 Other noninflammatory disorders of ovary, fallopian tube and broad ligament: Secondary | ICD-10-CM | POA: Insufficient documentation

## 2019-05-13 DIAGNOSIS — M792 Neuralgia and neuritis, unspecified: Secondary | ICD-10-CM

## 2019-05-13 DIAGNOSIS — F411 Generalized anxiety disorder: Secondary | ICD-10-CM

## 2019-05-13 DIAGNOSIS — G2581 Restless legs syndrome: Secondary | ICD-10-CM

## 2019-05-13 DIAGNOSIS — G4730 Sleep apnea, unspecified: Secondary | ICD-10-CM | POA: Insufficient documentation

## 2019-05-13 DIAGNOSIS — G4700 Insomnia, unspecified: Secondary | ICD-10-CM | POA: Insufficient documentation

## 2019-05-13 DIAGNOSIS — F409 Phobic anxiety disorder, unspecified: Secondary | ICD-10-CM

## 2019-05-13 DIAGNOSIS — Z79899 Other long term (current) drug therapy: Secondary | ICD-10-CM | POA: Insufficient documentation

## 2019-05-13 DIAGNOSIS — F17210 Nicotine dependence, cigarettes, uncomplicated: Secondary | ICD-10-CM | POA: Insufficient documentation

## 2019-05-13 DIAGNOSIS — E039 Hypothyroidism, unspecified: Secondary | ICD-10-CM | POA: Insufficient documentation

## 2019-05-13 DIAGNOSIS — Z9884 Bariatric surgery status: Secondary | ICD-10-CM | POA: Insufficient documentation

## 2019-05-13 DIAGNOSIS — G8929 Other chronic pain: Secondary | ICD-10-CM

## 2019-05-13 DIAGNOSIS — Z8041 Family history of malignant neoplasm of ovary: Secondary | ICD-10-CM | POA: Insufficient documentation

## 2019-05-13 HISTORY — PX: CYSTOSCOPY: SHX5120

## 2019-05-13 HISTORY — PX: TOTAL LAPAROSCOPIC HYSTERECTOMY WITH SALPINGECTOMY: SHX6742

## 2019-05-13 LAB — ABO/RH: ABO/RH(D): A POS

## 2019-05-13 LAB — POCT I-STAT, CHEM 8
BUN: 8 mg/dL (ref 6–20)
Calcium, Ion: 1.12 mmol/L — ABNORMAL LOW (ref 1.15–1.40)
Chloride: 103 mmol/L (ref 98–111)
Creatinine, Ser: 0.8 mg/dL (ref 0.44–1.00)
Glucose, Bld: 154 mg/dL — ABNORMAL HIGH (ref 70–99)
HCT: 40 % (ref 36.0–46.0)
Hemoglobin: 13.6 g/dL (ref 12.0–15.0)
Potassium: 3.1 mmol/L — ABNORMAL LOW (ref 3.5–5.1)
Sodium: 139 mmol/L (ref 135–145)
TCO2: 26 mmol/L (ref 22–32)

## 2019-05-13 LAB — POCT PREGNANCY, URINE: Preg Test, Ur: NEGATIVE

## 2019-05-13 SURGERY — HYSTERECTOMY, TOTAL, LAPAROSCOPIC, WITH SALPINGECTOMY
Anesthesia: General

## 2019-05-13 MED ORDER — OXYCODONE HCL 5 MG PO TABS
5.0000 mg | ORAL_TABLET | Freq: Once | ORAL | Status: AC
  Administered 2019-05-13: 5 mg via ORAL
  Filled 2019-05-13: qty 1

## 2019-05-13 MED ORDER — GABAPENTIN 300 MG PO CAPS: 300.0000 mg | ORAL_CAPSULE | Freq: Two times a day (BID) | ORAL | Status: DC

## 2019-05-13 MED ORDER — OXYCODONE HCL 5 MG PO TABS
ORAL_TABLET | ORAL | Status: AC
  Filled 2019-05-13: qty 1

## 2019-05-13 MED ORDER — SUGAMMADEX SODIUM 200 MG/2ML IV SOLN
INTRAVENOUS | Status: DC | PRN
  Administered 2019-05-13: 200 mg via INTRAVENOUS

## 2019-05-13 MED ORDER — LIDOCAINE HCL (CARDIAC) PF 100 MG/5ML IV SOSY
PREFILLED_SYRINGE | INTRAVENOUS | Status: DC | PRN
  Administered 2019-05-13: 60 mg via INTRAVENOUS

## 2019-05-13 MED ORDER — FENTANYL CITRATE (PF) 100 MCG/2ML IJ SOLN
INTRAMUSCULAR | Status: DC | PRN
  Administered 2019-05-13: 50 ug via INTRAVENOUS
  Administered 2019-05-13 (×2): 100 ug via INTRAVENOUS

## 2019-05-13 MED ORDER — FENTANYL CITRATE (PF) 250 MCG/5ML IJ SOLN
INTRAMUSCULAR | Status: AC
  Filled 2019-05-13: qty 5

## 2019-05-13 MED ORDER — CEFAZOLIN SODIUM-DEXTROSE 2-4 GM/100ML-% IV SOLN
INTRAVENOUS | Status: AC
  Filled 2019-05-13: qty 100

## 2019-05-13 MED ORDER — LEVOTHYROXINE SODIUM 200 MCG PO TABS: 200.0000 ug | ORAL_TABLET | Freq: Every day | ORAL | Status: DC

## 2019-05-13 MED ORDER — ZOLPIDEM TARTRATE ER 12.5 MG PO TBCR: 12.5000 mg | EXTENDED_RELEASE_TABLET | Freq: Every evening | ORAL | Status: DC | PRN

## 2019-05-13 MED ORDER — BUPIVACAINE HCL (PF) 0.5 % IJ SOLN
INTRAMUSCULAR | Status: AC
  Filled 2019-05-13: qty 30

## 2019-05-13 MED ORDER — PROPOFOL 10 MG/ML IV BOLUS
INTRAVENOUS | Status: DC | PRN
  Administered 2019-05-13: 180 mg via INTRAVENOUS

## 2019-05-13 MED ORDER — DEXAMETHASONE SODIUM PHOSPHATE 10 MG/ML IJ SOLN
INTRAMUSCULAR | Status: DC | PRN
  Administered 2019-05-13: 10 mg via INTRAVENOUS

## 2019-05-13 MED ORDER — FAMOTIDINE 20 MG PO TABS
ORAL_TABLET | ORAL | Status: AC
  Administered 2019-05-13: 20 mg via ORAL
  Filled 2019-05-13: qty 1

## 2019-05-13 MED ORDER — FENTANYL CITRATE (PF) 100 MCG/2ML IJ SOLN
INTRAMUSCULAR | Status: AC
  Administered 2019-05-13: 25 ug via INTRAVENOUS
  Filled 2019-05-13: qty 2

## 2019-05-13 MED ORDER — EPHEDRINE SULFATE 50 MG/ML IJ SOLN
INTRAMUSCULAR | Status: DC | PRN
  Administered 2019-05-13: 15 mg via INTRAVENOUS

## 2019-05-13 MED ORDER — LACTATED RINGERS IV SOLN: INTRAVENOUS | Status: DC | PRN

## 2019-05-13 MED ORDER — MIDAZOLAM HCL 2 MG/2ML IJ SOLN
INTRAMUSCULAR | Status: DC | PRN
  Administered 2019-05-13: 2 mg via INTRAVENOUS

## 2019-05-13 MED ORDER — ONDANSETRON HCL 4 MG/2ML IJ SOLN
INTRAMUSCULAR | Status: DC | PRN
  Administered 2019-05-13: 4 mg via INTRAVENOUS

## 2019-05-13 MED ORDER — FENTANYL CITRATE (PF) 100 MCG/2ML IJ SOLN
50.0000 ug | Freq: Once | INTRAMUSCULAR | Status: AC
  Administered 2019-05-13: 50 ug via INTRAVENOUS

## 2019-05-13 MED ORDER — IBUPROFEN 600 MG PO TABS: 600.0000 mg | ORAL_TABLET | Freq: Four times a day (QID) | ORAL | 0 refills | Status: DC

## 2019-05-13 MED ORDER — FAMOTIDINE 20 MG PO TABS: 20.0000 mg | ORAL_TABLET | Freq: Once | ORAL | Status: AC

## 2019-05-13 MED ORDER — ACETAMINOPHEN 10 MG/ML IV SOLN
INTRAVENOUS | Status: DC | PRN
  Administered 2019-05-13: 1000 mg via INTRAVENOUS

## 2019-05-13 MED ORDER — ROCURONIUM BROMIDE 100 MG/10ML IV SOLN
INTRAVENOUS | Status: DC | PRN
  Administered 2019-05-13: 20 mg via INTRAVENOUS
  Administered 2019-05-13: 10 mg via INTRAVENOUS
  Administered 2019-05-13: 30 mg via INTRAVENOUS

## 2019-05-13 MED ORDER — ROPINIROLE HCL 1 MG PO TABS: 1.0000 mg | ORAL_TABLET | Freq: Every day | ORAL | Status: DC

## 2019-05-13 MED ORDER — HYDROXYZINE HCL 50 MG PO TABS: 50.0000 mg | ORAL_TABLET | Freq: Three times a day (TID) | ORAL | Status: DC | PRN

## 2019-05-13 MED ORDER — ONDANSETRON HCL 4 MG/2ML IJ SOLN: 4.0000 mg | Freq: Once | INTRAMUSCULAR | Status: DC | PRN

## 2019-05-13 MED ORDER — TIZANIDINE HCL 4 MG PO TABS: 2.0000 mg | ORAL_TABLET | Freq: Two times a day (BID) | ORAL | Status: DC | PRN

## 2019-05-13 MED ORDER — LACTATED RINGERS IV SOLN: INTRAVENOUS | Status: DC

## 2019-05-13 MED ORDER — SUCCINYLCHOLINE CHLORIDE 20 MG/ML IJ SOLN
INTRAMUSCULAR | Status: DC | PRN
  Administered 2019-05-13: 120 mg via INTRAVENOUS

## 2019-05-13 MED ORDER — OXYCODONE-ACETAMINOPHEN 5-325 MG PO TABS: 1.0000 | ORAL_TABLET | ORAL | 0 refills | Status: DC | PRN

## 2019-05-13 MED ORDER — ACETAMINOPHEN NICU IV SYRINGE 10 MG/ML
INTRAVENOUS | Status: AC
  Filled 2019-05-13: qty 1

## 2019-05-13 MED ORDER — BUPIVACAINE HCL 0.5 % IJ SOLN
INTRAMUSCULAR | Status: DC | PRN
  Administered 2019-05-13: 27 mL

## 2019-05-13 MED ORDER — SEVOFLURANE IN SOLN
RESPIRATORY_TRACT | Status: AC
  Filled 2019-05-13: qty 250

## 2019-05-13 MED ORDER — PHENYLEPHRINE HCL (PRESSORS) 10 MG/ML IV SOLN
INTRAVENOUS | Status: DC | PRN
  Administered 2019-05-13: 100 ug via INTRAVENOUS
  Administered 2019-05-13: 50 ug via INTRAVENOUS

## 2019-05-13 MED ORDER — FENTANYL CITRATE (PF) 100 MCG/2ML IJ SOLN
25.0000 ug | INTRAMUSCULAR | Status: DC | PRN
  Administered 2019-05-13 (×4): 25 ug via INTRAVENOUS

## 2019-05-13 MED ORDER — MIDAZOLAM HCL 2 MG/2ML IJ SOLN
INTRAMUSCULAR | Status: AC
  Filled 2019-05-13: qty 2

## 2019-05-13 SURGICAL SUPPLY — 59 items
BAG URINE DRAIN 2000ML AR STRL (UROLOGICAL SUPPLIES) ×2 IMPLANT
BLADE SURG SZ11 CARB STEEL (BLADE) ×2 IMPLANT
CATH FOLEY 2WAY  5CC 16FR (CATHETERS) ×1
CATH URTH 16FR FL 2W BLN LF (CATHETERS) ×1 IMPLANT
CHLORAPREP W/TINT 26 (MISCELLANEOUS) ×2 IMPLANT
COVER WAND RF STERILE (DRAPES) ×2 IMPLANT
DEFOGGER SCOPE WARMER CLEARIFY (MISCELLANEOUS) ×2 IMPLANT
DERMABOND ADVANCED (GAUZE/BANDAGES/DRESSINGS) ×1
DERMABOND ADVANCED .7 DNX12 (GAUZE/BANDAGES/DRESSINGS) ×1 IMPLANT
DEVICE SUTURE ENDOST 10MM (ENDOMECHANICALS) ×2 IMPLANT
DRAPE 3/4 80X56 (DRAPES) ×2 IMPLANT
DRAPE CAMERA CLOSED 9X96 (DRAPES) ×2 IMPLANT
DRAPE LEGGINS SURG 28X43 STRL (DRAPES) ×2 IMPLANT
DRAPE UNDER BUTTOCK W/FLU (DRAPES) ×2 IMPLANT
GAUZE 4X4 16PLY RFD (DISPOSABLE) ×2 IMPLANT
GLOVE BIO SURGEON STRL SZ7 (GLOVE) ×8 IMPLANT
GLOVE BIOGEL PI IND STRL 7.5 (GLOVE) ×1 IMPLANT
GLOVE BIOGEL PI INDICATOR 7.5 (GLOVE) ×1
GLOVE INDICATOR 7.5 STRL GRN (GLOVE) ×2 IMPLANT
GLOVE SURG PROTEXIS BL SZ6.5 (GLOVE) ×8 IMPLANT
GOWN STRL REUS W/ TWL LRG LVL3 (GOWN DISPOSABLE) ×5 IMPLANT
GOWN STRL REUS W/ TWL XL LVL3 (GOWN DISPOSABLE) ×1 IMPLANT
GOWN STRL REUS W/TWL LRG LVL3 (GOWN DISPOSABLE) ×5
GOWN STRL REUS W/TWL XL LVL3 (GOWN DISPOSABLE) ×1
GRASPER SUT TROCAR 14GX15 (MISCELLANEOUS) ×2 IMPLANT
IRRIGATION STRYKERFLOW (MISCELLANEOUS) ×1 IMPLANT
IRRIGATOR STRYKERFLOW (MISCELLANEOUS) ×2
IV LACTATED RINGERS 1000ML (IV SOLUTION) ×4 IMPLANT
KIT PINK PAD W/HEAD ARE REST (MISCELLANEOUS) ×2
KIT PINK PAD W/HEAD ARM REST (MISCELLANEOUS) ×1 IMPLANT
KIT TURNOVER CYSTO (KITS) ×2 IMPLANT
LABEL OR SOLS (LABEL) ×2 IMPLANT
LIGASURE VESSEL 5MM BLUNT TIP (ELECTROSURGICAL) ×2 IMPLANT
MANIPULATOR VCARE LG CRV RETR (MISCELLANEOUS) ×2 IMPLANT
MANIPULATOR VCARE SML CRV RETR (MISCELLANEOUS) IMPLANT
MANIPULATOR VCARE STD CRV RETR (MISCELLANEOUS) IMPLANT
NEEDLE HYPO 22GX1.5 SAFETY (NEEDLE) ×2 IMPLANT
OCCLUDER COLPOPNEUMO (BALLOONS) ×2 IMPLANT
PACK LAP CHOLECYSTECTOMY (MISCELLANEOUS) ×2 IMPLANT
PAD OB MATERNITY 4.3X12.25 (PERSONAL CARE ITEMS) ×2 IMPLANT
PAD PREP 24X41 OB/GYN DISP (PERSONAL CARE ITEMS) ×2 IMPLANT
SCISSORS METZENBAUM CVD 33 (INSTRUMENTS) ×2 IMPLANT
SET CYSTO W/LG BORE CLAMP LF (SET/KITS/TRAYS/PACK) ×2 IMPLANT
SET TRI-LUMEN FLTR TB AIRSEAL (TUBING) ×2 IMPLANT
SLEEVE ENDOPATH XCEL 5M (ENDOMECHANICALS) ×2 IMPLANT
SOL PREP PVP 2OZ (MISCELLANEOUS) ×2
SOLUTION PREP PVP 2OZ (MISCELLANEOUS) ×1 IMPLANT
SURGILUBE 2OZ TUBE FLIPTOP (MISCELLANEOUS) ×2 IMPLANT
SUT ENDO VLOC 180-0-8IN (SUTURE) ×4 IMPLANT
SUT MNCRL 4-0 (SUTURE) ×1
SUT MNCRL 4-0 27XMFL (SUTURE) ×1
SUT VIC AB 0 CT1 36 (SUTURE) ×2 IMPLANT
SUTURE MNCRL 4-0 27XMF (SUTURE) ×1 IMPLANT
SYR 10ML LL (SYRINGE) ×4 IMPLANT
SYR 50ML LL SCALE MARK (SYRINGE) ×2 IMPLANT
TROCAR ENDO BLADELESS 11MM (ENDOMECHANICALS) ×2 IMPLANT
TROCAR PORT AIRSEAL 8X100 (TROCAR) ×2 IMPLANT
TROCAR XCEL NON-BLD 5MMX100MML (ENDOMECHANICALS) IMPLANT
TUBING EVAC SMOKE HEATED PNEUM (TUBING) IMPLANT

## 2019-05-13 NOTE — Discharge Instructions (Signed)

## 2019-05-13 NOTE — Anesthesia Preprocedure Evaluation (Signed)
Anesthesia Evaluation  Patient identified by MRN, date of birth, ID band Patient awake    Reviewed: Allergy & Precautions, H&P , NPO status , Patient's Chart, lab work & pertinent test results, reviewed documented beta blocker date and time   Airway Mallampati: II  TM Distance: >3 FB Neck ROM: full    Dental  (+) Teeth Intact   Pulmonary neg pulmonary ROS, sleep apnea and Continuous Positive Airway Pressure Ventilation , Current Smoker,    Pulmonary exam normal        Cardiovascular Exercise Tolerance: Good negative cardio ROS Normal cardiovascular exam Rhythm:regular Rate:Normal     Neuro/Psych PSYCHIATRIC DISORDERS Anxiety Depression  Neuromuscular disease negative neurological ROS  negative psych ROS   GI/Hepatic negative GI ROS, Neg liver ROS,   Endo/Other  negative endocrine ROSHypothyroidism Morbid obesity  Renal/GU negative Renal ROS  negative genitourinary   Musculoskeletal   Abdominal   Peds  Hematology negative hematology ROS (+)   Anesthesia Other Findings Past Medical History: No date: Anxiety No date: Depression No date: Family history of ovarian cancer     Comment:  qualifies for cancer genetic testing No date: Hypothyroidism No date: Insomnia No date: Memory changes No date: Sinus drainage No date: Sleep apnea No date: Thyroid disease Past Surgical History: No date: LAPAROSCOPIC GASTRIC BANDING No date: TUBAL LIGATION   Reproductive/Obstetrics negative OB ROS                             Anesthesia Physical Anesthesia Plan  ASA: II  Anesthesia Plan: General ETT   Post-op Pain Management:    Induction:   PONV Risk Score and Plan:   Airway Management Planned:   Additional Equipment:   Intra-op Plan:   Post-operative Plan:   Informed Consent: I have reviewed the patients History and Physical, chart, labs and discussed the procedure including the risks,  benefits and alternatives for the proposed anesthesia with the patient or authorized representative who has indicated his/her understanding and acceptance.     Dental Advisory Given  Plan Discussed with: CRNA  Anesthesia Plan Comments:         Anesthesia Quick Evaluation

## 2019-05-13 NOTE — Interval H&P Note (Signed)
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  The various methods of treatment have been discussed with the patient and family. After consideration of risks, benefits and other options for treatment, the patient has consented to  Procedure(s): TOTAL LAPAROSCOPIC HYSTERECTOMY WITH BILATERAL SALPINGECTOMY (N/A) CYSTOSCOPY (N/A) as a surgical intervention.  The patient's history has been reviewed, patient examined, no change in status, stable for surgery.  I have reviewed the patient's chart and labs.  Questions were answered to the patient's satisfaction.  Consents reviewed and patient agrees to proceed.  Prentice Docker, MD, [facility], [facility] 05/13/2019 9:59 AM

## 2019-05-13 NOTE — Anesthesia Postprocedure Evaluation (Signed)
Anesthesia Post Note  Patient: Stephanie Larson  Procedure(s) Performed: TOTAL LAPAROSCOPIC HYSTERECTOMY WITH BILATERAL SALPINGECTOMY (N/A ) CYSTOSCOPY (N/A )  Patient location during evaluation: PACU Anesthesia Type: General Level of consciousness: awake and alert and oriented Pain management: pain level controlled Vital Signs Assessment: post-procedure vital signs reviewed and stable Respiratory status: spontaneous breathing, nonlabored ventilation and respiratory function stable Cardiovascular status: blood pressure returned to baseline and stable Postop Assessment: no signs of nausea or vomiting Anesthetic complications: no     Last Vitals:  Vitals:   05/13/19 1355 05/13/19 1400  BP: 130/88   Pulse: 84 84  Resp: 19 19  Temp:    SpO2: 97% 95%    Last Pain:  Vitals:   05/13/19 1350  PainSc: 4                  Dorothee Berlin Viereck

## 2019-05-13 NOTE — Anesthesia Procedure Notes (Signed)
Procedure Name: Intubation Date/Time: 05/13/2019 10:13 AM Performed by: Justus Memory, CRNA Pre-anesthesia Checklist: Patient identified, Patient being monitored, Timeout performed, Emergency Drugs available and Suction available Patient Re-evaluated:Patient Re-evaluated prior to induction Oxygen Delivery Method: Circle system utilized Preoxygenation: Pre-oxygenation with 100% oxygen Induction Type: IV induction Ventilation: Mask ventilation without difficulty Laryngoscope Size: Mac and 3 Grade View: Grade I Tube type: Oral Tube size: 7.0 mm Number of attempts: 1 Airway Equipment and Method: Stylet Placement Confirmation: ETT inserted through vocal cords under direct vision,  positive ETCO2 and breath sounds checked- equal and bilateral Secured at: 21 cm Tube secured with: Tape Dental Injury: Teeth and Oropharynx as per pre-operative assessment

## 2019-05-13 NOTE — Transfer of Care (Signed)
Immediate Anesthesia Transfer of Care Note  Patient: Stephanie Larson  Procedure(s) Performed: TOTAL LAPAROSCOPIC HYSTERECTOMY WITH BILATERAL SALPINGECTOMY (N/A ) CYSTOSCOPY (N/A )  Patient Location: PACU  Anesthesia Type:General  Level of Consciousness: sedated  Airway & Oxygen Therapy: Patient Spontanous Breathing and Patient connected to face mask oxygen  Post-op Assessment: Report given to RN and Post -op Vital signs reviewed and stable  Post vital signs: Reviewed and stable  Last Vitals:  Vitals Value Taken Time  BP 157/98 05/13/19 1256  Temp    Pulse 89 05/13/19 1302  Resp 20 05/13/19 1302  SpO2 97 % 05/13/19 1302  Vitals shown include unvalidated device data.  Last Pain: There were no vitals filed for this visit.       Complications: No apparent anesthesia complications

## 2019-05-13 NOTE — Op Note (Signed)
Operative Note    Pre-Operative Diagnosis:  1) Menorrhagia with regular cycle [N92.0] 2) Abdominal pain [R10.30] 3) Fibroid uterus [D25.9]  Post-Operative Diagnosis:  1) Menorrhagia with regular cycle [N92.0] 2) Abdominal pain [R10.30] 3) Fibroid uterus [D25.9]  Procedures:  1. Total Laparoscopic Hysterectomy, bilateral salpingectomy 2. Cystoscopy  Primary Surgeon: Prentice Docker, MD   Assistant Surgeon: Adrian Prows, MD - No other capable assistant available, in surgery requiring high level assistant.  EBL: 100 mL   IVF: 1,000 mL crystalloid  Urine output: 300 mL clear urine at end of case  Specimens: Uterus with cervix, bilateral fallopian tubes, fibroid  Drains: none  Complications: None   Disposition: PACU   Condition: Stable   Findings:  1) globular uterus 2) right lower uterine segment subserosal fibroid, approximately 4 cm 3) normal appearing ovaries and fallopian tubes with changes consistent with bilateral tubal ligation 4) On cystoscopy, bladder wall without injury or defects, efflux of urine from the bilateral ureteral orifices  Procedure Summary:  The patient was taken to the operating room where general anesthesia was administered and found to be adequate. She was placed in the dorsal supine lithotomy position in [place] stirrups and prepped and draped in usual sterile fashion. After a timeout was called, an indwelling catheter was placed in her bladder. A sterile speculum was placed in the vagina and a single-tooth tenaculum was used to grasp the anterior lip of the cervix. A V-Care uterine manipulator was affixed to the uterus in accordance to the manufacturers recommendations. The speculum and tenaculum was removed from the vagina.  Attention was turned to the abdomen where, after injection of local anesthetic, a 5 mm infraumbilical incision was made with the scalpel. Entry into the abdomen was obtained via Optiview trocar technique (a blunt  entry technique with camera visualization through the obturator upon entry). Verification of entry into the abdomen was obtained using opening pressures. The abdomen was insufflated with CO2. The camera was introduced through the trocar with verification of atraumatic entry. A left lower quadrant 5 mm port was created via direct intra-abdominal camera visualization without difficulty. An 8 mm Air Seal port in the right lower quadrant was placed in a similar fashion without difficulty.  After inspection of the abdomen and pelvis with the above-noted findings, the bilateral ureters were identified and found to be well away from the operative area of interest. The left fallopian tube was grasped at the fimbriated end and was transected using the LigaSure along the mesosalpinx in a lateral to medial fashion. The LigaSure then was used to transect the left round ligament and the utero-ovarian ligament was transected. Tissue was divided along the left broad ligament to the level of the anterior cervical os. The lower uterine segment was identified. The vesicouterine peritoneum was dissected off the lower uterine segment and cervix without difficulty. The left uterine artery was skeletonized and identified and after ligation was transected with the LigaSure device. The same procedure was carried out on the right side. The colpotomy was performed using monopolar electrocautery in a circumferential fashion following the KOH ring.  In order to remove the uterus from the vagina, the uterus was grasped and the fibroid was removed from the uterus.  The uterus and fallopian tubes and cervix were removed through the vagina. Then, the fibroid was removed separately from the vagina.   Cystoscopy was undertaken at this point. The Foley catheter was removed and the 30 cystoscope was gently introduced through the urethra. The bladder survey was undertaken  with efflux of urine from both orifices noted. There were no defects noted  in the bladder wall. The cystoscope was removed and the Foley catheter was replaced.  Attention was returned to the pelvis and closure of the vaginal cuff was undertaken using the V-lock stitch in a running fashion. A gloved hand was placed in the vagina to assess adequate closure of the vaginal cuff. This was found to be satisfactory. All vascular pedicles were inspected and found to be hemostatic. Copious irrigation was undertaken and hemostasis was again verified. The right lower quadrant trocar was removed and the fascia was reapproximated using #0 Vicryl with a single stitch. The abdomen was then desufflated of CO2 after removal of all instruments. Five deep breaths were given by anesthesia to the patient to help with removal of CO2 from the abdomen. The right lower quadrant skin incision was closed using 4-0 Vicryl in a subcuticular fashion. The remaining skin incisions were closed using surgical skin glue and a layer of surgical skin glue was placed over the right lower quadrant skin incision, as well. The catheter was then removed from the bladder. The vagina was inspected and found to be free of instrumentation and sponges.  The surgical assistant placed the left lower quadrant port, She performed the dissection along the left side of the uterus by removing the fallopian tube, transecting along the broad ligament, cauterizing and transecting the left uterine artery, and starting the transection of the vesicouterine peritoneum anteriorly.  Further, she assisted with retraction, visualization with the camera, retraction of bowel tissue out of the pelvis. She also removed the fibroid from the uterus prior to my removing all structures from the vagina.    The patient tolerated the procedure well.  Sponge, lap, needle, and instrument counts were correct x 2.  VTE prophylaxis: SCDs. Antibiotic prophylaxis: Ancef 2 grams IV prior to skin incision. She was awakened in the operating room and was taken to the  PACU in stable condition. The assistant surgeon was an MD due to lack of availability of another Counselling psychologist.   Prentice Docker, MD 05/13/2019 12:44 PM

## 2019-05-14 LAB — SURGICAL PATHOLOGY

## 2019-05-14 NOTE — Progress Notes (Signed)
Complete hysterectomy. Large fibroid tumor.

## 2019-05-22 ENCOUNTER — Ambulatory Visit (INDEPENDENT_AMBULATORY_CARE_PROVIDER_SITE_OTHER): Payer: Self-pay | Admitting: Obstetrics and Gynecology

## 2019-05-22 ENCOUNTER — Encounter: Payer: Self-pay | Admitting: Obstetrics and Gynecology

## 2019-05-22 ENCOUNTER — Other Ambulatory Visit: Payer: Self-pay

## 2019-05-22 VITALS — BP 126/84 | Ht 66.0 in

## 2019-05-22 DIAGNOSIS — D259 Leiomyoma of uterus, unspecified: Secondary | ICD-10-CM

## 2019-05-22 DIAGNOSIS — Z09 Encounter for follow-up examination after completed treatment for conditions other than malignant neoplasm: Secondary | ICD-10-CM

## 2019-05-22 DIAGNOSIS — N920 Excessive and frequent menstruation with regular cycle: Secondary | ICD-10-CM

## 2019-05-22 NOTE — Progress Notes (Signed)
   Postoperative Follow-up Patient presents post op from Total laparoscopic hysterectomy, bilateral salpingectomy, cystoscopy 9 days ago for menorrhagia with regular cycle (abnormal uterine bleeding), abdominal pain, fibroid uterus.  Subjective: Patient reports marked improvement in her preop symptoms. Eating a regular diet without difficulty. The patient's pain is controlled with ibuprofen.  Activity: increasing slowly. She specifically denies fevers, chills, nausea, vomiting. She is voiding and has had a bowel movement.  Objective: Vitals:   05/22/19 1632  BP: 126/84   Vital Signs: BP 126/84   Ht 5\' 6"  (1.676 m)   BMI 35.83 kg/m  Constitutional: Well nourished, well developed female in no acute distress.  HEENT: normal Skin: Warm and dry.  Extremity: no edema  Abdomen: s/nt/nd/+BS Clean, dry, intact, without erythema, induration, warmth, and tenderness. It is clean, dry, and intact.    Assessment: 49 y.o. s/p TLH/BS/Cystoscoy progressing well  Plan: Patient has done well after surgery with no apparent complications.  I have discussed the post-operative course to date, and the expected progress moving forward.  The patient understands what complications to be concerned about.  I will see the patient in routine follow up, or sooner if needed.    Activity plan: increase slowly, pelvic rest for 8 weeks following surgery.  Return in about 5 weeks (around 06/26/2019) for Six week post op visit.   Prentice Docker 05/22/2019, 4:36 PM

## 2019-06-06 ENCOUNTER — Telehealth: Payer: Self-pay

## 2019-06-06 NOTE — Telephone Encounter (Signed)
Patient confirmed virtual visit on 06/09/2019. klh

## 2019-06-09 ENCOUNTER — Ambulatory Visit: Payer: Self-pay | Admitting: Nurse Practitioner

## 2019-06-09 ENCOUNTER — Encounter: Payer: Self-pay | Admitting: Nurse Practitioner

## 2019-06-09 DIAGNOSIS — M545 Low back pain, unspecified: Secondary | ICD-10-CM

## 2019-06-09 DIAGNOSIS — G2581 Restless legs syndrome: Secondary | ICD-10-CM

## 2019-06-09 DIAGNOSIS — G8929 Other chronic pain: Secondary | ICD-10-CM

## 2019-06-09 DIAGNOSIS — F411 Generalized anxiety disorder: Secondary | ICD-10-CM

## 2019-06-09 DIAGNOSIS — F5105 Insomnia due to other mental disorder: Secondary | ICD-10-CM

## 2019-06-09 DIAGNOSIS — M792 Neuralgia and neuritis, unspecified: Secondary | ICD-10-CM

## 2019-06-09 DIAGNOSIS — F409 Phobic anxiety disorder, unspecified: Secondary | ICD-10-CM

## 2019-06-09 DIAGNOSIS — E039 Hypothyroidism, unspecified: Secondary | ICD-10-CM

## 2019-06-09 MED ORDER — ZOLPIDEM TARTRATE ER 12.5 MG PO TBCR: 12.5000 mg | EXTENDED_RELEASE_TABLET | Freq: Every evening | ORAL | 3 refills | Status: DC | PRN

## 2019-06-09 MED ORDER — LEVOTHYROXINE SODIUM 200 MCG PO TABS: 200.0000 ug | ORAL_TABLET | Freq: Every day | ORAL | 3 refills | Status: DC

## 2019-06-09 MED ORDER — TIZANIDINE HCL 4 MG PO TABS: 2.0000 mg | ORAL_TABLET | Freq: Two times a day (BID) | ORAL | 3 refills | Status: DC | PRN

## 2019-06-09 MED ORDER — ROPINIROLE HCL 1 MG PO TABS: 1.0000 mg | ORAL_TABLET | Freq: Every day | ORAL | 3 refills | Status: DC

## 2019-06-09 MED ORDER — GABAPENTIN 300 MG PO CAPS: 300.0000 mg | ORAL_CAPSULE | Freq: Two times a day (BID) | ORAL | 3 refills | Status: DC

## 2019-06-09 MED ORDER — HYDROXYZINE HCL 50 MG PO TABS: 50.0000 mg | ORAL_TABLET | Freq: Three times a day (TID) | ORAL | 3 refills | Status: DC | PRN

## 2019-06-09 NOTE — Progress Notes (Signed)
[facility] [place], [place] [address]  Internal MEDICINE  Telephone Visit  Patient Name: Stephanie Larson  [id number]  [id number]  Date of Service: 06/09/2019  I connected with the patient at 2:35pm  by webcam and verified the patients identity using two identifiers.   I discussed the limitations, risks, security and privacy concerns of performing an evaluation and management service by webcam and the availability of in person appointments. I also discussed with the patient that there may be a patient responsible charge related to the service.  The patient expressed understanding and agrees to proceed.    Chief Complaint  Patient presents with  . Telephone Screen  . Telephone Assessment  . Follow-up    2 month follow up, endo referral   . Medication Refill    zolpidem tart ER 12.5 MG Tab  . Depression  . Sleep Apnea    The patient has been contacted via webcam for follow up visit due to concerns for spread of novel coronavirus. The patient presents for follow up visit. Had hysterectomy since her last visit. Had uterus. Cervix, and fallopian tubes removed. Doing well. Had post-op visit and was doing well. Will go back to see GYN on 06/23/2019 for pelvic exam. She states that she is improving.  States that since her surgery, her hair has been falling out, double than normal. 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 Was referred to see endocrinologist. Specialist she saw told her she should come off her thyroid medication altogether. She does not wish to go back to this particular endocrinologist. She has done some research and has found a provider she would like to see and needs to have a new referral.       Current Medication: Outpatient Encounter Medications as of 06/09/2019  Medication Sig  . gabapentin (NEURONTIN) 300 MG capsule Take 1 capsule (300 mg total) by mouth 2 (two) times daily.  . hydrOXYzine (ATARAX/VISTARIL) 50 MG tablet Take 1 tablet (50 mg  total) by mouth 3 (three) times daily as needed.  Marland Kitchen ibuprofen (ADVIL) 600 MG tablet Take 1 tablet (600 mg total) by mouth every 6 (six) hours.  Marland Kitchen levothyroxine (SYNTHROID) 200 MCG tablet Take 1 tablet (200 mcg total) by mouth daily before breakfast.  . mometasone (ELOCON) 0.1 % cream APPLY TO AFFECTED AREAS FROM FACE TO LOWER EXTREMITIES TWICE A DAY TO SPOT TREAT DURING FLARES  . oxyCODONE-acetaminophen (PERCOCET) 5-325 MG tablet Take 1 tablet by mouth every 4 (four) hours as needed for severe pain.  Marland Kitchen rOPINIRole (REQUIP) 1 MG tablet Take 1 tablet (1 mg total) by mouth at bedtime.  Marland Kitchen tiZANidine (ZANAFLEX) 4 MG tablet Take 0.5-1 tablets (2-4 mg total) by mouth 2 (two) times daily as needed for muscle spasms.  Marland Kitchen zolpidem (AMBIEN CR) 12.5 MG CR tablet Take 1 tablet (12.5 mg total) by mouth at bedtime as needed for sleep.  . [DISCONTINUED] gabapentin (NEURONTIN) 300 MG capsule Take 1 capsule (300 mg total) by mouth 2 (two) times daily.  . [DISCONTINUED] hydrOXYzine (ATARAX/VISTARIL) 50 MG tablet Take 1 tablet (50 mg total) by mouth 3 (three) times daily as needed.  . [DISCONTINUED] levothyroxine (SYNTHROID) 200 MCG tablet Take 1 tablet (200 mcg total) by mouth daily before breakfast.  . [DISCONTINUED] rOPINIRole (REQUIP) 1 MG tablet Take 1 tablet (1 mg total) by mouth at bedtime.  . [DISCONTINUED] tiZANidine (ZANAFLEX) 4 MG tablet Take 0.5-1 tablets (2-4 mg total) by mouth 2 (two) times daily as needed for muscle  spasms.  . [DISCONTINUED] zolpidem (AMBIEN CR) 12.5 MG CR tablet Take 1 tablet (12.5 mg total) by mouth at bedtime as needed for sleep.   No facility-administered encounter medications on file as of 06/09/2019.    Surgical History: Past Surgical History:  Procedure Laterality Date  . CYSTOSCOPY N/A 05/13/2019   Procedure: CYSTOSCOPY;  Surgeon: Will Bonnet, MD;  Location: [facility] ORS;  Service: Gynecology;  Laterality: N/A;  . LAPAROSCOPIC GASTRIC BANDING    . TOTAL LAPAROSCOPIC  HYSTERECTOMY WITH SALPINGECTOMY N/A 05/13/2019   Procedure: TOTAL LAPAROSCOPIC HYSTERECTOMY WITH BILATERAL SALPINGECTOMY;  Surgeon: Will Bonnet, MD;  Location: [facility] ORS;  Service: Gynecology;  Laterality: N/A;  . TUBAL LIGATION      Medical History: Past Medical History:  Diagnosis Date  . Anxiety   . Depression   . Family history of ovarian cancer    qualifies for cancer genetic testing  . Hypothyroidism   . Insomnia   . Memory changes   . Sinus drainage   . Sleep apnea   . Thyroid disease     Family History: Family History  Problem Relation Age of Onset  . Colon cancer Paternal Grandfather   . Ovarian cancer Paternal Grandmother 41  . Hypertension Maternal Grandmother   . Hypertension Father   . Emphysema Father   . ALS Mother   . Hypertension Mother     Social History   Socioeconomic History  . Marital status: Married    Spouse name: Not on file  . Number of children: Not on file  . Years of education: Not on file  . Highest education level: Not on file  Occupational History  . Not on file  Tobacco Use  . Smoking status: Current Every Day Smoker    Packs/day: 0.50    Types: Cigarettes  . Smokeless tobacco: Never Used  Substance and Sexual Activity  . Alcohol use: Not Currently  . Drug use: Never  . Sexual activity: Yes    Birth control/protection: None  Other Topics Concern  . Not on file  Social History Narrative  . Not on file   Social Determinants of Health   Financial Resource Strain:   . Difficulty of Paying Living Expenses: Not on file  Food Insecurity:   . Worried About Charity fundraiser in the Last Year: Not on file  . Ran Out of Food in the Last Year: Not on file  Transportation Needs:   . Lack of Transportation (Medical): Not on file  . Lack of Transportation (Non-Medical): Not on file  Physical Activity:   . Days of Exercise per Week: Not on file  . Minutes of Exercise per Session: Not on file  Stress:   . Feeling of Stress  : Not on file  Social Connections:   . Frequency of Communication with Friends and Family: Not on file  . Frequency of Social Gatherings with Friends and Family: Not on file  . Attends Religious Services: Not on file  . Active Member of Clubs or Organizations: Not on file  . Attends Archivist Meetings: Not on file  . Marital Status: Not on file  Intimate Partner Violence:   . Fear of Current or Ex-Partner: Not on file  . Emotionally Abused: Not on file  . Physically Abused: Not on file  . Sexually Abused: Not on file      Review of Systems  Constitutional: Positive for fatigue. Negative for chills and diaphoresis.  HENT: Negative for ear pain,  postnasal drip and sinus pressure.   Respiratory: Negative for cough, shortness of breath and wheezing.   Cardiovascular: Negative for chest pain and palpitations.  Gastrointestinal: Negative for abdominal pain, constipation, diarrhea, nausea and vomiting.  Endocrine: Negative for cold intolerance, heat intolerance, polydipsia and polyuria.       Auto-immune hypothyroid. Hair has been falling out a lot more than normal since having hysterectomy.   Musculoskeletal: Positive for arthralgias, back pain and myalgias. Negative for gait problem and neck pain.  Skin: Negative for color change.  Allergic/Immunologic: Negative for environmental allergies and food allergies.  Neurological: Positive for headaches. Negative for dizziness.  Hematological: Does not bruise/bleed easily.  Psychiatric/Behavioral: Positive for dysphoric mood and sleep disturbance. Negative for agitation, behavioral problems (depression) and hallucinations. The patient is nervous/anxious.        Still having problems with sleep despite changing fro Ambien CR to lunesta 3mg  at bedtime as needed    Today's Vitals   06/09/19 1409  Weight: 225 lb (102.1 kg)  Height: 5\' 6"  (1.676 m)   Body mass index is 36.32 kg/m.  Observation/Objective:   The patient is alert  and oriented. She is pleasant and answers all questions appropriately. Breathing is non-labored. She is in no acute distress at this time.    Assessment/Plan:  1. Hypothyroidism, unspecified type Check thyroid panel. Adjust levothyroxine dose as indicated. New referral to Dr. Buddy Duty, endocrinologist in [place].  - levothyroxine (SYNTHROID) 200 MCG tablet; Take 1 tablet (200 mcg total) by mouth daily before breakfast.  Dispense: 30 tablet; Refill: 3 - Ambulatory referral to Endocrinology - TSH + free T4; Future  2. Restless leg syndrome Stable. Continue requip 1mg  at bedtime.  - rOPINIRole (REQUIP) 1 MG tablet; Take 1 tablet (1 mg total) by mouth at bedtime.  Dispense: 30 tablet; Refill: 3  3. Neuralgia and neuritis, unspecified Continue gabapentin 300mg  twice daily.  - gabapentin (NEURONTIN) 300 MG capsule; Take 1 capsule (300 mg total) by mouth 2 (two) times daily.  Dispense: 60 capsule; Refill: 3  4. Chronic midline low back pain without sciatica May take tizanidine 4mg  up to twice daily as needed for muscle pain. Refills provided today.  - tiZANidine (ZANAFLEX) 4 MG tablet; Take 0.5-1 tablets (2-4 mg total) by mouth 2 (two) times daily as needed for muscle spasms.  Dispense: 60 tablet; Refill: 3  5. GAD (generalized anxiety disorder) May take atarax up to three times daily as needed for acute anxiety. New prescription sent to her pharmacy.  - hydrOXYzine (ATARAX/VISTARIL) 50 MG tablet; Take 1 tablet (50 mg total) by mouth 3 (three) times daily as needed.  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 Refills provided today.  - zolpidem (AMBIEN CR) 12.5 MG CR tablet; Take 1 tablet (12.5 mg total) by mouth at bedtime as needed for sleep.  Dispense: 30 tablet; Refill: 3  General Counseling: Rut verbalizes understanding of the findings of today's phone visit and agrees with plan of treatment. I have  discussed any further diagnostic evaluation that may be needed or ordered today. We also reviewed her medications today. she has been encouraged to call the office with any questions or concerns that should arise related to todays visit.  This patient was seen by Leretha Pol FNP Collaboration with Dr Lavera Guise as a part of collaborative care agreement  Orders Placed This Encounter  Procedures  . TSH +  free T4  . Ambulatory referral to Endocrinology    Meds ordered this encounter  Medications  . gabapentin (NEURONTIN) 300 MG capsule    Sig: Take 1 capsule (300 mg total) by mouth 2 (two) times daily.    Dispense:  60 capsule    Refill:  3    Order Specific Question:   Supervising Provider    Answer:   Lavera Guise [id number]  . hydrOXYzine (ATARAX/VISTARIL) 50 MG tablet    Sig: Take 1 tablet (50 mg total) by mouth 3 (three) times daily as needed.    Dispense:  30 tablet    Refill:  3    Order Specific Question:   Supervising Provider    Answer:   Lavera Guise [id number]  . levothyroxine (SYNTHROID) 200 MCG tablet    Sig: Take 1 tablet (200 mcg total) by mouth daily before breakfast.    Dispense:  30 tablet    Refill:  3    Order Specific Question:   Supervising Provider    Answer:   Lavera Guise [place]  . rOPINIRole (REQUIP) 1 MG tablet    Sig: Take 1 tablet (1 mg total) by mouth at bedtime.    Dispense:  30 tablet    Refill:  3    Order Specific Question:   Supervising Provider    Answer:   Lavera Guise [id number]  . tiZANidine (ZANAFLEX) 4 MG tablet    Sig: Take 0.5-1 tablets (2-4 mg total) by mouth 2 (two) times daily as needed for muscle spasms.    Dispense:  60 tablet    Refill:  3    Order Specific Question:   Supervising Provider    Answer:   Lavera Guise [id number]  . zolpidem (AMBIEN CR) 12.5 MG CR tablet    Sig: Take 1 tablet (12.5 mg total) by mouth at bedtime as needed for sleep.    Dispense:  30 tablet    Refill:  3    Order Specific Question:   Supervising  Provider    Answer:   Lavera Guise [id number]    Time spent: 36 Minutes    Dr Lavera Guise Internal medicine

## 2019-06-23 ENCOUNTER — Other Ambulatory Visit: Payer: Self-pay

## 2019-06-23 ENCOUNTER — Encounter: Payer: Self-pay | Admitting: Obstetrics and Gynecology

## 2019-06-23 ENCOUNTER — Ambulatory Visit (INDEPENDENT_AMBULATORY_CARE_PROVIDER_SITE_OTHER): Payer: Self-pay | Admitting: Obstetrics and Gynecology

## 2019-06-23 VITALS — BP 130/76 | Wt 223.0 lb

## 2019-06-23 DIAGNOSIS — Z09 Encounter for follow-up examination after completed treatment for conditions other than malignant neoplasm: Secondary | ICD-10-CM

## 2019-06-23 DIAGNOSIS — Z9889 Other specified postprocedural states: Secondary | ICD-10-CM

## 2019-06-23 NOTE — Progress Notes (Signed)
   Postoperative Follow-up Patient presents post op from total laparoscopic hysterectomy, bilateral salpingectomy, cystoscopy 6 weeks ago for menorrhagia with regular cycle, abdominal pain, fibroid uterus.  Subjective: Patient reports marked improvement in her preop symptoms. Eating a regular diet without difficulty. The patient is not having any pain.  Activity: normal activities of daily living.  Objective: Vitals:   06/23/19 1613  BP: 130/76   Vital Signs: BP 130/76   Wt 223 lb (101.2 kg)   LMP 04/18/2019 (Exact Date)   BMI 35.99 kg/m  Constitutional: Well nourished, well developed female in no acute distress.  HEENT: normal Skin: Warm and dry.  Extremity: no edema  Abdomen: Soft, non-tender, normal bowel sounds; no bruits, organomegaly or masses. clean, dry and intact  Pelvic exam: (female chaperone present) is not limited by body habitus EGBUS: within normal limits Vagina: within normal limits and with no blood in the vault. Vaginal cuff is intact without tenderness and induration  Female chaperone present for pelvic exam:  Assessment: 49 y.o. s/p TLH/BS/Cysto progressing well  Plan: Patient has done well after surgery with no apparent complications.  I have discussed the post-operative course to date, and the expected progress moving forward.  The patient understands what complications to be concerned about.  I will see the patient in routine follow up, or sooner if needed.    Activity plan: No restriction. (no intercourse until 8 weeks postpartum)  Prentice Docker, MD 06/23/2019, 4:26 PM

## 2019-07-28 ENCOUNTER — Other Ambulatory Visit: Payer: Self-pay

## 2019-07-28 DIAGNOSIS — F411 Generalized anxiety disorder: Secondary | ICD-10-CM

## 2019-07-28 MED ORDER — HYDROXYZINE HCL 50 MG PO TABS: 50.0000 mg | ORAL_TABLET | Freq: Three times a day (TID) | ORAL | 1 refills | Status: DC | PRN

## 2019-09-01 ENCOUNTER — Telehealth: Payer: Self-pay

## 2019-09-01 NOTE — Telephone Encounter (Signed)
Confirmed appointment on 09/02/2019 and screened for covid. klh

## 2019-09-02 ENCOUNTER — Other Ambulatory Visit: Payer: Self-pay

## 2019-09-02 ENCOUNTER — Encounter: Payer: Self-pay | Admitting: Nurse Practitioner

## 2019-09-02 ENCOUNTER — Ambulatory Visit: Payer: Self-pay | Admitting: Nurse Practitioner

## 2019-09-02 VITALS — BP 137/97 | HR 81 | Temp 97.2°F | Resp 16 | Ht 66.0 in | Wt 224.0 lb

## 2019-09-02 DIAGNOSIS — J301 Allergic rhinitis due to pollen: Secondary | ICD-10-CM

## 2019-09-02 DIAGNOSIS — K047 Periapical abscess without sinus: Secondary | ICD-10-CM

## 2019-09-02 MED ORDER — AMOXICILLIN-POT CLAVULANATE 875-125 MG PO TABS: 1.0000 | ORAL_TABLET | Freq: Two times a day (BID) | ORAL | 0 refills | Status: DC

## 2019-09-02 MED ORDER — FEXOFENADINE HCL 180 MG PO TABS: 180.0000 mg | ORAL_TABLET | Freq: Every day | ORAL | 5 refills | Status: DC

## 2019-09-02 MED ORDER — CHLORHEXIDINE GLUCONATE 0.12 % MT SOLN: 10.0000 mL | Freq: Two times a day (BID) | OROMUCOSAL | 1 refills | Status: DC

## 2019-09-02 NOTE — Progress Notes (Signed)
[facility] [place], [place] [address]  Internal MEDICINE  Office Visit Note  Patient Name: Stephanie Larson  [id number]  [id number]  Date of Service: 09/14/2019  Chief Complaint  Patient presents with  . Facial Swelling     Patient in office for sick visit. Swelling in right lower side of the jaw. Infection of teeth of lower right jaw. A little tender. There is some swelling in the lymph nodes of right side of neck as well. Started Friday. Ha been alternating cold and hot on the jaw area which has helped some. Denies fever. Does have moderate nasal congestion. She believes this from pollen.   Pt is here for a sick visit.     Current Medication:  Outpatient Encounter Medications as of 09/02/2019  Medication Sig Note  . doxycycline (DORYX) 100 MG EC tablet Take 100 mg by mouth 2 (two) times daily.   Marland Kitchen gabapentin (NEURONTIN) 300 MG capsule Take 1 capsule (300 mg total) by mouth 2 (two) times daily.   . hydrOXYzine (ATARAX/VISTARIL) 50 MG tablet Take 1 tablet (50 mg total) by mouth 3 (three) times daily as needed.   Marland Kitchen levothyroxine (SYNTHROID) 200 MCG tablet Take 1 tablet (200 mcg total) by mouth daily before breakfast.   . mometasone (ELOCON) 0.1 % cream APPLY TO AFFECTED AREAS FROM FACE TO LOWER EXTREMITIES TWICE A DAY TO SPOT TREAT DURING FLARES 06/23/2019: As needed for Eczema  . rOPINIRole (REQUIP) 1 MG tablet Take 1 tablet (1 mg total) by mouth at bedtime.   Marland Kitchen tiZANidine (ZANAFLEX) 4 MG tablet Take 0.5-1 tablets (2-4 mg total) by mouth 2 (two) times daily as needed for muscle spasms.   Marland Kitchen zolpidem (AMBIEN CR) 12.5 MG CR tablet Take 1 tablet (12.5 mg total) by mouth at bedtime as needed for sleep.   Marland Kitchen amoxicillin-clavulanate (AUGMENTIN) 875-125 MG tablet Take 1 tablet by mouth 2 (two) times daily.   . chlorhexidine (PERIDEX) 0.12 % solution Use as directed 10 mLs in the mouth or throat 2 (two) times daily.   . fexofenadine (ALLEGRA) 180 MG tablet Take 1 tablet  (180 mg total) by mouth daily.   . [DISCONTINUED] ibuprofen (ADVIL) 600 MG tablet Take 1 tablet (600 mg total) by mouth every 6 (six) hours. (Patient not taking: Reported on 09/02/2019)    No facility-administered encounter medications on file as of 09/02/2019.      Medical History: Past Medical History:  Diagnosis Date  . Anxiety   . Depression   . Family history of ovarian cancer    qualifies for cancer genetic testing  . Hypothyroidism   . Insomnia   . Memory changes   . Sinus drainage   . Sleep apnea   . Thyroid disease      Today's Vitals   09/02/19 1021  BP: (!) 137/97  Pulse: 81  Resp: 16  Temp: (!) 97.2 F (36.2 C)  SpO2: 98%  Weight: 224 lb (101.6 kg)  Height: 5\' 6"  (1.676 m)   Body mass index is 36.15 kg/m.  Review of Systems  Constitutional: Positive for fatigue. Negative for activity change, chills, fever and unexpected weight change.  HENT: Positive for congestion, dental problem, postnasal drip, rhinorrhea, sinus pressure and sinus pain. Negative for sneezing and sore throat.   Respiratory: Negative for cough, chest tightness, shortness of breath and wheezing.   Cardiovascular: Negative for chest pain and palpitations.  Gastrointestinal: Positive for nausea. Negative for abdominal pain, constipation, diarrhea and vomiting.  Musculoskeletal: Negative for arthralgias, back pain, joint swelling and neck pain.  Skin: Negative for rash.  Allergic/Immunologic: Positive for environmental allergies.  Neurological: Positive for dizziness and headaches. Negative for tremors and numbness.  Hematological: Positive for adenopathy. Does not bruise/bleed easily.  Psychiatric/Behavioral: Positive for sleep disturbance. Negative for behavioral problems (Depression) and suicidal ideas. The patient is nervous/anxious.     Physical Exam Vitals and nursing note reviewed.  Constitutional:      General: She is not in acute distress.    Appearance: Normal appearance. She is  well-developed. She is ill-appearing. She is not diaphoretic.  HENT:     Head: Normocephalic and atraumatic.     Right Ear: Tympanic membrane is erythematous and bulging.     Left Ear: Ear canal and external ear normal.     Nose: Congestion present.     Right Turbinates: Enlarged and swollen.     Left Turbinates: Not enlarged or swollen.     Right Sinus: Maxillary sinus tenderness and frontal sinus tenderness present.     Left Sinus: No maxillary sinus tenderness or frontal sinus tenderness.     Mouth/Throat:     Pharynx: No oropharyngeal exudate.     Comments: Dental caries present in the right lower part of the jaw. There is swelling and moderate tenderness present along the right lower mandibular area. Cervical lymph nodes present on the right side of the neck.  Eyes:     Pupils: Pupils are equal, round, and reactive to light.  Neck:     Thyroid: No thyromegaly.     Vascular: No JVD.     Trachea: No tracheal deviation.  Cardiovascular:     Rate and Rhythm: Normal rate and regular rhythm.     Heart sounds: Normal heart sounds. No murmur. No friction rub. No gallop.   Pulmonary:     Effort: Pulmonary effort is normal. No respiratory distress.     Breath sounds: Normal breath sounds. No wheezing or rales.  Chest:     Chest wall: No tenderness.  Abdominal:     Palpations: Abdomen is soft.  Musculoskeletal:        General: Normal range of motion.     Cervical back: Normal range of motion and neck supple.  Lymphadenopathy:     Cervical: Cervical adenopathy present.  Skin:    General: Skin is warm and dry.  Neurological:     Mental Status: She is alert and oriented to person, place, and time.     Cranial Nerves: No cranial nerve deficit.  Psychiatric:        Behavior: Behavior normal.        Thought Content: Thought content normal.        Judgment: Judgment normal.   Assessment/Plan: 1. Dental infection Start augmentin 875mg  twice daily for 10 days. Rest and increase fluids.  Recommend OTC medication as needed and as indicated to improve acute symptoms.  - amoxicillin-clavulanate (AUGMENTIN) 875-125 MG tablet; Take 1 tablet by mouth 2 (two) times daily.  Dispense: 20 tablet; Refill: 0 - chlorhexidine (PERIDEX) 0.12 % solution; Use as directed 10 mLs in the mouth or throat 2 (two) times daily.  Dispense: 120 mL; Refill: 1  2. Seasonal allergic rhinitis due to pollen New prescription for allegra 180mg  daily.  - fexofenadine (ALLEGRA) 180 MG tablet; Take 1 tablet (180 mg total) by mouth daily.  Dispense: 30 tablet; Refill: 5  General Counseling: Nikeisha verbalizes understanding of the findings of todays visit and agrees  with plan of treatment. I have discussed any further diagnostic evaluation that may be needed or ordered today. We also reviewed her medications today. she has been encouraged to call the office with any questions or concerns that should arise related to todays visit.    Counseling:    This patient was seen by [place] with Dr Lavera Guise as a part of collaborative care agreement  Meds ordered this encounter  Medications  . amoxicillin-clavulanate (AUGMENTIN) 875-125 MG tablet    Sig: Take 1 tablet by mouth 2 (two) times daily.    Dispense:  20 tablet    Refill:  0    Order Specific Question:   Supervising Provider    Answer:   Lavera Guise [id number]  . chlorhexidine (PERIDEX) 0.12 % solution    Sig: Use as directed 10 mLs in the mouth or throat 2 (two) times daily.    Dispense:  120 mL    Refill:  1    Order Specific Question:   Supervising Provider    Answer:   Lavera Guise [id number]  . fexofenadine (ALLEGRA) 180 MG tablet    Sig: Take 1 tablet (180 mg total) by mouth daily.    Dispense:  30 tablet    Refill:  5    Order Specific Question:   Supervising Provider    Answer:   Lavera Guise [id number]    Time spent: 20 Minutes

## 2019-09-14 DIAGNOSIS — K047 Periapical abscess without sinus: Secondary | ICD-10-CM | POA: Insufficient documentation

## 2019-09-14 DIAGNOSIS — J301 Allergic rhinitis due to pollen: Secondary | ICD-10-CM | POA: Insufficient documentation

## 2019-09-15 ENCOUNTER — Other Ambulatory Visit: Payer: Self-pay | Admitting: Nurse Practitioner

## 2019-09-15 ENCOUNTER — Telehealth: Payer: Self-pay | Admitting: Nurse Practitioner

## 2019-09-15 DIAGNOSIS — B3731 Acute candidiasis of vulva and vagina: Secondary | ICD-10-CM

## 2019-09-15 DIAGNOSIS — B373 Candidiasis of vulva and vagina: Secondary | ICD-10-CM

## 2019-09-15 MED ORDER — FLUCONAZOLE 150 MG PO TABS: ORAL_TABLET | ORAL | 0 refills | Status: DC

## 2019-09-15 NOTE — Telephone Encounter (Signed)
Left  pt message medication called in.

## 2019-09-15 NOTE — Telephone Encounter (Signed)
Please let the patient know that due to yeast infection symptoms afte rantibiotics. Sent diflucan 150mg . Take once. May repeat in three days for persistent symptoms.

## 2019-09-15 NOTE — Progress Notes (Signed)
Yeast infection symptosm afte rantibiotics. Sent diflucan 150mg . Take once. May repeat in three days for persistent symptoms.

## 2019-09-22 ENCOUNTER — Other Ambulatory Visit: Payer: Self-pay

## 2019-09-22 DIAGNOSIS — F411 Generalized anxiety disorder: Secondary | ICD-10-CM

## 2019-09-22 MED ORDER — HYDROXYZINE HCL 50 MG PO TABS: 50.0000 mg | ORAL_TABLET | Freq: Three times a day (TID) | ORAL | 1 refills | Status: DC | PRN

## 2019-10-03 ENCOUNTER — Telehealth: Payer: Self-pay

## 2019-10-03 NOTE — Telephone Encounter (Signed)
Lmom to confirm and screen for 10-07-19 ov. 

## 2019-10-07 ENCOUNTER — Ambulatory Visit: Payer: Self-pay | Admitting: Nurse Practitioner

## 2019-10-07 ENCOUNTER — Encounter: Payer: Self-pay | Admitting: Nurse Practitioner

## 2019-10-07 ENCOUNTER — Other Ambulatory Visit: Payer: Self-pay

## 2019-10-07 VITALS — BP 162/80 | HR 74 | Temp 97.3°F | Resp 16 | Ht 66.0 in | Wt 219.8 lb

## 2019-10-07 DIAGNOSIS — F409 Phobic anxiety disorder, unspecified: Secondary | ICD-10-CM

## 2019-10-07 DIAGNOSIS — G8929 Other chronic pain: Secondary | ICD-10-CM

## 2019-10-07 DIAGNOSIS — F5105 Insomnia due to other mental disorder: Secondary | ICD-10-CM

## 2019-10-07 DIAGNOSIS — G2581 Restless legs syndrome: Secondary | ICD-10-CM

## 2019-10-07 DIAGNOSIS — M792 Neuralgia and neuritis, unspecified: Secondary | ICD-10-CM

## 2019-10-07 DIAGNOSIS — M545 Low back pain: Secondary | ICD-10-CM

## 2019-10-07 DIAGNOSIS — F411 Generalized anxiety disorder: Secondary | ICD-10-CM

## 2019-10-07 DIAGNOSIS — E039 Hypothyroidism, unspecified: Secondary | ICD-10-CM

## 2019-10-07 MED ORDER — ZOLPIDEM TARTRATE ER 12.5 MG PO TBCR: 12.5000 mg | EXTENDED_RELEASE_TABLET | Freq: Every evening | ORAL | 3 refills | Status: DC | PRN

## 2019-10-07 MED ORDER — TIZANIDINE HCL 4 MG PO TABS: 2.0000 mg | ORAL_TABLET | Freq: Two times a day (BID) | ORAL | 3 refills | Status: DC | PRN

## 2019-10-07 MED ORDER — DIAZEPAM 5 MG PO TABS: 5.0000 mg | ORAL_TABLET | Freq: Two times a day (BID) | ORAL | 1 refills | Status: DC | PRN

## 2019-10-07 MED ORDER — ROPINIROLE HCL 1 MG PO TABS: 1.0000 mg | ORAL_TABLET | Freq: Every day | ORAL | 3 refills | Status: DC

## 2019-10-07 MED ORDER — GABAPENTIN 300 MG PO CAPS: 300.0000 mg | ORAL_CAPSULE | Freq: Two times a day (BID) | ORAL | 3 refills | Status: DC

## 2019-10-07 MED ORDER — LEVOTHYROXINE SODIUM 200 MCG PO TABS: 200.0000 ug | ORAL_TABLET | Freq: Every day | ORAL | 3 refills | Status: DC

## 2019-10-07 NOTE — Progress Notes (Signed)
[facility] [place], [place] [address]  Internal MEDICINE  Office Visit Note  Patient Name: Stephanie Larson  [id number]  [id number]  Date of Service: 10/19/2019  Chief Complaint  Patient presents with  . Follow-up  . Anxiety  . Depression    The patient is here for routine follow up. She is going to [place] on Thursday. She is feeling very anxious about her flight and trip. She is going with 16 family members. They are staying for seven days. She is very nervous about the flight. She was supposed to go with her daughter to [facility] in November and was unable to get on the flight. Wants to know if there is something I can prescribe to help her with acute anxiety she is having about this trip.  She needs to have refills of all routine medications.       Current Medication: Outpatient Encounter Medications as of 10/07/2019  Medication Sig Note  . chlorhexidine (PERIDEX) 0.12 % solution Use as directed 10 mLs in the mouth or throat 2 (two) times daily.   Marland Kitchen doxycycline (DORYX) 100 MG EC tablet Take 100 mg by mouth 2 (two) times daily.   . fexofenadine (ALLEGRA) 180 MG tablet Take 1 tablet (180 mg total) by mouth daily.   . fluconazole (DIFLUCAN) 150 MG tablet Take 1 tablet po once. May repeat dose in 3 days as needed for persistent symptoms.   Marland Kitchen gabapentin (NEURONTIN) 300 MG capsule Take 1 capsule (300 mg total) by mouth 2 (two) times daily.   . hydrOXYzine (ATARAX/VISTARIL) 50 MG tablet Take 1 tablet (50 mg total) by mouth 3 (three) times daily as needed.   Marland Kitchen levothyroxine (SYNTHROID) 200 MCG tablet Take 1 tablet (200 mcg total) by mouth daily before breakfast.   . mometasone (ELOCON) 0.1 % cream APPLY TO AFFECTED AREAS FROM FACE TO LOWER EXTREMITIES TWICE A DAY TO SPOT TREAT DURING FLARES 06/23/2019: As needed for Eczema  . rOPINIRole (REQUIP) 1 MG tablet Take 1 tablet (1 mg total) by mouth at bedtime.   Marland Kitchen tiZANidine (ZANAFLEX) 4 MG tablet Take 0.5-1 tablets (2-4 mg  total) by mouth 2 (two) times daily as needed for muscle spasms.   Marland Kitchen zolpidem (AMBIEN CR) 12.5 MG CR tablet Take 1 tablet (12.5 mg total) by mouth at bedtime as needed for sleep.   . [DISCONTINUED] amoxicillin-clavulanate (AUGMENTIN) 875-125 MG tablet Take 1 tablet by mouth 2 (two) times daily.   . [DISCONTINUED] gabapentin (NEURONTIN) 300 MG capsule Take 1 capsule (300 mg total) by mouth 2 (two) times daily.   . [DISCONTINUED] levothyroxine (SYNTHROID) 200 MCG tablet Take 1 tablet (200 mcg total) by mouth daily before breakfast.   . [DISCONTINUED] rOPINIRole (REQUIP) 1 MG tablet Take 1 tablet (1 mg total) by mouth at bedtime.   . [DISCONTINUED] tiZANidine (ZANAFLEX) 4 MG tablet Take 0.5-1 tablets (2-4 mg total) by mouth 2 (two) times daily as needed for muscle spasms.   . [DISCONTINUED] zolpidem (AMBIEN CR) 12.5 MG CR tablet Take 1 tablet (12.5 mg total) by mouth at bedtime as needed for sleep.   . diazepam (VALIUM) 5 MG tablet Take 1 tablet (5 mg total) by mouth every 12 (twelve) hours as needed for anxiety.    No facility-administered encounter medications on file as of 10/07/2019.    Surgical History: Past Surgical History:  Procedure Laterality Date  . CYSTOSCOPY N/A 05/13/2019   Procedure: CYSTOSCOPY;  Surgeon: Will Bonnet, MD;  Location: [facility] ORS;  Service: Gynecology;  Laterality: N/A;  . LAPAROSCOPIC GASTRIC BANDING    . TOTAL LAPAROSCOPIC HYSTERECTOMY WITH SALPINGECTOMY N/A 05/13/2019   Procedure: TOTAL LAPAROSCOPIC HYSTERECTOMY WITH BILATERAL SALPINGECTOMY;  Surgeon: Will Bonnet, MD;  Location: [facility] ORS;  Service: Gynecology;  Laterality: N/A;  . TUBAL LIGATION      Medical History: Past Medical History:  Diagnosis Date  . Anxiety   . Depression   . Family history of ovarian cancer    qualifies for cancer genetic testing  . Hypothyroidism   . Insomnia   . Memory changes   . Sinus drainage   . Sleep apnea   . Thyroid disease     Family History: Family  History  Problem Relation Age of Onset  . Colon cancer Paternal Grandfather   . Ovarian cancer Paternal Grandmother 24  . Hypertension Maternal Grandmother   . Hypertension Father   . Emphysema Father   . ALS Mother   . Hypertension Mother     Social History   Socioeconomic History  . Marital status: Married    Spouse name: Not on file  . Number of children: Not on file  . Years of education: Not on file  . Highest education level: Not on file  Occupational History  . Not on file  Tobacco Use  . Smoking status: Current Every Day Smoker    Packs/day: 0.50    Types: Cigarettes  . Smokeless tobacco: Never Used  Vaping Use  . Vaping Use: Never used  Substance and Sexual Activity  . Alcohol use: Not Currently    Comment: occasionally  . Drug use: Never  . Sexual activity: Yes    Birth control/protection: None  Other Topics Concern  . Not on file  Social History Narrative  . Not on file   Social Determinants of Health   Financial Resource Strain:   . Difficulty of Paying Living Expenses:   Food Insecurity:   . Worried About Charity fundraiser in the Last Year:   . Arboriculturist in the Last Year:   Transportation Needs:   . Film/video editor (Medical):   Marland Kitchen Lack of Transportation (Non-Medical):   Physical Activity:   . Days of Exercise per Week:   . Minutes of Exercise per Session:   Stress:   . Feeling of Stress :   Social Connections:   . Frequency of Communication with Friends and Family:   . Frequency of Social Gatherings with Friends and Family:   . Attends Religious Services:   . Active Member of Clubs or Organizations:   . Attends Archivist Meetings:   Marland Kitchen Marital Status:   Intimate Partner Violence:   . Fear of Current or Ex-Partner:   . Emotionally Abused:   Marland Kitchen Physically Abused:   . Sexually Abused:       Review of Systems  Constitutional: Negative for activity change, chills, fatigue and unexpected weight change.  HENT:  Negative for congestion, postnasal drip, rhinorrhea, sneezing and sore throat.   Respiratory: Negative for cough, chest tightness, shortness of breath and wheezing.   Cardiovascular: Negative for chest pain and palpitations.  Gastrointestinal: Negative for abdominal pain, constipation, diarrhea, nausea and vomiting.       Blood pressure mildly elevated at today's visit.   Endocrine: Negative for cold intolerance, heat intolerance, polydipsia and polyuria.       Patient now seeing endocrinology for thyroid disease  Musculoskeletal: Positive for arthralgias, back pain and myalgias. Negative for  joint swelling and neck pain.  Skin: Negative for rash.  Allergic/Immunologic: Positive for environmental allergies.  Neurological: Negative for dizziness, tremors, numbness and headaches.  Hematological: Negative for adenopathy. Does not bruise/bleed easily.  Psychiatric/Behavioral: Positive for dysphoric mood and sleep disturbance. Negative for behavioral problems (Depression) and suicidal ideas. The patient is nervous/anxious.     Today's Vitals   10/07/19 1427  BP: (!) 162/80  Pulse: 74  Resp: 16  Temp: (!) 97.3 F (36.3 C)  SpO2: 97%  Weight: 219 lb 12.8 oz (99.7 kg)  Height: 5\' 6"  (1.676 m)   Body mass index is 35.48 kg/m.  Physical Exam Vitals and nursing note reviewed.  Constitutional:      General: She is not in acute distress.    Appearance: Normal appearance. She is well-developed. She is obese. She is not diaphoretic.  HENT:     Head: Normocephalic and atraumatic.     Nose: Nose normal.     Mouth/Throat:     Pharynx: No oropharyngeal exudate.  Eyes:     Pupils: Pupils are equal, round, and reactive to light.  Neck:     Thyroid: No thyromegaly.     Vascular: No JVD.     Trachea: No tracheal deviation.  Cardiovascular:     Rate and Rhythm: Normal rate and regular rhythm.     Heart sounds: Normal heart sounds. No murmur heard.  No friction rub. No gallop.   Pulmonary:      Effort: Pulmonary effort is normal. No respiratory distress.     Breath sounds: Normal breath sounds. No wheezing or rales.  Chest:     Chest wall: No tenderness.  Abdominal:     Palpations: Abdomen is soft.  Musculoskeletal:        General: Normal range of motion.     Cervical back: Normal range of motion and neck supple.  Lymphadenopathy:     Cervical: No cervical adenopathy.  Skin:    General: Skin is warm and dry.  Neurological:     Mental Status: She is alert and oriented to person, place, and time.     Cranial Nerves: No cranial nerve deficit.  Psychiatric:        Attention and Perception: Attention and perception normal.        Mood and Affect: Affect normal. Mood is anxious.        Speech: Speech normal.        Behavior: Behavior normal. Behavior is cooperative.        Thought Content: Thought content normal.        Cognition and Memory: Cognition and memory normal.        Judgment: Judgment normal.    Assessment/Plan: 1. Hypothyroidism, unspecified type Patient should continue levothyroxine 270mcg daily. Continue regular visits with endocrinology as scheudled.  - levothyroxine (SYNTHROID) 200 MCG tablet; Take 1 tablet (200 mcg total) by mouth daily before breakfast.  Dispense: 30 tablet; Refill: 3  2. Chronic midline low back pain without sciatica May continue tizanidine 4mg  up to twice daily as needed for low back pain and muscle spasms.  - tiZANidine (ZANAFLEX) 4 MG tablet; Take 0.5-1 tablets (2-4 mg total) by mouth 2 (two) times daily as needed for muscle spasms.  Dispense: 60 tablet; Refill: 3  3. Neuralgia and neuritis, unspecified Continue gabapentin as prescribed  - gabapentin (NEURONTIN) 300 MG capsule; Take 1 capsule (300 mg total) by mouth 2 (two) times daily.  Dispense: 60 capsule; Refill: 3  4. Restless  leg syndrome Continue ropinirole as prescribed  - rOPINIRole (REQUIP) 1 MG tablet; Take 1 tablet (1 mg total) by mouth at bedtime.  Dispense: 30 tablet;  Refill: 3  5. GAD (generalized anxiety disorder) Will add valium 5mg  which may be taken up to twice daily as needed for acute anxiety. This prescription is to use while travelling only and will not be continued after vacation is over.  - diazepam (VALIUM) 5 MG tablet; Take 1 tablet (5 mg total) by mouth every 12 (twelve) hours as needed for anxiety.  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 New prescription sent to her pharmacy today.  - zolpidem (AMBIEN CR) 12.5 MG CR tablet; Take 1 tablet (12.5 mg total) by mouth at bedtime as needed for sleep.  Dispense: 30 tablet; Refill: 3  General Counseling: Keirstan verbalizes understanding of the findings of todays visit and agrees with plan of treatment. I have discussed any further diagnostic evaluation that may be needed or ordered today. We also reviewed her medications today. she has been encouraged to call the office with any questions or concerns that should arise related to todays visit.  This patient was seen by [place] with Dr Lavera Guise as a part of collaborative care agreement  Meds ordered this encounter  Medications  . diazepam (VALIUM) 5 MG tablet    Sig: Take 1 tablet (5 mg total) by mouth every 12 (twelve) hours as needed for anxiety.    Dispense:  30 tablet    Refill:  1    Order Specific Question:   Supervising Provider    Answer:   Lavera Guise [[id number]]  . gabapentin (NEURONTIN) 300 MG capsule    Sig: Take 1 capsule (300 mg total) by mouth 2 (two) times daily.    Dispense:  60 capsule    Refill:  3    Order Specific Question:   Supervising Provider    Answer:   Lavera Guise [[id number]]  . levothyroxine (SYNTHROID) 200 MCG tablet    Sig: Take 1 tablet (200 mcg total) by mouth daily before breakfast.    Dispense:  30 tablet    Refill:  3    Order Specific Question:   Supervising Provider    Answer:   Lavera Guise [place]  . rOPINIRole (REQUIP) 1 MG tablet    Sig: Take 1 tablet (1 mg total) by mouth at bedtime.    Dispense:  30 tablet    Refill:  3    Order Specific Question:   Supervising Provider    Answer:   Lavera Guise [[id number]]  . tiZANidine (ZANAFLEX) 4 MG tablet    Sig: Take 0.5-1 tablets (2-4 mg total) by mouth 2 (two) times daily as needed for muscle spasms.    Dispense:  60 tablet    Refill:  3    Order Specific Question:   Supervising Provider    Answer:   Lavera Guise [[id number]]  . zolpidem (AMBIEN CR) 12.5 MG CR tablet    Sig: Take 1 tablet (12.5 mg total) by mouth at bedtime as needed for sleep.    Dispense:  30 tablet    Refill:  3    Order Specific Question:   Supervising Provider    Answer:   Lavera Guise [[id number]]    Total time spent: 30 Minutes  Time spent includes review of chart, medications, test results, and follow up plan with the patient.      Dr Lavera Guise Internal medicine

## 2019-11-05 ENCOUNTER — Other Ambulatory Visit: Payer: Self-pay

## 2019-11-20 ENCOUNTER — Other Ambulatory Visit: Payer: Self-pay

## 2019-11-20 DIAGNOSIS — F411 Generalized anxiety disorder: Secondary | ICD-10-CM

## 2019-11-20 MED ORDER — HYDROXYZINE HCL 50 MG PO TABS: 50.0000 mg | ORAL_TABLET | Freq: Three times a day (TID) | ORAL | 1 refills | Status: DC | PRN

## 2019-12-19 ENCOUNTER — Other Ambulatory Visit: Payer: Self-pay

## 2019-12-19 DIAGNOSIS — F411 Generalized anxiety disorder: Secondary | ICD-10-CM

## 2019-12-19 MED ORDER — HYDROXYZINE HCL 50 MG PO TABS: 50.0000 mg | ORAL_TABLET | Freq: Three times a day (TID) | ORAL | 1 refills | Status: DC | PRN

## 2019-12-19 NOTE — Telephone Encounter (Signed)
Pt advised that need to been for appt  pt said that error   by phar to sending [place]

## 2019-12-19 NOTE — Telephone Encounter (Signed)
This was supposed to be for short term only. She needs to be seen for more refills.

## 2020-01-06 ENCOUNTER — Telehealth: Payer: Self-pay

## 2020-01-06 NOTE — Telephone Encounter (Signed)
Confirmed & screened for OV on 9/9

## 2020-01-08 ENCOUNTER — Ambulatory Visit: Payer: Self-pay | Admitting: Nurse Practitioner

## 2020-01-19 ENCOUNTER — Other Ambulatory Visit: Payer: Self-pay

## 2020-01-19 DIAGNOSIS — G2581 Restless legs syndrome: Secondary | ICD-10-CM

## 2020-01-19 DIAGNOSIS — G8929 Other chronic pain: Secondary | ICD-10-CM

## 2020-01-19 MED ORDER — TIZANIDINE HCL 4 MG PO TABS: 2.0000 mg | ORAL_TABLET | Freq: Two times a day (BID) | ORAL | 3 refills | Status: DC | PRN

## 2020-01-19 MED ORDER — ROPINIROLE HCL 1 MG PO TABS: 1.0000 mg | ORAL_TABLET | Freq: Every day | ORAL | 3 refills | Status: DC

## 2020-01-21 ENCOUNTER — Telehealth: Payer: Self-pay

## 2020-01-21 NOTE — Telephone Encounter (Signed)
Confirmed and screened for ov on 9/23

## 2020-01-22 ENCOUNTER — Encounter: Payer: Self-pay | Admitting: Nurse Practitioner

## 2020-01-22 ENCOUNTER — Ambulatory Visit: Payer: Self-pay | Admitting: Nurse Practitioner

## 2020-01-22 ENCOUNTER — Other Ambulatory Visit: Payer: Self-pay

## 2020-01-22 VITALS — BP 125/86 | HR 77 | Temp 97.5°F | Resp 16 | Ht 66.0 in | Wt 218.0 lb

## 2020-01-22 DIAGNOSIS — M792 Neuralgia and neuritis, unspecified: Secondary | ICD-10-CM

## 2020-01-22 DIAGNOSIS — J301 Allergic rhinitis due to pollen: Secondary | ICD-10-CM

## 2020-01-22 DIAGNOSIS — M545 Low back pain, unspecified: Secondary | ICD-10-CM

## 2020-01-22 DIAGNOSIS — E039 Hypothyroidism, unspecified: Secondary | ICD-10-CM

## 2020-01-22 DIAGNOSIS — F409 Phobic anxiety disorder, unspecified: Secondary | ICD-10-CM

## 2020-01-22 DIAGNOSIS — F411 Generalized anxiety disorder: Secondary | ICD-10-CM

## 2020-01-22 DIAGNOSIS — G2581 Restless legs syndrome: Secondary | ICD-10-CM

## 2020-01-22 DIAGNOSIS — G8929 Other chronic pain: Secondary | ICD-10-CM

## 2020-01-22 DIAGNOSIS — F5105 Insomnia due to other mental disorder: Secondary | ICD-10-CM

## 2020-01-22 MED ORDER — LEVOTHYROXINE SODIUM 200 MCG PO TABS: 200.0000 ug | ORAL_TABLET | Freq: Every day | ORAL | 3 refills | Status: DC

## 2020-01-22 MED ORDER — GABAPENTIN 300 MG PO CAPS: 300.0000 mg | ORAL_CAPSULE | Freq: Two times a day (BID) | ORAL | 3 refills | Status: DC

## 2020-01-22 MED ORDER — ZOLPIDEM TARTRATE ER 12.5 MG PO TBCR: 12.5000 mg | EXTENDED_RELEASE_TABLET | Freq: Every evening | ORAL | 3 refills | Status: DC | PRN

## 2020-01-22 MED ORDER — HYDROXYZINE HCL 50 MG PO TABS: 50.0000 mg | ORAL_TABLET | Freq: Three times a day (TID) | ORAL | 3 refills | Status: DC | PRN

## 2020-01-22 MED ORDER — ROPINIROLE HCL 1 MG PO TABS: 1.0000 mg | ORAL_TABLET | Freq: Every day | ORAL | 3 refills | Status: DC

## 2020-01-22 MED ORDER — TIZANIDINE HCL 4 MG PO TABS: 2.0000 mg | ORAL_TABLET | Freq: Two times a day (BID) | ORAL | 3 refills | Status: DC | PRN

## 2020-01-22 MED ORDER — FEXOFENADINE HCL 180 MG PO TABS: 180.0000 mg | ORAL_TABLET | Freq: Every day | ORAL | 5 refills | Status: DC

## 2020-01-22 NOTE — Progress Notes (Signed)
[facility] [place], [place] [address]  Internal MEDICINE  Office Visit Note  Patient Name: Stephanie Larson  [id number]  [id number]  Date of Service: 02/08/2020  Chief Complaint  Patient presents with  . Follow-up  . Depression    The patient is here for routine follow up. She does have Hashimoto's thyroiditis and she is due to have check of thyroid panel. She has long history of generalized anxiety disorder with episodes of panic attacks. She takes hydroxyzine as needed which does help to control these attacks. Because of the generalized anxiety, she has problems with slee, both falling and staying asleep. We have tried several OTC and prescription medications to help with this and she has done the best with Zolpidem CR 12.5mg  tablets. She has consulted with mental health provider in the past, and this continued to be the most effective medication to help her with sleep.       Current Medication: Outpatient Encounter Medications as of 01/22/2020  Medication Sig Note  . chlorhexidine (PERIDEX) 0.12 % solution Use as directed 10 mLs in the mouth or throat 2 (two) times daily.   . fexofenadine (ALLEGRA) 180 MG tablet Take 1 tablet (180 mg total) by mouth daily.   . fluconazole (DIFLUCAN) 150 MG tablet Take 1 tablet po once. May repeat dose in 3 days as needed for persistent symptoms.   Marland Kitchen gabapentin (NEURONTIN) 300 MG capsule Take 1 capsule (300 mg total) by mouth 2 (two) times daily.   . hydrOXYzine (ATARAX/VISTARIL) 50 MG tablet Take 1 tablet (50 mg total) by mouth 3 (three) times daily as needed.   Marland Kitchen levothyroxine (SYNTHROID) 200 MCG tablet Take 1 tablet (200 mcg total) by mouth daily before breakfast.   . mometasone (ELOCON) 0.1 % cream APPLY TO AFFECTED AREAS FROM FACE TO LOWER EXTREMITIES TWICE A DAY TO SPOT TREAT DURING FLARES 06/23/2019: As needed for Eczema  . rOPINIRole (REQUIP) 1 MG tablet Take 1 tablet (1 mg total) by mouth at bedtime.   Marland Kitchen tiZANidine  (ZANAFLEX) 4 MG tablet Take 0.5-1 tablets (2-4 mg total) by mouth 2 (two) times daily as needed for muscle spasms.   Marland Kitchen zolpidem (AMBIEN CR) 12.5 MG CR tablet Take 1 tablet (12.5 mg total) by mouth at bedtime as needed for sleep.   . [DISCONTINUED] diazepam (VALIUM) 5 MG tablet Take 1 tablet (5 mg total) by mouth every 12 (twelve) hours as needed for anxiety. 01/22/2020: short-term prescrition onnly.   . [DISCONTINUED] doxycycline (DORYX) 100 MG EC tablet Take 100 mg by mouth 2 (two) times daily.   . [DISCONTINUED] fexofenadine (ALLEGRA) 180 MG tablet Take 1 tablet (180 mg total) by mouth daily.   . [DISCONTINUED] gabapentin (NEURONTIN) 300 MG capsule Take 1 capsule (300 mg total) by mouth 2 (two) times daily.   . [DISCONTINUED] hydrOXYzine (ATARAX/VISTARIL) 50 MG tablet Take 1 tablet (50 mg total) by mouth 3 (three) times daily as needed.   . [DISCONTINUED] levothyroxine (SYNTHROID) 200 MCG tablet Take 1 tablet (200 mcg total) by mouth daily before breakfast.   . [DISCONTINUED] rOPINIRole (REQUIP) 1 MG tablet Take 1 tablet (1 mg total) by mouth at bedtime.   . [DISCONTINUED] tiZANidine (ZANAFLEX) 4 MG tablet Take 0.5-1 tablets (2-4 mg total) by mouth 2 (two) times daily as needed for muscle spasms.   . [DISCONTINUED] zolpidem (AMBIEN CR) 12.5 MG CR tablet Take 1 tablet (12.5 mg total) by mouth at bedtime as needed for sleep.    No  facility-administered encounter medications on file as of 01/22/2020.    Surgical History: Past Surgical History:  Procedure Laterality Date  . CYSTOSCOPY N/A 05/13/2019   Procedure: CYSTOSCOPY;  Surgeon: Will Bonnet, MD;  Location: [facility] ORS;  Service: Gynecology;  Laterality: N/A;  . LAPAROSCOPIC GASTRIC BANDING    . TOTAL LAPAROSCOPIC HYSTERECTOMY WITH SALPINGECTOMY N/A 05/13/2019   Procedure: TOTAL LAPAROSCOPIC HYSTERECTOMY WITH BILATERAL SALPINGECTOMY;  Surgeon: Will Bonnet, MD;  Location: [facility] ORS;  Service: Gynecology;  Laterality: N/A;  . TUBAL  LIGATION      Medical History: Past Medical History:  Diagnosis Date  . Anxiety   . Depression   . Family history of ovarian cancer    qualifies for cancer genetic testing  . Hypothyroidism   . Insomnia   . Memory changes   . Sinus drainage   . Sleep apnea   . Thyroid disease     Family History: Family History  Problem Relation Age of Onset  . Colon cancer Paternal Grandfather   . Ovarian cancer Paternal Grandmother 31  . Hypertension Maternal Grandmother   . Hypertension Father   . Emphysema Father   . ALS Mother   . Hypertension Mother     Social History   Socioeconomic History  . Marital status: Married    Spouse name: Not on file  . Number of children: Not on file  . Years of education: Not on file  . Highest education level: Not on file  Occupational History  . Not on file  Tobacco Use  . Smoking status: Current Every Day Smoker    Packs/day: 0.50    Types: Cigarettes  . Smokeless tobacco: Never Used  Vaping Use  . Vaping Use: Never used  Substance and Sexual Activity  . Alcohol use: Not Currently    Comment: occasionally  . Drug use: Never  . Sexual activity: Yes    Birth control/protection: None  Other Topics Concern  . Not on file  Social History Narrative  . Not on file   Social Determinants of Health   Financial Resource Strain:   . Difficulty of Paying Living Expenses: Not on file  Food Insecurity:   . Worried About Charity fundraiser in the Last Year: Not on file  . Ran Out of Food in the Last Year: Not on file  Transportation Needs:   . Lack of Transportation (Medical): Not on file  . Lack of Transportation (Non-Medical): Not on file  Physical Activity:   . Days of Exercise per Week: Not on file  . Minutes of Exercise per Session: Not on file  Stress:   . Feeling of Stress : Not on file  Social Connections:   . Frequency of Communication with Friends and Family: Not on file  . Frequency of Social Gatherings with Friends and  Family: Not on file  . Attends Religious Services: Not on file  . Active Member of Clubs or Organizations: Not on file  . Attends Archivist Meetings: Not on file  . Marital Status: Not on file  Intimate Partner Violence:   . Fear of Current or Ex-Partner: Not on file  . Emotionally Abused: Not on file  . Physically Abused: Not on file  . Sexually Abused: Not on file      Review of Systems  Constitutional: Positive for fatigue. Negative for activity change, chills and unexpected weight change.  HENT: Negative for congestion, postnasal drip, rhinorrhea, sneezing and sore throat.   Respiratory: Negative  for cough, chest tightness, shortness of breath and wheezing.   Cardiovascular: Negative for chest pain, palpitations and leg swelling.  Gastrointestinal: Negative for abdominal pain, constipation, diarrhea, nausea and vomiting.  Endocrine: Negative for cold intolerance, heat intolerance, polydipsia and polyuria.       History of Hashimoto's thyroiditis. Due to have thyroid panel checked.   Musculoskeletal: Positive for arthralgias, back pain and myalgias. Negative for joint swelling and neck pain.       Chronic lower back pain with radiation into the hips and legs.   Skin: Negative for rash.  Allergic/Immunologic: Positive for environmental allergies.  Neurological: Negative for dizziness, tremors, numbness and headaches.       Does have neuropathy in lower legs and feet.   Hematological: Negative for adenopathy. Does not bruise/bleed easily.  Psychiatric/Behavioral: Positive for dysphoric mood. Negative for behavioral problems (Depression), sleep disturbance and suicidal ideas. The patient is nervous/anxious.     Today's Vitals   01/22/20 1607  BP: 125/86  Pulse: 77  Resp: 16  Temp: (!) 97.5 F (36.4 C)  SpO2: 97%  Weight: 218 lb (98.9 kg)  Height: 5\' 6"  (1.676 m)   Body mass index is 35.19 kg/m.  Physical Exam Vitals and nursing note reviewed.   Constitutional:      General: She is not in acute distress.    Appearance: Normal appearance. She is well-developed. She is obese. She is not diaphoretic.  HENT:     Head: Normocephalic and atraumatic.     Nose: Congestion present.     Mouth/Throat:     Pharynx: No oropharyngeal exudate.  Eyes:     Conjunctiva/sclera: Conjunctivae normal.     Pupils: Pupils are equal, round, and reactive to light.  Neck:     Thyroid: No thyromegaly.     Vascular: No carotid bruit or JVD.     Trachea: No tracheal deviation.  Cardiovascular:     Rate and Rhythm: Normal rate and regular rhythm.     Heart sounds: Normal heart sounds. No murmur heard.  No friction rub. No gallop.   Pulmonary:     Effort: Pulmonary effort is normal. No respiratory distress.     Breath sounds: Normal breath sounds. No wheezing or rales.  Chest:     Chest wall: No tenderness.  Abdominal:     Palpations: Abdomen is soft.  Musculoskeletal:        General: Normal range of motion.     Cervical back: Normal range of motion and neck supple.     Comments: Muscle pain in lower back which is worse with exertion, bending, and twisting at the waist. There are no palpable abnormalities or deformities noted at this time.   Lymphadenopathy:     Cervical: No cervical adenopathy.  Skin:    General: Skin is warm and dry.  Neurological:     General: No focal deficit present.     Mental Status: She is alert and oriented to person, place, and time.     Cranial Nerves: No cranial nerve deficit.  Psychiatric:        Attention and Perception: Attention and perception normal.        Mood and Affect: Mood is anxious and depressed.        Speech: Speech normal.        Behavior: Behavior normal. Behavior is cooperative.        Thought Content: Thought content normal.        Cognition and Memory: Cognition and  memory normal.        Judgment: Judgment normal.    Assessment/Plan: 1. Hypothyroidism, unspecified type Check thyroid panel  and adjust dosing of levothyroxine as indicated. - levothyroxine (SYNTHROID) 200 MCG tablet; Take 1 tablet (200 mcg total) by mouth daily before breakfast.  Dispense: 30 tablet; Refill: 3  2. Seasonal allergic rhinitis due to pollen Take allegra daily.  - fexofenadine (ALLEGRA) 180 MG tablet; Take 1 tablet (180 mg total) by mouth daily.  Dispense: 30 tablet; Refill: 5  3. Neuralgia and neuritis, unspecified May take gabapentin twice daily as prescribed  - gabapentin (NEURONTIN) 300 MG capsule; Take 1 capsule (300 mg total) by mouth 2 (two) times daily.  Dispense: 60 capsule; Refill: 3  4. GAD (generalized anxiety disorder) hydroxizine 50mg  should be taken up to three times daily as needed for acute anxiety.  - hydrOXYzine (ATARAX/VISTARIL) 50 MG tablet; Take 1 tablet (50 mg total) by mouth 3 (three) times daily as needed.  Dispense: 90 tablet; Refill: 3  5. Restless leg syndrome Well managed. Continue requip 1mg  every evening. - rOPINIRole (REQUIP) 1 MG tablet; Take 1 tablet (1 mg total) by mouth at bedtime.  Dispense: 30 tablet; Refill: 3  6. Chronic midline low back pain without sciatica May take tizanidine as needed and as prescribed for muscle pain/tightness.  - tiZANidine (ZANAFLEX) 4 MG tablet; Take 0.5-1 tablets (2-4 mg total) by mouth 2 (two) times daily as needed for muscle spasms.  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 New prescription sent to her pharmacy.  - zolpidem (AMBIEN CR) 12.5 MG CR tablet; Take 1 tablet (12.5 mg total) by mouth at bedtime as needed for sleep.  Dispense: 30 tablet; Refill: 3  General Counseling: Keelyn verbalizes understanding of the findings of todays visit and agrees with plan of treatment. I have discussed any further diagnostic evaluation that may be needed or ordered today. We also reviewed her medications today. she has been encouraged to call the office with any questions  or concerns that should arise related to todays visit.   This patient was seen by [place] with Dr Lavera Guise as a part of collaborative care agreement  Meds ordered this encounter  Medications  . fexofenadine (ALLEGRA) 180 MG tablet    Sig: Take 1 tablet (180 mg total) by mouth daily.    Dispense:  30 tablet    Refill:  5    Order Specific Question:   Supervising Provider    Answer:   Lavera Guise [[id number]]  . gabapentin (NEURONTIN) 300 MG capsule    Sig: Take 1 capsule (300 mg total) by mouth 2 (two) times daily.    Dispense:  60 capsule    Refill:  3    Order Specific Question:   Supervising Provider    Answer:   Lavera Guise [[id number]]  . hydrOXYzine (ATARAX/VISTARIL) 50 MG tablet    Sig: Take 1 tablet (50 mg total) by mouth 3 (three) times daily as needed.    Dispense:  90 tablet    Refill:  3    Order Specific Question:   Supervising Provider    Answer:   Lavera Guise [[id number]]  . levothyroxine (SYNTHROID) 200 MCG tablet    Sig: Take 1 tablet (200 mcg total) by mouth daily before breakfast.    Dispense:  30 tablet    Refill:  3  Order Specific Question:   Supervising Provider    Answer:   Lavera Guise [[id number]]  . rOPINIRole (REQUIP) 1 MG tablet    Sig: Take 1 tablet (1 mg total) by mouth at bedtime.    Dispense:  30 tablet    Refill:  3    Order Specific Question:   Supervising Provider    Answer:   Lavera Guise [[id number]]  . tiZANidine (ZANAFLEX) 4 MG tablet    Sig: Take 0.5-1 tablets (2-4 mg total) by mouth 2 (two) times daily as needed for muscle spasms.    Dispense:  60 tablet    Refill:  3    Order Specific Question:   Supervising Provider    Answer:   Lavera Guise [[id number]]  . zolpidem (AMBIEN CR) 12.5 MG CR tablet    Sig: Take 1 tablet (12.5 mg total) by mouth at bedtime as needed for sleep.    Dispense:  30 tablet    Refill:  3    Order Specific Question:   Supervising Provider    Answer:   Lavera Guise [[id number]]    Total time spent: 30  Minutes   Time spent includes review of chart, medications, test results, and follow up plan with the patient.      Dr Lavera Guise Internal medicine

## 2020-05-04 ENCOUNTER — Other Ambulatory Visit: Payer: Self-pay | Admitting: Nurse Practitioner

## 2020-05-04 ENCOUNTER — Telehealth: Payer: Self-pay

## 2020-05-04 ENCOUNTER — Other Ambulatory Visit: Payer: Self-pay

## 2020-05-04 DIAGNOSIS — F5105 Insomnia due to other mental disorder: Secondary | ICD-10-CM

## 2020-05-04 DIAGNOSIS — F411 Generalized anxiety disorder: Secondary | ICD-10-CM

## 2020-05-04 DIAGNOSIS — E039 Hypothyroidism, unspecified: Secondary | ICD-10-CM

## 2020-05-04 DIAGNOSIS — J301 Allergic rhinitis due to pollen: Secondary | ICD-10-CM

## 2020-05-04 DIAGNOSIS — F409 Phobic anxiety disorder, unspecified: Secondary | ICD-10-CM

## 2020-05-04 DIAGNOSIS — G8929 Other chronic pain: Secondary | ICD-10-CM

## 2020-05-04 DIAGNOSIS — G2581 Restless legs syndrome: Secondary | ICD-10-CM

## 2020-05-04 DIAGNOSIS — M792 Neuralgia and neuritis, unspecified: Secondary | ICD-10-CM

## 2020-05-04 MED ORDER — LEVOTHYROXINE SODIUM 200 MCG PO TABS: 200.0000 ug | ORAL_TABLET | Freq: Every day | ORAL | 0 refills | Status: DC

## 2020-05-04 MED ORDER — TIZANIDINE HCL 4 MG PO TABS: 2.0000 mg | ORAL_TABLET | Freq: Two times a day (BID) | ORAL | 0 refills | Status: DC | PRN

## 2020-05-04 MED ORDER — GABAPENTIN 300 MG PO CAPS: 300.0000 mg | ORAL_CAPSULE | Freq: Two times a day (BID) | ORAL | 0 refills | Status: DC

## 2020-05-04 MED ORDER — HYDROXYZINE HCL 50 MG PO TABS: 50.0000 mg | ORAL_TABLET | Freq: Three times a day (TID) | ORAL | 0 refills | Status: DC | PRN

## 2020-05-04 MED ORDER — ZOLPIDEM TARTRATE ER 12.5 MG PO TBCR: 12.5000 mg | EXTENDED_RELEASE_TABLET | Freq: Every evening | ORAL | 0 refills | Status: DC | PRN

## 2020-05-04 MED ORDER — ROPINIROLE HCL 1 MG PO TABS: 1.0000 mg | ORAL_TABLET | Freq: Every day | ORAL | 0 refills | Status: DC

## 2020-05-04 MED ORDER — FEXOFENADINE HCL 180 MG PO TABS: 180.0000 mg | ORAL_TABLET | Freq: Every day | ORAL | 0 refills | Status: DC

## 2020-05-04 NOTE — Telephone Encounter (Signed)
I refilled the ambien for her.

## 2020-05-25 ENCOUNTER — Ambulatory Visit: Payer: Self-pay | Admitting: Internal Medicine

## 2020-05-27 ENCOUNTER — Encounter: Payer: Self-pay | Admitting: Physician Assistant

## 2020-05-27 ENCOUNTER — Other Ambulatory Visit: Payer: Self-pay | Admitting: Hospice and Palliative Medicine

## 2020-05-27 ENCOUNTER — Ambulatory Visit (INDEPENDENT_AMBULATORY_CARE_PROVIDER_SITE_OTHER): Payer: Self-pay | Admitting: Physician Assistant

## 2020-05-27 VITALS — BP 116/85 | HR 81 | Temp 97.6°F | Resp 16 | Ht 66.0 in

## 2020-05-27 DIAGNOSIS — F411 Generalized anxiety disorder: Secondary | ICD-10-CM

## 2020-05-27 DIAGNOSIS — M792 Neuralgia and neuritis, unspecified: Secondary | ICD-10-CM

## 2020-05-27 DIAGNOSIS — M545 Low back pain, unspecified: Secondary | ICD-10-CM

## 2020-05-27 DIAGNOSIS — F409 Phobic anxiety disorder, unspecified: Secondary | ICD-10-CM

## 2020-05-27 DIAGNOSIS — F5105 Insomnia due to other mental disorder: Secondary | ICD-10-CM

## 2020-05-27 DIAGNOSIS — Z0189 Encounter for other specified special examinations: Secondary | ICD-10-CM

## 2020-05-27 DIAGNOSIS — G2581 Restless legs syndrome: Secondary | ICD-10-CM

## 2020-05-27 DIAGNOSIS — G8929 Other chronic pain: Secondary | ICD-10-CM

## 2020-05-27 DIAGNOSIS — E039 Hypothyroidism, unspecified: Secondary | ICD-10-CM

## 2020-05-27 NOTE — Progress Notes (Signed)
[facility] [place], [place] [address]  Internal MEDICINE  Office Visit Note  Patient Name: Stephanie Larson  [id number]  [id number]  Date of Service: 06/02/2020  Chief Complaint  Patient presents with  . Follow-up  . Depression    HPI  Pt presents today for a 4 month f/u. She has a long history of anxiety affecting her sleep. She takes hydroxyzine as needed and has found that she sleeps well with Ambien nightly, but absolutely does not think she could go without it. She had a sleep study in 2018 and uses CPAP every night. She also has a history of Hashimoto's thyroiditis and was seen by endocrinologist several months ago. She states she disagreed with them over her treatment plan and did not continue to follow up. She takes requip only as needed for RLS and continues to take gabapentin for her neuralgia. She continues zanaflex 1.5 tabs per day from chronic low back pain. She reports she did see a psychiatrist but thinks it was back in 2018. She also states she is going to be having dental work done soon and discussed having a physical with lab work prior to then.   Current Medication: Outpatient Encounter Medications as of 05/27/2020  Medication Sig Note  . chlorhexidine (PERIDEX) 0.12 % solution Use as directed 10 mLs in the mouth or throat 2 (two) times daily.   . fexofenadine (ALLEGRA) 180 MG tablet Take 1 tablet (180 mg total) by mouth daily.   . fluconazole (DIFLUCAN) 150 MG tablet Take 1 tablet po once. May repeat dose in 3 days as needed for persistent symptoms.   Marland Kitchen gabapentin (NEURONTIN) 300 MG capsule Take 1 capsule (300 mg total) by mouth 2 (two) times daily.   . hydrOXYzine (ATARAX/VISTARIL) 50 MG tablet Take 1 tablet (50 mg total) by mouth 3 (three) times daily as needed.   Marland Kitchen levothyroxine (SYNTHROID) 200 MCG tablet Take 1 tablet (200 mcg total) by mouth daily before breakfast.   . mometasone (ELOCON) 0.1 % cream APPLY TO AFFECTED AREAS FROM FACE TO LOWER  EXTREMITIES TWICE A DAY TO SPOT TREAT DURING FLARES 06/23/2019: As needed for Eczema  . rOPINIRole (REQUIP) 1 MG tablet Take 1 tablet (1 mg total) by mouth at bedtime.   Marland Kitchen tiZANidine (ZANAFLEX) 4 MG tablet Take 0.5-1 tablets (2-4 mg total) by mouth 2 (two) times daily as needed for muscle spasms.   Marland Kitchen zolpidem (AMBIEN CR) 12.5 MG CR tablet Take 1 tablet (12.5 mg total) by mouth at bedtime as needed for sleep.    No facility-administered encounter medications on file as of 05/27/2020.    Surgical History: Past Surgical History:  Procedure Laterality Date  . CYSTOSCOPY N/A 05/13/2019   Procedure: CYSTOSCOPY;  Surgeon: Will Bonnet, MD;  Location: [facility] ORS;  Service: Gynecology;  Laterality: N/A;  . LAPAROSCOPIC GASTRIC BANDING    . TOTAL LAPAROSCOPIC HYSTERECTOMY WITH SALPINGECTOMY N/A 05/13/2019   Procedure: TOTAL LAPAROSCOPIC HYSTERECTOMY WITH BILATERAL SALPINGECTOMY;  Surgeon: Will Bonnet, MD;  Location: [facility] ORS;  Service: Gynecology;  Laterality: N/A;  . TUBAL LIGATION      Medical History: Past Medical History:  Diagnosis Date  . Anxiety   . Depression   . Family history of ovarian cancer    qualifies for cancer genetic testing  . Hypothyroidism   . Insomnia   . Memory changes   . Sinus drainage   . Sleep apnea   . Thyroid disease     Family  History: Family History  Problem Relation Age of Onset  . Colon cancer Paternal Grandfather   . Ovarian cancer Paternal Grandmother 84  . Hypertension Maternal Grandmother   . Hypertension Father   . Emphysema Father   . ALS Mother   . Hypertension Mother     Social History   Socioeconomic History  . Marital status: Married    Spouse name: Not on file  . Number of children: Not on file  . Years of education: Not on file  . Highest education level: Not on file  Occupational History  . Not on file  Tobacco Use  . Smoking status: Current Every Day Smoker    Packs/day: 0.50    Types: Cigarettes  . Smokeless  tobacco: Never Used  Vaping Use  . Vaping Use: Never used  Substance and Sexual Activity  . Alcohol use: Not Currently    Comment: occasionally  . Drug use: Never  . Sexual activity: Yes    Birth control/protection: None  Other Topics Concern  . Not on file  Social History Narrative  . Not on file   Social Determinants of Health   Financial Resource Strain: Not on file  Food Insecurity: Not on file  Transportation Needs: Not on file  Physical Activity: Not on file  Stress: Not on file  Social Connections: Not on file  Intimate Partner Violence: Not on file      Review of Systems  Constitutional: Negative for chills, fatigue and unexpected weight change.  HENT: Negative for congestion, postnasal drip, rhinorrhea, sneezing and sore throat.   Eyes: Negative for redness.  Respiratory: Negative for cough, chest tightness and shortness of breath.   Cardiovascular: Negative for chest pain and palpitations.  Gastrointestinal: Negative for abdominal pain, constipation, diarrhea, nausea and vomiting.  Genitourinary: Negative for dysuria and frequency.  Musculoskeletal: Positive for arthralgias, back pain and myalgias. Negative for joint swelling and neck pain.  Skin: Negative for rash.  Neurological: Negative for tremors and numbness.       Neuropathy in hands and legs  Hematological: Negative for adenopathy. Does not bruise/bleed easily.  Psychiatric/Behavioral: Positive for dysphoric mood and sleep disturbance. Negative for behavioral problems (Depression) and suicidal ideas. The patient is nervous/anxious.     Vital Signs: BP 116/85   Pulse 81   Temp 97.6 F (36.4 C)   Resp 16   Ht 5\' 6"  (1.676 m)   LMP 04/18/2019 (Exact Date)   SpO2 98%   BMI 35.19 kg/m    Physical Exam Constitutional:      General: She is not in acute distress.    Appearance: She is well-developed. She is not diaphoretic.  HENT:     Head: Normocephalic and atraumatic.     Mouth/Throat:      Pharynx: No oropharyngeal exudate.  Eyes:     Pupils: Pupils are equal, round, and reactive to light.  Neck:     Thyroid: No thyromegaly.     Vascular: No JVD.     Trachea: No tracheal deviation.  Cardiovascular:     Rate and Rhythm: Normal rate and regular rhythm.     Heart sounds: Normal heart sounds. No murmur heard. No friction rub. No gallop.   Pulmonary:     Effort: Pulmonary effort is normal. No respiratory distress.     Breath sounds: No wheezing or rales.  Chest:     Chest wall: No tenderness.  Abdominal:     General: Bowel sounds are normal.  Palpations: Abdomen is soft.  Musculoskeletal:        General: Normal range of motion.     Cervical back: Normal range of motion and neck supple.     Comments: Chronic pain in lower back exacerbated by bending and twisting  Lymphadenopathy:     Cervical: No cervical adenopathy.  Skin:    General: Skin is warm and dry.  Neurological:     Mental Status: She is alert and oriented to person, place, and time.     Cranial Nerves: No cranial nerve deficit.  Psychiatric:        Behavior: Behavior normal.        Thought Content: Thought content normal.     Comments: Mood is anxious and depressed        Assessment/Plan: 1. Encounter for laboratory test Labs to be done prior to upcoming physical. Pt has not had a physical in awhile and has upcoming dental work. - Comprehensive metabolic panel - T4, free - TSH - Lipid Panel With LDL/HDL Ratio - CBC with Differential/Platelet  2. Hypothyroidism, unspecified type Previously went to endocrinology, but she disagreed with their plan. From their record, on 12/17/19 TSH was low at .02, free T4 was 1.07, and TPO ab elevated at 58. Will recheck labs and adjust synthroid as necessary. 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 She does not feel she can go without it.  4. GAD (generalized anxiety disorder) Continues to take  hydroxyzine.  5. Neuralgia and neuritis, unspecified Continues to take gabapentin.  6. Restless leg syndrome Continues to take requip as needed.  7. Chronic midline low back pain without sciatica Continues to take 1.5 tabs of Zanaflex daily. She does not think she can decrease at this time.  General Counseling: Journi verbalizes understanding of the findings of todays visit and agrees with plan of treatment. I have discussed any further diagnostic evaluation that may be needed or ordered today. We also reviewed her medications today. she has been encouraged to call the office with any questions or concerns that should arise related to todays visit.    Orders Placed This Encounter  Procedures  . TSH + free T4  . CBC with Differential/Platelet  . Lipid Panel With LDL/HDL Ratio  . TSH  . T4, free  . Comprehensive metabolic panel      Total time spent: 30 Minutes Time spent includes review of chart, medications, test results, and follow up plan with the patient.      Dr Lavera Guise Internal medicine

## 2020-06-01 LAB — COMPREHENSIVE METABOLIC PANEL
ALT: 27 IU/L (ref 0–32)
AST: 26 IU/L (ref 0–40)
Albumin/Globulin Ratio: 1.7 (ref 1.2–2.2)
Albumin: 4.3 g/dL (ref 3.8–4.8)
Alkaline Phosphatase: 95 IU/L (ref 44–121)
BUN/Creatinine Ratio: 18 (ref 9–23)
BUN: 16 mg/dL (ref 6–24)
Bilirubin Total: 0.3 mg/dL (ref 0.0–1.2)
CO2: 21 mmol/L (ref 20–29)
Calcium: 9.8 mg/dL (ref 8.7–10.2)
Chloride: 106 mmol/L (ref 96–106)
Creatinine, Ser: 0.9 mg/dL (ref 0.57–1.00)
GFR calc Af Amer: 88 mL/min/{1.73_m2} (ref >59)
GFR calc non Af Amer: 76 mL/min/{1.73_m2} (ref >59)
Globulin, Total: 2.6 g/dL (ref 1.5–4.5)
Glucose: 119 mg/dL — ABNORMAL HIGH (ref 65–99)
Potassium: 4 mmol/L (ref 3.5–5.2)
Sodium: 143 mmol/L (ref 134–144)
Total Protein: 6.9 g/dL (ref 6.0–8.5)

## 2020-06-01 LAB — LIPID PANEL WITH LDL/HDL RATIO
Cholesterol, Total: 284 mg/dL — ABNORMAL HIGH (ref 100–199)
HDL: 55 mg/dL (ref >39)
LDL Chol Calc (NIH): 195 mg/dL — ABNORMAL HIGH (ref 0–99)
LDL/HDL Ratio: 3.5 ratio — ABNORMAL HIGH (ref 0.0–3.2)
Triglycerides: 179 mg/dL — ABNORMAL HIGH (ref 0–149)
VLDL Cholesterol Cal: 34 mg/dL (ref 5–40)

## 2020-06-01 LAB — CBC WITH DIFFERENTIAL/PLATELET
Basophils Absolute: 0.1 10*3/uL (ref 0.0–0.2)
Basos: 1 %
EOS (ABSOLUTE): 0 10*3/uL (ref 0.0–0.4)
Eos: 0 %
Hematocrit: 40.4 % (ref 34.0–46.6)
Hemoglobin: 13.8 g/dL (ref 11.1–15.9)
Immature Grans (Abs): 0 10*3/uL (ref 0.0–0.1)
Immature Granulocytes: 0 %
Lymphocytes Absolute: 2.3 10*3/uL (ref 0.7–3.1)
Lymphs: 22 %
MCH: 28.8 pg (ref 26.6–33.0)
MCHC: 34.2 g/dL (ref 31.5–35.7)
MCV: 84 fL (ref 79–97)
Monocytes Absolute: 0.7 10*3/uL (ref 0.1–0.9)
Monocytes: 6 %
Neutrophils Absolute: 7.6 10*3/uL — ABNORMAL HIGH (ref 1.4–7.0)
Neutrophils: 71 %
Platelets: 479 10*3/uL — ABNORMAL HIGH (ref 150–450)
RBC: 4.8 x10E6/uL (ref 3.77–5.28)
RDW: 12.3 % (ref 11.7–15.4)
WBC: 10.7 10*3/uL (ref 3.4–10.8)

## 2020-06-01 LAB — TSH: TSH: 0.065 u[IU]/mL — ABNORMAL LOW (ref 0.450–4.500)

## 2020-06-01 LAB — T4, FREE: Free T4: 1.44 ng/dL (ref 0.82–1.77)

## 2020-06-07 ENCOUNTER — Encounter: Payer: Self-pay | Admitting: Physician Assistant

## 2020-06-07 ENCOUNTER — Ambulatory Visit (INDEPENDENT_AMBULATORY_CARE_PROVIDER_SITE_OTHER): Payer: Self-pay | Admitting: Physician Assistant

## 2020-06-07 DIAGNOSIS — F409 Phobic anxiety disorder, unspecified: Secondary | ICD-10-CM

## 2020-06-07 DIAGNOSIS — G8929 Other chronic pain: Secondary | ICD-10-CM

## 2020-06-07 DIAGNOSIS — R030 Elevated blood-pressure reading, without diagnosis of hypertension: Secondary | ICD-10-CM

## 2020-06-07 DIAGNOSIS — G2581 Restless legs syndrome: Secondary | ICD-10-CM

## 2020-06-07 DIAGNOSIS — Z0001 Encounter for general adult medical examination with abnormal findings: Secondary | ICD-10-CM

## 2020-06-07 DIAGNOSIS — E039 Hypothyroidism, unspecified: Secondary | ICD-10-CM

## 2020-06-07 DIAGNOSIS — E782 Mixed hyperlipidemia: Secondary | ICD-10-CM

## 2020-06-07 DIAGNOSIS — F5105 Insomnia due to other mental disorder: Secondary | ICD-10-CM

## 2020-06-07 DIAGNOSIS — F411 Generalized anxiety disorder: Secondary | ICD-10-CM

## 2020-06-07 DIAGNOSIS — R300 Dysuria: Secondary | ICD-10-CM

## 2020-06-07 DIAGNOSIS — M545 Low back pain, unspecified: Secondary | ICD-10-CM

## 2020-06-07 DIAGNOSIS — Z09 Encounter for follow-up examination after completed treatment for conditions other than malignant neoplasm: Secondary | ICD-10-CM

## 2020-06-07 DIAGNOSIS — M792 Neuralgia and neuritis, unspecified: Secondary | ICD-10-CM

## 2020-06-07 MED ORDER — ATORVASTATIN CALCIUM 10 MG PO TABS: 10.0000 mg | ORAL_TABLET | Freq: Every day | ORAL | 2 refills | Status: DC

## 2020-06-07 NOTE — Progress Notes (Addendum)
[facility] [place], [place] [address]  Internal MEDICINE  Office Visit Note  Patient Name: Stephanie Larson  [id number]  [id number]  Date of Service: 06/07/2020  Chief Complaint  Patient presents with  . Annual Exam  . Depression     HPI Pt is here for routine health maintenance examination. She has no complaints today, but does have paperwork requesting this record be sent to support medical clearance for an upcoming dental surgery requiring anesthesia. -She is due for mammogram, however she is refusing at this time. She is also eligible for colonoscopy screening, but she is not willing to do this now either. She is open to discussing the colonoscopy in the future.  -She took decongestant med before visit, unsure of the name, but may be why her BP is elevated. Recheck in the room improved to 142/88. She will closely monitor at home. She is aware that it needs to be controlled for surgery and will have to follow up if not returning to baseline. She has no hx of HTN and BP in office on 1/27 was 118/85. -Depression and anxiety are well controlled at this time.  Current Medication: Outpatient Encounter Medications as of 06/07/2020  Medication Sig Note  . atorvastatin (LIPITOR) 10 MG tablet Take 1 tablet (10 mg total) by mouth daily.   . chlorhexidine (PERIDEX) 0.12 % solution Use as directed 10 mLs in the mouth or throat 2 (two) times daily.   . fexofenadine (ALLEGRA) 180 MG tablet Take 1 tablet (180 mg total) by mouth daily.   . fluconazole (DIFLUCAN) 150 MG tablet Take 1 tablet po once. May repeat dose in 3 days as needed for persistent symptoms.   Marland Kitchen gabapentin (NEURONTIN) 300 MG capsule Take 1 capsule (300 mg total) by mouth 2 (two) times daily.   . hydrOXYzine (ATARAX/VISTARIL) 50 MG tablet Take 1 tablet (50 mg total) by mouth 3 (three) times daily as needed.   Marland Kitchen levothyroxine (SYNTHROID) 200 MCG tablet Take 1 tablet (200 mcg total) by mouth daily before  breakfast.   . mometasone (ELOCON) 0.1 % cream APPLY TO AFFECTED AREAS FROM FACE TO LOWER EXTREMITIES TWICE A DAY TO SPOT TREAT DURING FLARES 06/23/2019: As needed for Eczema  . rOPINIRole (REQUIP) 1 MG tablet Take 1 tablet (1 mg total) by mouth at bedtime.   Marland Kitchen tiZANidine (ZANAFLEX) 4 MG tablet Take 0.5-1 tablets (2-4 mg total) by mouth 2 (two) times daily as needed for muscle spasms.   Marland Kitchen zolpidem (AMBIEN CR) 12.5 MG CR tablet Take 1 tablet (12.5 mg total) by mouth at bedtime as needed for sleep.    No facility-administered encounter medications on file as of 06/07/2020.    Surgical History: Past Surgical History:  Procedure Laterality Date  . CYSTOSCOPY N/A 05/13/2019   Procedure: CYSTOSCOPY;  Surgeon: Will Bonnet, MD;  Location: [facility] ORS;  Service: Gynecology;  Laterality: N/A;  . LAPAROSCOPIC GASTRIC BANDING    . TOTAL LAPAROSCOPIC HYSTERECTOMY WITH SALPINGECTOMY N/A 05/13/2019   Procedure: TOTAL LAPAROSCOPIC HYSTERECTOMY WITH BILATERAL SALPINGECTOMY;  Surgeon: Will Bonnet, MD;  Location: [facility] ORS;  Service: Gynecology;  Laterality: N/A;  . TUBAL LIGATION      Medical History: Past Medical History:  Diagnosis Date  . Anxiety   . Depression   . Family history of ovarian cancer    qualifies for cancer genetic testing  . Hypothyroidism   . Insomnia   . Memory changes   . Sinus drainage   .  Sleep apnea   . Thyroid disease     Family History: Family History  Problem Relation Age of Onset  . Colon cancer Paternal Grandfather   . Ovarian cancer Paternal Grandmother 13  . Hypertension Maternal Grandmother   . Hypertension Father   . Emphysema Father   . ALS Mother   . Hypertension Mother       Review of Systems  Constitutional: Negative for chills, fatigue and unexpected weight change.  HENT: Positive for postnasal drip. Negative for congestion, rhinorrhea, sneezing and sore throat.   Eyes: Negative for redness.  Respiratory: Negative for cough, chest  tightness and shortness of breath.   Cardiovascular: Negative for chest pain and palpitations.  Gastrointestinal: Negative for abdominal pain, constipation, diarrhea, nausea and vomiting.  Genitourinary: Negative for dysuria and frequency.  Musculoskeletal: Positive for back pain. Negative for arthralgias, joint swelling and neck pain.       Chronic back pain  Skin: Negative for rash.  Neurological: Negative.  Negative for tremors and numbness.  Hematological: Negative for adenopathy. Does not bruise/bleed easily.  Psychiatric/Behavioral: Negative for sleep disturbance and suicidal ideas. Behavioral problem: Depression. The patient is nervous/anxious.      Vital Signs: BP (!) 152/86   Pulse 71   Temp 97.6 F (36.4 C)   Resp 16   Ht 5' 6"  (1.676 m)   Wt 216 lb 3.2 oz (98.1 kg)   LMP 04/18/2019 (Exact Date)   SpO2 97%   BMI 34.90 kg/m    Physical Exam Constitutional:      General: She is not in acute distress.    Appearance: She is well-developed. She is obese. She is not diaphoretic.  HENT:     Head: Normocephalic and atraumatic.     Right Ear: External ear normal.     Left Ear: External ear normal.     Nose: Nose normal.     Mouth/Throat:     Pharynx: No oropharyngeal exudate.  Eyes:     General: No scleral icterus.       Right eye: No discharge.        Left eye: No discharge.     Conjunctiva/sclera: Conjunctivae normal.     Pupils: Pupils are equal, round, and reactive to light.  Neck:     Thyroid: No thyromegaly.     Vascular: No JVD.     Trachea: No tracheal deviation.  Cardiovascular:     Rate and Rhythm: Normal rate and regular rhythm.     Heart sounds: Normal heart sounds. No murmur heard. No friction rub. No gallop.   Pulmonary:     Effort: Pulmonary effort is normal. No respiratory distress.     Breath sounds: Normal breath sounds. No stridor. No wheezing or rales.  Chest:     Chest wall: No tenderness.  Abdominal:     General: Bowel sounds are  normal. There is no distension.     Palpations: Abdomen is soft. There is no mass.     Tenderness: There is no abdominal tenderness. There is no guarding or rebound.  Musculoskeletal:        General: No tenderness or deformity.     Cervical back: Normal range of motion and neck supple.     Comments: Back pain when bending, twisting limiting ROM  Lymphadenopathy:     Cervical: No cervical adenopathy.  Skin:    General: Skin is warm and dry.     Coloration: Skin is not pale.     Findings:  No erythema or rash.  Neurological:     General: No focal deficit present.     Mental Status: She is alert.     Cranial Nerves: No cranial nerve deficit.     Motor: No abnormal muscle tone.     Coordination: Coordination normal.     Deep Tendon Reflexes: Reflexes are normal and symmetric.  Psychiatric:        Thought Content: Thought content normal.        Judgment: Judgment normal.      LABS: Recent Results (from the past 2160 hour(s))  CBC with Differential/Platelet     Status: Abnormal   Collection Time: 05/31/20  5:00 PM  Result Value Ref Range   WBC 10.7 3.4 - 10.8 x10E3/uL   RBC 4.80 3.77 - 5.28 x10E6/uL   Hemoglobin 13.8 11.1 - 15.9 g/dL   Hematocrit 40.4 34.0 - 46.6 %   MCV 84 79 - 97 fL   MCH 28.8 26.6 - 33.0 pg   MCHC 34.2 31.5 - 35.7 g/dL   RDW 12.3 11.7 - 15.4 %   Platelets 479 (H) 150 - 450 x10E3/uL   Neutrophils 71 Not Estab. %   Lymphs 22 Not Estab. %   Monocytes 6 Not Estab. %   Eos 0 Not Estab. %   Basos 1 Not Estab. %   Neutrophils Absolute 7.6 (H) 1.4 - 7.0 x10E3/uL   Lymphocytes Absolute 2.3 0.7 - 3.1 x10E3/uL   Monocytes Absolute 0.7 0.1 - 0.9 x10E3/uL   EOS (ABSOLUTE) 0.0 0.0 - 0.4 x10E3/uL   Basophils Absolute 0.1 0.0 - 0.2 x10E3/uL   Immature Granulocytes 0 Not Estab. %   Immature Grans (Abs) 0.0 0.0 - 0.1 x10E3/uL  Comprehensive metabolic panel     Status: Abnormal   Collection Time: 05/31/20  5:01 PM  Result Value Ref Range   Glucose 119 (H) 65 - 99  mg/dL   BUN 16 6 - 24 mg/dL   Creatinine, Ser 0.90 0.57 - 1.00 mg/dL   GFR calc non Af Amer 76 >59 mL/min/1.73   GFR calc Af Amer 88 >59 mL/min/1.73    Comment: **In accordance with recommendations from the NKF-ASN Task force,**   [facility] is in the process of updating its eGFR calculation to the   2021 CKD-EPI creatinine equation that estimates kidney function   without a race variable.    BUN/Creatinine Ratio 18 9 - 23   Sodium 143 134 - 144 mmol/L   Potassium 4.0 3.5 - 5.2 mmol/L   Chloride 106 96 - 106 mmol/L   CO2 21 20 - 29 mmol/L   Calcium 9.8 8.7 - 10.2 mg/dL   Total Protein 6.9 6.0 - 8.5 g/dL   Albumin 4.3 3.8 - 4.8 g/dL   Globulin, Total 2.6 1.5 - 4.5 g/dL   Albumin/Globulin Ratio 1.7 1.2 - 2.2   Bilirubin Total 0.3 0.0 - 1.2 mg/dL   Alkaline Phosphatase 95 44 - 121 IU/L   AST 26 0 - 40 IU/L   ALT 27 0 - 32 IU/L  Lipid Panel With LDL/HDL Ratio     Status: Abnormal   Collection Time: 05/31/20  5:01 PM  Result Value Ref Range   Cholesterol, Total 284 (H) 100 - 199 mg/dL   Triglycerides 179 (H) 0 - 149 mg/dL   HDL 55 >39 mg/dL   VLDL Cholesterol Cal 34 5 - 40 mg/dL   LDL Chol Calc (NIH) 195 (H) 0 - 99 mg/dL   LDL/HDL Ratio 3.5 (  H) 0.0 - 3.2 ratio    Comment:                                     LDL/HDL Ratio                                             Men  Women                               1/2 Avg.Risk  1.0    1.5                                   Avg.Risk  3.6    3.2                                2X Avg.Risk  6.2    5.0                                3X Avg.Risk  8.0    6.1   T4, free     Status: None   Collection Time: 05/31/20  5:03 PM  Result Value Ref Range   Free T4 1.44 0.82 - 1.77 ng/dL  TSH     Status: Abnormal   Collection Time: 05/31/20  5:03 PM  Result Value Ref Range   TSH 0.065 (L) 0.450 - 4.500 uIU/mL        Assessment/Plan: 1. Encounter for general adult medical examination with abnormal findings Reviewed recent lab work. Discussed  mammogram and colonoscopy and patient refuses at this time. I explained the purpose and importance of these preventative screening exams, but she still refused.  2. Elevated BP without diagnosis of hypertension Pt took decongestant prior to visit and may be causing elevated BP. Recheck showed improvement. Pt has cuff at home and will monitor closely and call office if not returning to normal. She understands that her BP must be within normal limits for upcoming surgery and will f/u accordingly if not improving.  3. Mixed hyperlipidemia Recent lab work shows elevated TG and cholesterol. Discussed that we need to start statin. - atorvastatin (LIPITOR) 10 MG tablet; Take 1 tablet (10 mg total) by mouth daily.  Dispense: 30 tablet; Refill: 2  4. GAD (generalized anxiety disorder) Continue Hydroxyzine as needed.  5. Hypothyroidism, unspecified type Continue synthroid 29mg daily. Free T4 on recent labs appropriate at this dose.  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  7. Neuralgia and neuritis, unspecified Continue gabapentin 3038mBID.  8. Restless leg syndrome Continue Requip 11m4mt bedtime.  9. Chronic midline low back pain without sciatica May continue Zanaflex as needed for back pain, but limit to 1/2-1 tab total per day.  10. Surgery follow-up examination - EKG 12-Lead  11. Dysuria - UA/M w/rflx Culture, Routine  Pt is low risk for surgery, however will need monitoring for the BP.  General Counseling: Keimani verbalizes understanding of the findings of todays visit and agrees with plan of treatment. I have  discussed any further diagnostic evaluation that may be needed or ordered today. We also reviewed her medications today. she has been encouraged to call the office with any questions or concerns that should arise related to todays visit.    Counseling:    Orders Placed This Encounter  Procedures  . UA/M w/rflx Culture, Routine   . EKG 12-Lead    Meds ordered this encounter  Medications  . atorvastatin (LIPITOR) 10 MG tablet    Sig: Take 1 tablet (10 mg total) by mouth daily.    Dispense:  30 tablet    Refill:  2    Total time spent:30 Minutes  Time spent includes review of chart, medications, test results, and follow up plan with the patient.     Lavera Guise, MD  Internal Medicine

## 2020-06-08 ENCOUNTER — Telehealth: Payer: Self-pay

## 2020-06-08 ENCOUNTER — Other Ambulatory Visit: Payer: Self-pay

## 2020-06-08 DIAGNOSIS — F411 Generalized anxiety disorder: Secondary | ICD-10-CM

## 2020-06-08 LAB — UA/M W/RFLX CULTURE, ROUTINE
Bilirubin, UA: NEGATIVE
Glucose, UA: NEGATIVE
Leukocytes,UA: NEGATIVE
Nitrite, UA: NEGATIVE
Protein,UA: NEGATIVE
RBC, UA: NEGATIVE
Specific Gravity, UA: >=1.03 — AB (ref 1.005–1.030)
Urobilinogen, Ur: 0.2 mg/dL (ref 0.2–1.0)
pH, UA: 6 (ref 5.0–7.5)

## 2020-06-08 LAB — MICROSCOPIC EXAMINATION
Bacteria, UA: NONE SEEN
Casts: NONE SEEN /lpf
WBC, UA: NONE SEEN /hpf (ref 0–5)

## 2020-06-08 MED ORDER — HYDROXYZINE HCL 50 MG PO TABS: 50.0000 mg | ORAL_TABLET | Freq: Three times a day (TID) | ORAL | 0 refills | Status: DC | PRN

## 2020-06-08 NOTE — Telephone Encounter (Signed)
Pt called, she said she spoke with the pharmacy and was told there was not a refill available for her tizanidine, I called the pharmacy and they explained she wasn't due to pick up the refill until 06-24-20. Spoke with the pt and explained there was a miscommunication, and informed her of when she can pick up next refill.

## 2020-06-11 NOTE — Addendum Note (Signed)
Addended by: Drema Dallas on: 06/11/2020 09:46 AM   Modules accepted: Level of Service

## 2020-07-06 ENCOUNTER — Telehealth: Payer: Self-pay

## 2020-07-06 ENCOUNTER — Other Ambulatory Visit: Payer: Self-pay | Admitting: Physician Assistant

## 2020-07-06 ENCOUNTER — Other Ambulatory Visit: Payer: Self-pay | Admitting: Nurse Practitioner

## 2020-07-06 DIAGNOSIS — F409 Phobic anxiety disorder, unspecified: Secondary | ICD-10-CM

## 2020-07-06 MED ORDER — ZOLPIDEM TARTRATE ER 12.5 MG PO TBCR: 12.5000 mg | EXTENDED_RELEASE_TABLET | Freq: Every evening | ORAL | 0 refills | Status: DC | PRN

## 2020-07-07 NOTE — Telephone Encounter (Signed)
Done

## 2020-08-03 ENCOUNTER — Other Ambulatory Visit: Payer: Self-pay | Admitting: Nurse Practitioner

## 2020-08-03 DIAGNOSIS — J301 Allergic rhinitis due to pollen: Secondary | ICD-10-CM

## 2020-08-04 ENCOUNTER — Other Ambulatory Visit: Payer: Self-pay

## 2020-08-04 DIAGNOSIS — M545 Low back pain, unspecified: Secondary | ICD-10-CM

## 2020-08-04 DIAGNOSIS — E782 Mixed hyperlipidemia: Secondary | ICD-10-CM

## 2020-08-04 DIAGNOSIS — R300 Dysuria: Secondary | ICD-10-CM

## 2020-08-04 DIAGNOSIS — F411 Generalized anxiety disorder: Secondary | ICD-10-CM

## 2020-08-04 DIAGNOSIS — M792 Neuralgia and neuritis, unspecified: Secondary | ICD-10-CM

## 2020-08-04 DIAGNOSIS — G2581 Restless legs syndrome: Secondary | ICD-10-CM

## 2020-08-04 DIAGNOSIS — F409 Phobic anxiety disorder, unspecified: Secondary | ICD-10-CM

## 2020-08-04 DIAGNOSIS — Z09 Encounter for follow-up examination after completed treatment for conditions other than malignant neoplasm: Secondary | ICD-10-CM

## 2020-08-04 DIAGNOSIS — R030 Elevated blood-pressure reading, without diagnosis of hypertension: Secondary | ICD-10-CM

## 2020-08-04 DIAGNOSIS — G8929 Other chronic pain: Secondary | ICD-10-CM

## 2020-08-04 DIAGNOSIS — Z0001 Encounter for general adult medical examination with abnormal findings: Secondary | ICD-10-CM

## 2020-08-04 DIAGNOSIS — E039 Hypothyroidism, unspecified: Secondary | ICD-10-CM

## 2020-08-04 MED ORDER — HYDROXYZINE HCL 50 MG PO TABS: 50.0000 mg | ORAL_TABLET | Freq: Three times a day (TID) | ORAL | 3 refills | Status: DC | PRN

## 2020-08-04 MED ORDER — ATORVASTATIN CALCIUM 10 MG PO TABS: 10.0000 mg | ORAL_TABLET | Freq: Every day | ORAL | 3 refills | Status: DC

## 2020-08-04 MED ORDER — GABAPENTIN 300 MG PO CAPS: 300.0000 mg | ORAL_CAPSULE | Freq: Two times a day (BID) | ORAL | 3 refills | Status: DC

## 2020-08-05 ENCOUNTER — Other Ambulatory Visit: Payer: Self-pay | Admitting: Physician Assistant

## 2020-08-05 DIAGNOSIS — F409 Phobic anxiety disorder, unspecified: Secondary | ICD-10-CM

## 2020-08-05 MED ORDER — ZOLPIDEM TARTRATE ER 12.5 MG PO TBCR: 12.5000 mg | EXTENDED_RELEASE_TABLET | Freq: Every evening | ORAL | 0 refills | Status: DC | PRN

## 2020-09-03 ENCOUNTER — Ambulatory Visit: Payer: Self-pay | Admitting: Physician Assistant

## 2020-09-03 ENCOUNTER — Other Ambulatory Visit: Payer: Self-pay

## 2020-09-03 ENCOUNTER — Encounter: Payer: Self-pay | Admitting: Physician Assistant

## 2020-09-03 DIAGNOSIS — F409 Phobic anxiety disorder, unspecified: Secondary | ICD-10-CM

## 2020-09-03 DIAGNOSIS — E669 Obesity, unspecified: Secondary | ICD-10-CM

## 2020-09-03 DIAGNOSIS — M545 Low back pain, unspecified: Secondary | ICD-10-CM

## 2020-09-03 DIAGNOSIS — F5105 Insomnia due to other mental disorder: Secondary | ICD-10-CM

## 2020-09-03 DIAGNOSIS — G2581 Restless legs syndrome: Secondary | ICD-10-CM

## 2020-09-03 DIAGNOSIS — R7309 Other abnormal glucose: Secondary | ICD-10-CM

## 2020-09-03 DIAGNOSIS — F411 Generalized anxiety disorder: Secondary | ICD-10-CM

## 2020-09-03 DIAGNOSIS — E039 Hypothyroidism, unspecified: Secondary | ICD-10-CM

## 2020-09-03 DIAGNOSIS — G8929 Other chronic pain: Secondary | ICD-10-CM

## 2020-09-03 DIAGNOSIS — Z716 Tobacco abuse counseling: Secondary | ICD-10-CM

## 2020-09-03 MED ORDER — RYBELSUS 3 MG PO TABS: 3.0000 mg | ORAL_TABLET | Freq: Every day | ORAL | 0 refills | Status: DC

## 2020-09-03 MED ORDER — ZOLPIDEM TARTRATE ER 12.5 MG PO TBCR: 12.5000 mg | EXTENDED_RELEASE_TABLET | Freq: Every evening | ORAL | 0 refills | Status: DC | PRN

## 2020-09-03 MED ORDER — VARENICLINE TARTRATE 0.5 MG X 11 & 1 MG X 42 PO MISC: ORAL | 0 refills | Status: DC

## 2020-09-03 MED ORDER — LEVOTHYROXINE SODIUM 200 MCG PO TABS: 200.0000 ug | ORAL_TABLET | Freq: Every day | ORAL | 0 refills | Status: DC

## 2020-09-03 MED ORDER — ROPINIROLE HCL 1 MG PO TABS: 1.0000 mg | ORAL_TABLET | Freq: Every day | ORAL | 0 refills | Status: DC

## 2020-09-03 MED ORDER — TIZANIDINE HCL 4 MG PO TABS: 2.0000 mg | ORAL_TABLET | Freq: Two times a day (BID) | ORAL | 0 refills | Status: DC | PRN

## 2020-09-03 NOTE — Progress Notes (Signed)
[facility] [place], [place] [address]  Internal MEDICINE  Office Visit Note  Patient Name: Stephanie Larson  [id number]  [id number]  Date of Service: 09/03/2020  Chief Complaint  Patient presents with  . Follow-up    Review med, weight management     HPI Pt is here for follow up -She really wants to quit smoking, but thinks she needs something to help her. She has had a lot of dental work due to side effects of smoking and is committed to stopping. Also having difficulty with weight. Has gained 6lbs since the last visit. Has previously taken wellbutrin and stopped in 2016. Smokes 7-9 cigs per day. States the wellbutrin did not help her lose weight or stop smoking previously and wants to do chantix instead. -Did have abnormal fasting glucose and is interested in weight loss medication that can help keep sugar controlled -Also mentions she has some all over pain pain, used to get cortisone injections that helped awhile--will call to see about getting these again  Current Medication: Outpatient Encounter Medications as of 09/03/2020  Medication Sig Note  . atorvastatin (LIPITOR) 10 MG tablet Take 1 tablet (10 mg total) by mouth daily.   . chlorhexidine (PERIDEX) 0.12 % solution Use as directed 10 mLs in the mouth or throat 2 (two) times daily.   . fexofenadine (ALLEGRA) 180 MG tablet Take 1 tablet (180 mg total) by mouth daily.   . fluconazole (DIFLUCAN) 150 MG tablet Take 1 tablet po once. May repeat dose in 3 days as needed for persistent symptoms.   Marland Kitchen gabapentin (NEURONTIN) 300 MG capsule Take 1 capsule (300 mg total) by mouth 2 (two) times daily.   . hydrOXYzine (ATARAX/VISTARIL) 50 MG tablet Take 1 tablet (50 mg total) by mouth 3 (three) times daily as needed.   . mometasone (ELOCON) 0.1 % cream APPLY TO AFFECTED AREAS FROM FACE TO LOWER EXTREMITIES TWICE A DAY TO SPOT TREAT DURING FLARES 06/23/2019: As needed for Eczema  . Semaglutide (RYBELSUS) 3 MG TABS Take 3 mg  by mouth daily.   . varenicline (CHANTIX PAK) 0.5 MG X 11 & 1 MG X 42 tablet Take one 0.5 mg tablet by mouth once daily for 3 days, then increase to one 0.5 mg tablet twice daily for 4 days, then increase to one 1 mg tablet twice daily.   . [DISCONTINUED] levothyroxine (SYNTHROID) 200 MCG tablet Take 1 tablet (200 mcg total) by mouth daily before breakfast.   . [DISCONTINUED] rOPINIRole (REQUIP) 1 MG tablet Take 1 tablet (1 mg total) by mouth at bedtime.   . [DISCONTINUED] tiZANidine (ZANAFLEX) 4 MG tablet Take 0.5-1 tablets (2-4 mg total) by mouth 2 (two) times daily as needed for muscle spasms.   . [DISCONTINUED] zolpidem (AMBIEN CR) 12.5 MG CR tablet Take 1 tablet (12.5 mg total) by mouth at bedtime as needed for sleep.   Marland Kitchen levothyroxine (SYNTHROID) 200 MCG tablet Take 1 tablet (200 mcg total) by mouth daily before breakfast.   . rOPINIRole (REQUIP) 1 MG tablet Take 1 tablet (1 mg total) by mouth at bedtime.   Marland Kitchen tiZANidine (ZANAFLEX) 4 MG tablet Take 0.5-1 tablets (2-4 mg total) by mouth 2 (two) times daily as needed for muscle spasms.   Marland Kitchen zolpidem (AMBIEN CR) 12.5 MG CR tablet Take 1 tablet (12.5 mg total) by mouth at bedtime as needed for sleep.    No facility-administered encounter medications on file as of 09/03/2020.    Surgical History: Past  Surgical History:  Procedure Laterality Date  . CYSTOSCOPY N/A 05/13/2019   Procedure: CYSTOSCOPY;  Surgeon: Will Bonnet, MD;  Location: [facility] ORS;  Service: Gynecology;  Laterality: N/A;  . LAPAROSCOPIC GASTRIC BANDING    . TOTAL LAPAROSCOPIC HYSTERECTOMY WITH SALPINGECTOMY N/A 05/13/2019   Procedure: TOTAL LAPAROSCOPIC HYSTERECTOMY WITH BILATERAL SALPINGECTOMY;  Surgeon: Will Bonnet, MD;  Location: [facility] ORS;  Service: Gynecology;  Laterality: N/A;  . TUBAL LIGATION      Medical History: Past Medical History:  Diagnosis Date  . Anxiety   . Depression   . Family history of ovarian cancer    qualifies for cancer genetic testing  .  Hypothyroidism   . Insomnia   . Memory changes   . Sinus drainage   . Sleep apnea   . Thyroid disease     Family History: Family History  Problem Relation Age of Onset  . Colon cancer Paternal Grandfather   . Ovarian cancer Paternal Grandmother 24  . Hypertension Maternal Grandmother   . Hypertension Father   . Emphysema Father   . ALS Mother   . Hypertension Mother     Social History   Socioeconomic History  . Marital status: Married    Spouse name: Not on file  . Number of children: Not on file  . Years of education: Not on file  . Highest education level: Not on file  Occupational History  . Not on file  Tobacco Use  . Smoking status: Current Every Day Smoker    Packs/day: 0.50    Types: Cigarettes  . Smokeless tobacco: Never Used  Vaping Use  . Vaping Use: Never used  Substance and Sexual Activity  . Alcohol use: Not Currently    Comment: occasionally  . Drug use: Never  . Sexual activity: Yes    Birth control/protection: None  Other Topics Concern  . Not on file  Social History Narrative  . Not on file   Social Determinants of Health   Financial Resource Strain: Not on file  Food Insecurity: Not on file  Transportation Needs: Not on file  Physical Activity: Not on file  Stress: Not on file  Social Connections: Not on file  Intimate Partner Violence: Not on file      Review of Systems  Constitutional: Positive for unexpected weight change. Negative for chills and fatigue.  HENT: Positive for postnasal drip. Negative for congestion, rhinorrhea, sneezing and sore throat.   Eyes: Negative for redness.  Respiratory: Negative for cough, chest tightness and shortness of breath.   Cardiovascular: Negative for chest pain and palpitations.  Gastrointestinal: Negative for abdominal pain, constipation, diarrhea, nausea and vomiting.  Genitourinary: Negative for dysuria and frequency.  Musculoskeletal: Positive for arthralgias, back pain and myalgias.  Negative for joint swelling and neck pain.  Skin: Negative for rash.  Neurological: Negative.  Negative for tremors and numbness.  Hematological: Negative for adenopathy. Does not bruise/bleed easily.  Psychiatric/Behavioral: Positive for sleep disturbance. Negative for behavioral problems (Depression) and suicidal ideas. The patient is nervous/anxious.     Vital Signs: BP (!) 142/92   Pulse 76   Temp 98.3 F (36.8 C)   Resp 16   Ht 5\' 6"  (1.676 m)   Wt 222 lb 3.2 oz (100.8 kg)   LMP 04/18/2019 (Exact Date)   SpO2 (!) 85%   BMI 35.86 kg/m    Physical Exam Vitals and nursing note reviewed.  Constitutional:      General: She is not in acute distress.  Appearance: She is well-developed. She is obese. She is not diaphoretic.  HENT:     Head: Normocephalic and atraumatic.     Mouth/Throat:     Pharynx: No oropharyngeal exudate.  Eyes:     Pupils: Pupils are equal, round, and reactive to light.  Neck:     Thyroid: No thyromegaly.     Vascular: No JVD.     Trachea: No tracheal deviation.  Cardiovascular:     Rate and Rhythm: Normal rate and regular rhythm.     Heart sounds: Normal heart sounds. No murmur heard. No friction rub. No gallop.   Pulmonary:     Effort: Pulmonary effort is normal. No respiratory distress.     Breath sounds: No wheezing or rales.  Chest:     Chest wall: No tenderness.  Abdominal:     General: Bowel sounds are normal.     Palpations: Abdomen is soft.  Musculoskeletal:        General: No tenderness. Normal range of motion.     Cervical back: Normal range of motion and neck supple.     Comments: Back and joint pain worse with movement  Lymphadenopathy:     Cervical: No cervical adenopathy.  Skin:    General: Skin is warm and dry.  Neurological:     Mental Status: She is alert and oriented to person, place, and time.     Cranial Nerves: No cranial nerve deficit.  Psychiatric:        Behavior: Behavior normal.        Thought Content:  Thought content normal.        Judgment: Judgment normal.        Assessment/Plan: 1. GAD (generalized anxiety disorder) Continue hydroxyzine as needed  2. Hypothyroidism, unspecified type Continue synthroid - levothyroxine (SYNTHROID) 200 MCG tablet; Take 1 tablet (200 mcg total) by mouth daily before breakfast.  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  Dispense: 30 tablet; Refill: 0  4. Restless leg syndrome Continue requip at bedtime - rOPINIRole (REQUIP) 1 MG tablet; Take 1 tablet (1 mg total) by mouth at bedtime.  Dispense: 30 tablet; Refill: 0  5. Chronic midline low back pain without sciatica May continue tizanidine as needed, pt also plans to follow up with ortho/pain management for possible injections - tiZANidine (ZANAFLEX) 4 MG tablet; Take 0.5-1 tablets (2-4 mg total) by mouth 2 (two) times daily as needed for muscle spasms.  Dispense: 60 tablet; Refill: 0  6. Abnormal blood sugar Will start rybelsus for abnormal blood sugar and wt loss - Semaglutide (RYBELSUS) 3 MG TABS; Take 3 mg by mouth daily.  Dispense: 30 tablet; Refill: 0  7. Obesity (BMI 35.0-39.9 without comorbidity) Start rybelsus for abnormal blood sugar and wt loss.  - Semaglutide (RYBELSUS) 3 MG TABS; Take 3 mg by mouth daily.  Dispense: 30 tablet; Refill: 0 Obesity Counseling: Had a lengthy discussion regarding patients BMI and weight issues. Patient was instructed on portion control as well as increased activity. Also discussed caloric restrictions with trying to maintain intake less than 2000 Kcal. Discussions were made in accordance with the 5As of weight management. Simple actions such as not eating late and if able to, taking a walk is suggested.  8. Encounter for smoking cessation counseling Will start chantix  to aid in  smoking cessation - varenicline (CHANTIX PAK) 0.5 MG X 11 & 1 MG X 42 tablet; Take one 0.5 mg tablet by mouth once daily for 3 days, then increase to one 0.5 mg tablet twice daily for 4 days, then increase to one 1 mg tablet twice daily.  Dispense: 53 tablet; Refill: 0   General Counseling: Lisbet verbalizes understanding of the findings of todays visit and agrees with plan of treatment. I have discussed any further diagnostic evaluation that may be needed or ordered today. We also reviewed her medications today. she has been encouraged to call the office with any questions or concerns that should arise related to todays visit.    No orders of the defined types were placed in this encounter.   Meds ordered this encounter  Medications  . Semaglutide (RYBELSUS) 3 MG TABS    Sig: Take 3 mg by mouth daily.    Dispense:  30 tablet    Refill:  0  . varenicline (CHANTIX PAK) 0.5 MG X 11 & 1 MG X 42 tablet    Sig: Take one 0.5 mg tablet by mouth once daily for 3 days, then increase to one 0.5 mg tablet twice daily for 4 days, then increase to one 1 mg tablet twice daily.    Dispense:  53 tablet    Refill:  0  . levothyroxine (SYNTHROID) 200 MCG tablet    Sig: Take 1 tablet (200 mcg total) by mouth daily before breakfast.    Dispense:  30 tablet    Refill:  0  . zolpidem (AMBIEN CR) 12.5 MG CR tablet    Sig: Take 1 tablet (12.5 mg total) by mouth at bedtime as needed for sleep.    Dispense:  30 tablet    Refill:  0  . rOPINIRole (REQUIP) 1 MG tablet    Sig: Take 1 tablet (1 mg total) by mouth at bedtime.    Dispense:  30 tablet    Refill:  0  . tiZANidine (ZANAFLEX) 4 MG tablet    Sig: Take 0.5-1 tablets (2-4 mg total) by mouth 2 (two) times daily as needed for muscle spasms.    Dispense:  60 tablet    Refill:  0    This patient was seen by Drema Dallas, PA-C in collaboration with Dr. Clayborn Bigness as a part of collaborative care agreement.   Total time spent:30 Minutes Time spent  includes review of chart, medications, test results, and follow up plan with the patient.      Dr Lavera Guise Internal medicine

## 2020-09-30 ENCOUNTER — Ambulatory Visit: Payer: Self-pay | Admitting: Physician Assistant

## 2020-09-30 ENCOUNTER — Encounter: Payer: Self-pay | Admitting: Physician Assistant

## 2020-09-30 ENCOUNTER — Other Ambulatory Visit: Payer: Self-pay

## 2020-09-30 DIAGNOSIS — R030 Elevated blood-pressure reading, without diagnosis of hypertension: Secondary | ICD-10-CM

## 2020-09-30 DIAGNOSIS — E039 Hypothyroidism, unspecified: Secondary | ICD-10-CM

## 2020-09-30 DIAGNOSIS — Z7689 Persons encountering health services in other specified circumstances: Secondary | ICD-10-CM

## 2020-09-30 DIAGNOSIS — E669 Obesity, unspecified: Secondary | ICD-10-CM

## 2020-09-30 MED ORDER — TOPIRAMATE 25 MG PO TABS: 25.0000 mg | ORAL_TABLET | Freq: Every day | ORAL | 1 refills | Status: DC

## 2020-09-30 NOTE — Progress Notes (Signed)
[facility] [place], [place] [address]  Internal MEDICINE  Office Visit Note  Patient Name: Stephanie Larson  [id number]  [id number]  Date of Service: 10/03/2020  Chief Complaint  Patient presents with  . Follow-up  . Depression  . Anxiety  . Sleep Apnea    HPI Patient is here for follow-up for weight management -Reports she is down to 5 cigs per day now on her own.  States that Chantix was too expensive and patient was unwilling to retry Wellbutrin.  -She is trying to lose weight and was prescribed Rybelsus at last visit however patient reports this was too expensive and she never picked that up.  She is requesting alternative weight loss medication at this time  Current Medication: Outpatient Encounter Medications as of 09/30/2020  Medication Sig Note  . topiramate (TOPAMAX) 25 MG tablet Take 1 tablet (25 mg total) by mouth daily.   Marland Kitchen atorvastatin (LIPITOR) 10 MG tablet Take 1 tablet (10 mg total) by mouth daily.   . chlorhexidine (PERIDEX) 0.12 % solution Use as directed 10 mLs in the mouth or throat 2 (two) times daily.   . fexofenadine (ALLEGRA) 180 MG tablet Take 1 tablet (180 mg total) by mouth daily.   Marland Kitchen gabapentin (NEURONTIN) 300 MG capsule Take 1 capsule (300 mg total) by mouth 2 (two) times daily.   . hydrOXYzine (ATARAX/VISTARIL) 50 MG tablet Take 1 tablet (50 mg total) by mouth 3 (three) times daily as needed.   Marland Kitchen levothyroxine (SYNTHROID) 200 MCG tablet Take 1 tablet (200 mcg total) by mouth daily before breakfast.   . mometasone (ELOCON) 0.1 % cream APPLY TO AFFECTED AREAS FROM FACE TO LOWER EXTREMITIES TWICE A DAY TO SPOT TREAT DURING FLARES 06/23/2019: As needed for Eczema  . rOPINIRole (REQUIP) 1 MG tablet Take 1 tablet (1 mg total) by mouth at bedtime.   Marland Kitchen tiZANidine (ZANAFLEX) 4 MG tablet Take 0.5-1 tablets (2-4 mg total) by mouth 2 (two) times daily as needed for muscle spasms.   Marland Kitchen zolpidem (AMBIEN CR) 12.5 MG CR tablet Take 1 tablet (12.5 mg  total) by mouth at bedtime as needed for sleep.   . [DISCONTINUED] fluconazole (DIFLUCAN) 150 MG tablet Take 1 tablet po once. May repeat dose in 3 days as needed for persistent symptoms. (Patient not taking: Reported on 09/30/2020)   . [DISCONTINUED] Semaglutide (RYBELSUS) 3 MG TABS Take 3 mg by mouth daily.   . [DISCONTINUED] varenicline (CHANTIX PAK) 0.5 MG X 11 & 1 MG X 42 tablet Take one 0.5 mg tablet by mouth once daily for 3 days, then increase to one 0.5 mg tablet twice daily for 4 days, then increase to one 1 mg tablet twice daily.    No facility-administered encounter medications on file as of 09/30/2020.    Surgical History: Past Surgical History:  Procedure Laterality Date  . CYSTOSCOPY N/A 05/13/2019   Procedure: CYSTOSCOPY;  Surgeon: Will Bonnet, MD;  Location: [facility] ORS;  Service: Gynecology;  Laterality: N/A;  . LAPAROSCOPIC GASTRIC BANDING    . TOTAL LAPAROSCOPIC HYSTERECTOMY WITH SALPINGECTOMY N/A 05/13/2019   Procedure: TOTAL LAPAROSCOPIC HYSTERECTOMY WITH BILATERAL SALPINGECTOMY;  Surgeon: Will Bonnet, MD;  Location: [facility] ORS;  Service: Gynecology;  Laterality: N/A;  . TUBAL LIGATION      Medical History: Past Medical History:  Diagnosis Date  . Anxiety   . Depression   . Family history of ovarian cancer    qualifies for cancer genetic testing  .  Hypothyroidism   . Insomnia   . Memory changes   . Sinus drainage   . Sleep apnea   . Thyroid disease     Family History: Family History  Problem Relation Age of Onset  . Colon cancer Paternal Grandfather   . Ovarian cancer Paternal Grandmother 10  . Hypertension Maternal Grandmother   . Hypertension Father   . Emphysema Father   . ALS Mother   . Hypertension Mother     Social History   Socioeconomic History  . Marital status: Married    Spouse name: Not on file  . Number of children: Not on file  . Years of education: Not on file  . Highest education level: Not on file  Occupational History   . Not on file  Tobacco Use  . Smoking status: Current Every Day Smoker    Packs/day: 0.50    Types: Cigarettes  . Smokeless tobacco: Never Used  Vaping Use  . Vaping Use: Never used  Substance and Sexual Activity  . Alcohol use: Not Currently    Comment: occasionally  . Drug use: Never  . Sexual activity: Yes    Birth control/protection: None  Other Topics Concern  . Not on file  Social History Narrative  . Not on file   Social Determinants of Health   Financial Resource Strain: Not on file  Food Insecurity: Not on file  Transportation Needs: Not on file  Physical Activity: Not on file  Stress: Not on file  Social Connections: Not on file  Intimate Partner Violence: Not on file      Review of Systems  Constitutional: Negative for chills, fatigue and unexpected weight change.  HENT: Negative for congestion, postnasal drip, rhinorrhea, sneezing and sore throat.   Eyes: Negative for redness.  Respiratory: Negative for cough, chest tightness and shortness of breath.   Cardiovascular: Negative for chest pain and palpitations.  Gastrointestinal: Negative for abdominal pain, constipation, diarrhea, nausea and vomiting.  Genitourinary: Negative for dysuria and frequency.  Musculoskeletal: Positive for arthralgias. Negative for back pain, joint swelling and neck pain.  Skin: Negative for rash.  Neurological: Negative.  Negative for tremors and numbness.  Hematological: Negative for adenopathy. Does not bruise/bleed easily.  Psychiatric/Behavioral: Positive for sleep disturbance. Negative for behavioral problems (Depression) and suicidal ideas. The patient is nervous/anxious.     Vital Signs: BP (!) 146/86   Pulse 82   Temp (!) 97.4 F (36.3 C)   Resp 16   Ht 5\' 6"  (1.676 m)   Wt 221 lb 3.2 oz (100.3 kg)   LMP 04/18/2019 (Exact Date)   SpO2 99%   BMI 35.70 kg/m    Physical Exam Vitals and nursing note reviewed.  Constitutional:      General: She is not in acute  distress.    Appearance: She is well-developed. She is obese. She is not diaphoretic.  HENT:     Head: Normocephalic and atraumatic.     Mouth/Throat:     Pharynx: No oropharyngeal exudate.  Eyes:     Pupils: Pupils are equal, round, and reactive to light.  Neck:     Thyroid: No thyromegaly.     Vascular: No JVD.     Trachea: No tracheal deviation.  Cardiovascular:     Rate and Rhythm: Normal rate and regular rhythm.     Heart sounds: Normal heart sounds. No murmur heard. No friction rub. No gallop.   Pulmonary:     Effort: Pulmonary effort is normal. No  respiratory distress.     Breath sounds: No wheezing or rales.  Chest:     Chest wall: No tenderness.  Abdominal:     General: Bowel sounds are normal.     Palpations: Abdomen is soft.  Musculoskeletal:        General: Normal range of motion.     Cervical back: Normal range of motion and neck supple.  Lymphadenopathy:     Cervical: No cervical adenopathy.  Skin:    General: Skin is warm and dry.  Neurological:     Mental Status: She is alert and oriented to person, place, and time.     Cranial Nerves: No cranial nerve deficit.  Psychiatric:        Behavior: Behavior normal.        Thought Content: Thought content normal.        Judgment: Judgment normal.        Assessment/Plan: 1. Encounter for weight management Patient has been unsuccessful at losing weight with diet and exercise alone and was unable to pick up Rybelsus due to cost.  We will try alternative topiramate.  Patient unwilling to retry Wellbutrin due to using it in the past with no benefit of weight loss  2. Obesity (BMI 35.0-39.9 without comorbidity) We will start on Topamax 25 mg/day, may need to titrate up if indicated. Obesity Counseling: Had a lengthy discussion regarding patients BMI and weight issues. Patient was instructed on portion control as well as increased activity. Also discussed caloric restrictions with trying to maintain intake less than  2000 Kcal. Discussions were made in accordance with the 5As of weight management. Simple actions such as not eating late and if able to, taking a walk is suggested. - topiramate (TOPAMAX) 25 MG tablet; Take 1 tablet (25 mg total) by mouth daily.  Dispense: 30 tablet; Refill: 1  3. Hypothyroidism, unspecified type Continue Synthroid as prescribed  4. Elevated BP without diagnosis of hypertension Will continue to monitor BP closely if still elevated at next visit we will need to consider initiating therapy   General Counseling: Colleena verbalizes understanding of the findings of todays visit and agrees with plan of treatment. I have discussed any further diagnostic evaluation that may be needed or ordered today. We also reviewed her medications today. she has been encouraged to call the office with any questions or concerns that should arise related to todays visit.    No orders of the defined types were placed in this encounter.   Meds ordered this encounter  Medications  . topiramate (TOPAMAX) 25 MG tablet    Sig: Take 1 tablet (25 mg total) by mouth daily.    Dispense:  30 tablet    Refill:  1    This patient was seen by Drema Dallas, PA-C in collaboration with Dr. Clayborn Bigness as a part of collaborative care agreement.   Total time spent:30 Minutes Time spent includes review of chart, medications, test results, and follow up plan with the patient.      Dr Lavera Guise Internal medicine

## 2020-10-03 NOTE — Patient Instructions (Signed)

## 2020-10-05 ENCOUNTER — Other Ambulatory Visit: Payer: Self-pay | Admitting: Physician Assistant

## 2020-10-05 ENCOUNTER — Telehealth: Payer: Self-pay

## 2020-10-05 DIAGNOSIS — F409 Phobic anxiety disorder, unspecified: Secondary | ICD-10-CM

## 2020-10-05 MED ORDER — ZOLPIDEM TARTRATE ER 12.5 MG PO TBCR
12.5000 mg | EXTENDED_RELEASE_TABLET | Freq: Every evening | ORAL | 0 refills | Status: DC | PRN
Start: 2020-10-05 — End: 2020-11-04

## 2020-10-06 NOTE — Telephone Encounter (Signed)
done

## 2020-10-28 ENCOUNTER — Ambulatory Visit: Payer: Self-pay | Admitting: Physician Assistant

## 2020-10-28 ENCOUNTER — Other Ambulatory Visit: Payer: Self-pay

## 2020-10-28 ENCOUNTER — Encounter: Payer: Self-pay | Admitting: Physician Assistant

## 2020-10-28 DIAGNOSIS — F17219 Nicotine dependence, cigarettes, with unspecified nicotine-induced disorders: Secondary | ICD-10-CM

## 2020-10-28 DIAGNOSIS — Z7689 Persons encountering health services in other specified circumstances: Secondary | ICD-10-CM

## 2020-10-28 DIAGNOSIS — F411 Generalized anxiety disorder: Secondary | ICD-10-CM

## 2020-10-28 DIAGNOSIS — E669 Obesity, unspecified: Secondary | ICD-10-CM

## 2020-10-28 DIAGNOSIS — E039 Hypothyroidism, unspecified: Secondary | ICD-10-CM

## 2020-10-28 MED ORDER — BUPROPION HCL ER (XL) 150 MG PO TB24: 150.0000 mg | ORAL_TABLET | Freq: Every day | ORAL | 2 refills | Status: DC

## 2020-10-28 MED ORDER — HYDROXYZINE HCL 50 MG PO TABS: 50.0000 mg | ORAL_TABLET | Freq: Three times a day (TID) | ORAL | 3 refills | Status: DC | PRN

## 2020-10-28 MED ORDER — TOPIRAMATE 25 MG PO TABS: 25.0000 mg | ORAL_TABLET | Freq: Every day | ORAL | 1 refills | Status: DC

## 2020-10-28 NOTE — Progress Notes (Signed)
[facility], [place] [address]  Internal MEDICINE  Office Visit Note  Patient Name: Stephanie Larson  [id number]  [id number]  Date of Service: 11/02/2020  Chief Complaint  Patient presents with   Follow-up    Weight loss   Quality Metric Gaps    pneumovax    HPI Pt is here for routine follow up for wt loss. -She has lost 1 lb since last visit and has not noticed big difference with the topamax alone. She is willing to try wellbutrin this time to help with wt loss as well as smoking cessation. Will continue 25mg  Topamax and add wellbutrin to it -She has been working to increase her exercise and is walking 2 miles on a track most days unless it is too humid/hot. -Encouraged to continue working on diet as well and limiting portions  Current Medication: Outpatient Encounter Medications as of 10/28/2020  Medication Sig Note   atorvastatin (LIPITOR) 10 MG tablet Take 1 tablet (10 mg total) by mouth daily.    chlorhexidine (PERIDEX) 0.12 % solution Use as directed 10 mLs in the mouth or throat 2 (two) times daily.    fexofenadine (ALLEGRA) 180 MG tablet Take 1 tablet (180 mg total) by mouth daily.    gabapentin (NEURONTIN) 300 MG capsule Take 1 capsule (300 mg total) by mouth 2 (two) times daily.    levothyroxine (SYNTHROID) 200 MCG tablet Take 1 tablet (200 mcg total) by mouth daily before breakfast.    mometasone (ELOCON) 0.1 % cream APPLY TO AFFECTED AREAS FROM FACE TO LOWER EXTREMITIES TWICE A DAY TO SPOT TREAT DURING FLARES 06/23/2019: As needed for Eczema   rOPINIRole (REQUIP) 1 MG tablet Take 1 tablet (1 mg total) by mouth at bedtime.    tiZANidine (ZANAFLEX) 4 MG tablet Take 0.5-1 tablets (2-4 mg total) by mouth 2 (two) times daily as needed for muscle spasms.    zolpidem (AMBIEN CR) 12.5 MG CR tablet Take 1 tablet (12.5 mg total) by mouth at bedtime as needed for sleep.    [DISCONTINUED] buPROPion (WELLBUTRIN XL) 150 MG 24 hr tablet Take 1 tablet (150 mg  total) by mouth daily.    [DISCONTINUED] hydrOXYzine (ATARAX/VISTARIL) 50 MG tablet Take 1 tablet (50 mg total) by mouth 3 (three) times daily as needed.    [DISCONTINUED] topiramate (TOPAMAX) 25 MG tablet Take 1 tablet (25 mg total) by mouth daily.    buPROPion (WELLBUTRIN XL) 150 MG 24 hr tablet Take 1 tablet (150 mg total) by mouth daily.    topiramate (TOPAMAX) 25 MG tablet Take 1 tablet (25 mg total) by mouth daily.    No facility-administered encounter medications on file as of 10/28/2020.    Surgical History: Past Surgical History:  Procedure Laterality Date   CYSTOSCOPY N/A 05/13/2019   Procedure: CYSTOSCOPY;  Surgeon: Will Bonnet, MD;  Location: [facility];  Service: Gynecology;  Laterality: N/A;   LAPAROSCOPIC GASTRIC BANDING     TOTAL LAPAROSCOPIC HYSTERECTOMY WITH SALPINGECTOMY N/A 05/13/2019   Procedure: TOTAL LAPAROSCOPIC HYSTERECTOMY WITH BILATERAL SALPINGECTOMY;  Surgeon: Will Bonnet, MD;  Location: [facility];  Service: Gynecology;  Laterality: N/A;   TUBAL LIGATION      Medical History: Past Medical History:  Diagnosis Date   Anxiety    Depression    Family history of ovarian cancer    qualifies for cancer genetic testing   Hypothyroidism    Insomnia    Memory changes    Sinus drainage  Sleep apnea    Thyroid disease     Family History: Family History  Problem Relation Age of Onset   Colon cancer Paternal Grandfather    Ovarian cancer Paternal Grandmother 59   Hypertension Maternal Grandmother    Hypertension Father    Emphysema Father    ALS Mother    Hypertension Mother     Social History   Socioeconomic History   Marital status: Married    Spouse name: Not on file   Number of children: Not on file   Years of education: Not on file   Highest education level: Not on file  Occupational History   Not on file  Tobacco Use   Smoking status: Every Day    Packs/day: 0.50    Pack years: 0.00    Types: Cigarettes   Smokeless tobacco:  Never  Vaping Use   Vaping Use: Never used  Substance and Sexual Activity   Alcohol use: Not Currently    Comment: occasionally   Drug use: Never   Sexual activity: Yes    Birth control/protection: None  Other Topics Concern   Not on file  Social History Narrative   Not on file   Social Determinants of Health   Financial Resource Strain: Not on file  Food Insecurity: Not on file  Transportation Needs: Not on file  Physical Activity: Not on file  Stress: Not on file  Social Connections: Not on file  Intimate Partner Violence: Not on file      Review of Systems  Constitutional:  Negative for chills, fatigue and unexpected weight change.  HENT:  Negative for congestion, postnasal drip, rhinorrhea, sneezing and sore throat.   Eyes:  Negative for redness.  Respiratory:  Negative for cough, chest tightness and shortness of breath.   Cardiovascular:  Negative for chest pain and palpitations.  Gastrointestinal:  Negative for abdominal pain, constipation, diarrhea, nausea and vomiting.  Genitourinary:  Negative for dysuria and frequency.  Musculoskeletal:  Positive for arthralgias. Negative for back pain, joint swelling and neck pain.  Skin:  Negative for rash.  Neurological: Negative.  Negative for tremors and numbness.  Hematological:  Negative for adenopathy. Does not bruise/bleed easily.  Psychiatric/Behavioral:  Positive for sleep disturbance. Negative for behavioral problems (Depression) and suicidal ideas. The patient is nervous/anxious.    Vital Signs: BP 132/82   Pulse 82   Temp (!) 97.3 F (36.3 C)   Resp 16   Ht 5\' 6"  (1.676 m)   Wt 220 lb 6.4 oz (100 kg)   LMP 04/18/2019 (Exact Date)   SpO2 98%   BMI 35.57 kg/m    Physical Exam Vitals and nursing note reviewed.  Constitutional:      General: She is not in acute distress.    Appearance: She is well-developed. She is obese. She is not diaphoretic.  HENT:     Head: Normocephalic and atraumatic.      Mouth/Throat:     Pharynx: No oropharyngeal exudate.  Eyes:     Pupils: Pupils are equal, round, and reactive to light.  Neck:     Thyroid: No thyromegaly.     Vascular: No JVD.     Trachea: No tracheal deviation.  Cardiovascular:     Rate and Rhythm: Normal rate and regular rhythm.     Heart sounds: Normal heart sounds. No murmur heard.   No friction rub. No gallop.  Pulmonary:     Effort: Pulmonary effort is normal. No respiratory distress.  Breath sounds: No wheezing or rales.  Chest:     Chest wall: No tenderness.  Abdominal:     General: Bowel sounds are normal.     Palpations: Abdomen is soft.  Musculoskeletal:        General: Normal range of motion.     Cervical back: Normal range of motion and neck supple.  Lymphadenopathy:     Cervical: No cervical adenopathy.  Skin:    General: Skin is warm and dry.  Neurological:     Mental Status: She is alert and oriented to person, place, and time.     Cranial Nerves: No cranial nerve deficit.  Psychiatric:        Behavior: Behavior normal.        Thought Content: Thought content normal.        Judgment: Judgment normal.       Assessment/Plan: 1. Encounter for weight management Will add wellbutrin for smoking cessation as well as wt management in  addition to topamax - buPROPion (WELLBUTRIN XL) 150 MG 24 hr tablet; Take 1 tablet (150 mg total) by mouth daily.  Dispense: 30 tablet; Refill: 2  2. Cigarette nicotine dependence with nicotine-induced disorder Will start on wellbutrin for smoking cessation and wt loss. - buPROPion (WELLBUTRIN XL) 150 MG 24 hr tablet; Take 1 tablet (150 mg total) by mouth daily.  Dispense: 30 tablet; Refill: 2  3. Hypothyroidism, unspecified type Continue synthroid  4. Obesity (BMI 35.0-39.9 without comorbidity) Continue topamax and add wellbutrin for combined wt loss effect - topiramate (TOPAMAX) 25 MG tablet; Take 1 tablet (25 mg total) by mouth daily.  Dispense: 30 tablet; Refill:  1 Obesity Counseling: Had a lengthy discussion regarding patients BMI and weight issues. Patient was instructed on portion control as well as increased activity. Also discussed caloric restrictions with trying to maintain intake less than 2000 Kcal. Discussions were made in accordance with the 5As of weight management. Simple actions such as not eating late and if able to, taking a walk is suggested.    General Counseling: Onesty verbalizes understanding of the findings of todays visit and agrees with plan of treatment. I have discussed any further diagnostic evaluation that may be needed or ordered today. We also reviewed her medications today. she has been encouraged to call the office with any questions or concerns that should arise related to todays visit.    No orders of the defined types were placed in this encounter.   Meds ordered this encounter  Medications   DISCONTD: buPROPion (WELLBUTRIN XL) 150 MG 24 hr tablet    Sig: Take 1 tablet (150 mg total) by mouth daily.    Dispense:  30 tablet    Refill:  2   topiramate (TOPAMAX) 25 MG tablet    Sig: Take 1 tablet (25 mg total) by mouth daily.    Dispense:  30 tablet    Refill:  1   buPROPion (WELLBUTRIN XL) 150 MG 24 hr tablet    Sig: Take 1 tablet (150 mg total) by mouth daily.    Dispense:  30 tablet    Refill:  2    Brand name only     This patient was seen by Drema Dallas, PA-C in collaboration with Dr. Clayborn Bigness as a part of collaborative care agreement.   Total time spent:30 Minutes Time spent includes review of chart, medications, test results, and follow up plan with the patient.      Dr Lavera Guise Internal medicine

## 2020-11-04 ENCOUNTER — Other Ambulatory Visit: Payer: Self-pay

## 2020-11-04 DIAGNOSIS — F409 Phobic anxiety disorder, unspecified: Secondary | ICD-10-CM

## 2020-11-04 MED ORDER — ZOLPIDEM TARTRATE ER 12.5 MG PO TBCR: 12.5000 mg | EXTENDED_RELEASE_TABLET | Freq: Every evening | ORAL | 1 refills | Status: DC | PRN

## 2020-11-04 NOTE — Telephone Encounter (Signed)
Pt called that we forgot  to send her ambien Cr as per dr Humphrey Rolls called in 63 with 1 refills

## 2020-11-24 ENCOUNTER — Other Ambulatory Visit: Payer: Self-pay

## 2020-11-24 DIAGNOSIS — R030 Elevated blood-pressure reading, without diagnosis of hypertension: Secondary | ICD-10-CM

## 2020-11-24 DIAGNOSIS — F409 Phobic anxiety disorder, unspecified: Secondary | ICD-10-CM

## 2020-11-24 DIAGNOSIS — G8929 Other chronic pain: Secondary | ICD-10-CM

## 2020-11-24 DIAGNOSIS — Z09 Encounter for follow-up examination after completed treatment for conditions other than malignant neoplasm: Secondary | ICD-10-CM

## 2020-11-24 DIAGNOSIS — G2581 Restless legs syndrome: Secondary | ICD-10-CM

## 2020-11-24 DIAGNOSIS — F5105 Insomnia due to other mental disorder: Secondary | ICD-10-CM

## 2020-11-24 DIAGNOSIS — E782 Mixed hyperlipidemia: Secondary | ICD-10-CM

## 2020-11-24 DIAGNOSIS — F411 Generalized anxiety disorder: Secondary | ICD-10-CM

## 2020-11-24 DIAGNOSIS — E039 Hypothyroidism, unspecified: Secondary | ICD-10-CM

## 2020-11-24 DIAGNOSIS — R300 Dysuria: Secondary | ICD-10-CM

## 2020-11-24 DIAGNOSIS — Z0001 Encounter for general adult medical examination with abnormal findings: Secondary | ICD-10-CM

## 2020-11-24 DIAGNOSIS — M792 Neuralgia and neuritis, unspecified: Secondary | ICD-10-CM

## 2020-11-24 DIAGNOSIS — M545 Low back pain, unspecified: Secondary | ICD-10-CM

## 2020-11-24 MED ORDER — GABAPENTIN 300 MG PO CAPS: 300.0000 mg | ORAL_CAPSULE | Freq: Two times a day (BID) | ORAL | 3 refills | Status: DC

## 2020-11-24 MED ORDER — ATORVASTATIN CALCIUM 10 MG PO TABS: 10.0000 mg | ORAL_TABLET | Freq: Every day | ORAL | 3 refills | Status: DC

## 2020-11-26 ENCOUNTER — Other Ambulatory Visit: Payer: Self-pay

## 2020-11-26 DIAGNOSIS — G8929 Other chronic pain: Secondary | ICD-10-CM

## 2020-11-26 DIAGNOSIS — M545 Low back pain, unspecified: Secondary | ICD-10-CM

## 2020-11-26 DIAGNOSIS — G2581 Restless legs syndrome: Secondary | ICD-10-CM

## 2020-11-26 MED ORDER — TIZANIDINE HCL 4 MG PO TABS: 2.0000 mg | ORAL_TABLET | Freq: Two times a day (BID) | ORAL | 3 refills | Status: DC | PRN

## 2020-11-26 MED ORDER — ROPINIROLE HCL 1 MG PO TABS
1.0000 mg | ORAL_TABLET | Freq: Every day | ORAL | 3 refills | Status: DC
Start: 2020-11-26 — End: 2020-12-30

## 2020-11-30 ENCOUNTER — Other Ambulatory Visit: Payer: Self-pay

## 2020-11-30 ENCOUNTER — Telehealth: Payer: Self-pay | Admitting: Internal Medicine

## 2020-11-30 ENCOUNTER — Encounter: Payer: Self-pay | Admitting: Internal Medicine

## 2020-11-30 VITALS — Temp 97.2°F | Ht 66.0 in | Wt 220.0 lb

## 2020-11-30 DIAGNOSIS — J301 Allergic rhinitis due to pollen: Secondary | ICD-10-CM

## 2020-11-30 DIAGNOSIS — B3731 Acute candidiasis of vulva and vagina: Secondary | ICD-10-CM

## 2020-11-30 DIAGNOSIS — J0100 Acute maxillary sinusitis, unspecified: Secondary | ICD-10-CM

## 2020-11-30 DIAGNOSIS — B373 Candidiasis of vulva and vagina: Secondary | ICD-10-CM

## 2020-11-30 MED ORDER — AMOXICILLIN-POT CLAVULANATE 875-125 MG PO TABS
1.0000 | ORAL_TABLET | Freq: Two times a day (BID) | ORAL | 0 refills | Status: DC
Start: 2020-11-30 — End: 2020-12-30

## 2020-11-30 MED ORDER — MONTELUKAST SODIUM 10 MG PO TABS: 10.0000 mg | ORAL_TABLET | Freq: Every day | ORAL | 0 refills | Status: DC

## 2020-11-30 MED ORDER — FEXOFENADINE HCL 180 MG PO TABS: 180.0000 mg | ORAL_TABLET | Freq: Every day | ORAL | 0 refills | Status: DC

## 2020-11-30 MED ORDER — FLUCONAZOLE 150 MG PO TABS: ORAL_TABLET | ORAL | 0 refills | Status: DC

## 2020-11-30 NOTE — Progress Notes (Signed)
[facility] [place], [place] [address]  Internal MEDICINE  Telephone Visit  Patient Name: Stephanie Larson  [id number]  [id number]  Date of Service: 12/08/2020  I connected with the patient at 1155 by telephone and verified the patients identity using two identifiers.   I discussed the limitations, risks, security and privacy concerns of performing an evaluation and management service by telephone and the availability of in person appointments. I also discussed with the patient that there may be a patient responsible charge related to the service.  The patient expressed understanding and agrees to proceed.    Chief Complaint  Patient presents with   Telephone Assessment    [phone]   Telephone Screen   Cough   Sinusitis    HPI  Pt is connected for sick visit  C/o nasal drainage, greenish mucus from nose, she has sinusitis in the past denies any fevers chills Headaches none  Sore throat none Not vaccinated for covid  Current Medication: Outpatient Encounter Medications as of 11/30/2020  Medication Sig Note   amoxicillin-clavulanate (AUGMENTIN) 875-125 MG tablet Take 1 tablet by mouth 2 (two) times daily.    atorvastatin (LIPITOR) 10 MG tablet Take 1 tablet (10 mg total) by mouth daily.    buPROPion (WELLBUTRIN XL) 150 MG 24 hr tablet Take 1 tablet (150 mg total) by mouth daily.    chlorhexidine (PERIDEX) 0.12 % solution Use as directed 10 mLs in the mouth or throat 2 (two) times daily.    fluconazole (DIFLUCAN) 150 MG tablet Take one tab po qd for 3 days    gabapentin (NEURONTIN) 300 MG capsule Take 1 capsule (300 mg total) by mouth 2 (two) times daily.    hydrOXYzine (ATARAX/VISTARIL) 50 MG tablet Take 1 tablet (50 mg total) by mouth 3 (three) times daily as needed.    levothyroxine (SYNTHROID) 200 MCG tablet Take 1 tablet (200 mcg total) by mouth daily before breakfast.    mometasone (ELOCON) 0.1 % cream APPLY TO AFFECTED AREAS FROM FACE TO LOWER  EXTREMITIES TWICE A DAY TO SPOT TREAT DURING FLARES 06/23/2019: As needed for Eczema   montelukast (SINGULAIR) 10 MG tablet Take 1 tablet (10 mg total) by mouth at bedtime.    rOPINIRole (REQUIP) 1 MG tablet Take 1 tablet (1 mg total) by mouth at bedtime.    topiramate (TOPAMAX) 25 MG tablet Take 1 tablet (25 mg total) by mouth daily.    zolpidem (AMBIEN CR) 12.5 MG CR tablet Take 1 tablet (12.5 mg total) by mouth at bedtime as needed for sleep.    [DISCONTINUED] fexofenadine (ALLEGRA) 180 MG tablet Take 1 tablet (180 mg total) by mouth daily.    [DISCONTINUED] tiZANidine (ZANAFLEX) 4 MG tablet Take 0.5-1 tablets (2-4 mg total) by mouth 2 (two) times daily as needed for muscle spasms.    fexofenadine (ALLEGRA) 180 MG tablet Take 1 tablet (180 mg total) by mouth daily.    No facility-administered encounter medications on file as of 11/30/2020.    Surgical History: Past Surgical History:  Procedure Laterality Date   CYSTOSCOPY N/A 05/13/2019   Procedure: CYSTOSCOPY;  Surgeon: Will Bonnet, MD;  Location: [facility];  Service: Gynecology;  Laterality: N/A;   LAPAROSCOPIC GASTRIC BANDING     TOTAL LAPAROSCOPIC HYSTERECTOMY WITH SALPINGECTOMY N/A 05/13/2019   Procedure: TOTAL LAPAROSCOPIC HYSTERECTOMY WITH BILATERAL SALPINGECTOMY;  Surgeon: Will Bonnet, MD;  Location: [facility];  Service: Gynecology;  Laterality: N/A;   TUBAL LIGATION  Medical History: Past Medical History:  Diagnosis Date   Anxiety    Depression    Family history of ovarian cancer    qualifies for cancer genetic testing   Hypothyroidism    Insomnia    Memory changes    Sinus drainage    Sleep apnea    Thyroid disease     Family History: Family History  Problem Relation Age of Onset   Colon cancer Paternal Grandfather    Ovarian cancer Paternal Grandmother 44   Hypertension Maternal Grandmother    Hypertension Father    Emphysema Father    ALS Mother    Hypertension Mother     Social History    Socioeconomic History   Marital status: Married    Spouse name: Not on file   Number of children: Not on file   Years of education: Not on file   Highest education level: Not on file  Occupational History   Not on file  Tobacco Use   Smoking status: Every Day    Packs/day: 0.50    Types: Cigarettes   Smokeless tobacco: Never  Vaping Use   Vaping Use: Never used  Substance and Sexual Activity   Alcohol use: Not Currently    Comment: occasionally   Drug use: Never   Sexual activity: Yes    Birth control/protection: None  Other Topics Concern   Not on file  Social History Narrative   Not on file   Social Determinants of Health   Financial Resource Strain: Not on file  Food Insecurity: Not on file  Transportation Needs: Not on file  Physical Activity: Not on file  Stress: Not on file  Social Connections: Not on file  Intimate Partner Violence: Not on file      Review of Systems  Constitutional:  Negative for fatigue and fever.  HENT:  Positive for rhinorrhea. Negative for congestion, mouth sores and postnasal drip.   Respiratory:  Negative for cough.   Cardiovascular:  Negative for chest pain.  Genitourinary:  Negative for flank pain.  Psychiatric/Behavioral: Negative.     Vital Signs: Temp (!) 97.2 F (36.2 C)   Ht '5\' 6"'$  (1.676 m)   Wt 220 lb (99.8 kg)   LMP 04/18/2019 (Exact Date)   BMI 35.51 kg/m    Observation/Objective: NAD Sounds congested   Assessment/Plan: 1. Acute non-recurrent maxillary sinusitis we will start therapy as prescribed. - amoxicillin-clavulanate (AUGMENTIN) 875-125 MG tablet; Take 1 tablet by mouth 2 (two) times daily.  Dispense: 20 tablet; Refill: 0  2. Seasonal allergic rhinitis due to pollen Patient has ongoing symptoms for allergic rhinitis we will add Singulair 10 mg once a day continue on Allegra as before - montelukast (SINGULAIR) 10 MG tablet; Take 1 tablet (10 mg total) by mouth at bedtime.  Dispense: 90 tablet;  Refill: 0 - fexofenadine (ALLEGRA) 180 MG tablet; Take 1 tablet (180 mg total) by mouth daily.  Dispense: 90 tablet; Refill: 0  3. Vaginal yeast infection Patient is requesting DIFLUCAN in case she develops a yeast infection after antibiotic use - fluconazole (DIFLUCAN) 150 MG tablet; Take one tab po qd for 3 days  Dispense: 3 tablet; Refill: 0   General Counseling: Yohana verbalizes understanding of the findings of today's phone visit and agrees with plan of treatment. I have discussed any further diagnostic evaluation that may be needed or ordered today. We also reviewed her medications today. she has been encouraged to call the office with any questions or concerns that  should arise related to todays visit.    No orders of the defined types were placed in this encounter.   Meds ordered this encounter  Medications   amoxicillin-clavulanate (AUGMENTIN) 875-125 MG tablet    Sig: Take 1 tablet by mouth 2 (two) times daily.    Dispense:  20 tablet    Refill:  0   fluconazole (DIFLUCAN) 150 MG tablet    Sig: Take one tab po qd for 3 days    Dispense:  3 tablet    Refill:  0   montelukast (SINGULAIR) 10 MG tablet    Sig: Take 1 tablet (10 mg total) by mouth at bedtime.    Dispense:  90 tablet    Refill:  0   fexofenadine (ALLEGRA) 180 MG tablet    Sig: Take 1 tablet (180 mg total) by mouth daily.    Dispense:  90 tablet    Refill:  0    Time spent:15 Minutes    Dr Lavera Guise Internal medicine

## 2020-12-03 ENCOUNTER — Other Ambulatory Visit: Payer: Self-pay

## 2020-12-03 DIAGNOSIS — G8929 Other chronic pain: Secondary | ICD-10-CM

## 2020-12-03 MED ORDER — TIZANIDINE HCL 4 MG PO TABS: 2.0000 mg | ORAL_TABLET | Freq: Two times a day (BID) | ORAL | 3 refills | Status: DC | PRN

## 2020-12-23 ENCOUNTER — Other Ambulatory Visit: Payer: Self-pay

## 2020-12-23 DIAGNOSIS — E669 Obesity, unspecified: Secondary | ICD-10-CM

## 2020-12-23 MED ORDER — TOPIRAMATE 25 MG PO TABS: 25.0000 mg | ORAL_TABLET | Freq: Every day | ORAL | 1 refills | Status: DC

## 2020-12-30 ENCOUNTER — Encounter: Payer: Self-pay | Admitting: Nurse Practitioner

## 2020-12-30 ENCOUNTER — Other Ambulatory Visit: Payer: Self-pay

## 2020-12-30 ENCOUNTER — Ambulatory Visit: Payer: Self-pay | Admitting: Nurse Practitioner

## 2020-12-30 VITALS — BP 124/76 | HR 84 | Temp 98.2°F | Resp 16 | Ht 66.0 in | Wt 220.2 lb

## 2020-12-30 DIAGNOSIS — J301 Allergic rhinitis due to pollen: Secondary | ICD-10-CM

## 2020-12-30 DIAGNOSIS — F411 Generalized anxiety disorder: Secondary | ICD-10-CM

## 2020-12-30 DIAGNOSIS — E782 Mixed hyperlipidemia: Secondary | ICD-10-CM

## 2020-12-30 DIAGNOSIS — F5105 Insomnia due to other mental disorder: Secondary | ICD-10-CM

## 2020-12-30 DIAGNOSIS — G8929 Other chronic pain: Secondary | ICD-10-CM

## 2020-12-30 DIAGNOSIS — Z760 Encounter for issue of repeat prescription: Secondary | ICD-10-CM

## 2020-12-30 DIAGNOSIS — M792 Neuralgia and neuritis, unspecified: Secondary | ICD-10-CM

## 2020-12-30 DIAGNOSIS — E039 Hypothyroidism, unspecified: Secondary | ICD-10-CM

## 2020-12-30 DIAGNOSIS — R030 Elevated blood-pressure reading, without diagnosis of hypertension: Secondary | ICD-10-CM

## 2020-12-30 DIAGNOSIS — F409 Phobic anxiety disorder, unspecified: Secondary | ICD-10-CM

## 2020-12-30 DIAGNOSIS — M545 Low back pain, unspecified: Secondary | ICD-10-CM

## 2020-12-30 DIAGNOSIS — Z0001 Encounter for general adult medical examination with abnormal findings: Secondary | ICD-10-CM

## 2020-12-30 DIAGNOSIS — K047 Periapical abscess without sinus: Secondary | ICD-10-CM

## 2020-12-30 DIAGNOSIS — G2581 Restless legs syndrome: Secondary | ICD-10-CM

## 2020-12-30 DIAGNOSIS — R300 Dysuria: Secondary | ICD-10-CM

## 2020-12-30 DIAGNOSIS — Z09 Encounter for follow-up examination after completed treatment for conditions other than malignant neoplasm: Secondary | ICD-10-CM

## 2020-12-30 MED ORDER — MONTELUKAST SODIUM 10 MG PO TABS: 10.0000 mg | ORAL_TABLET | Freq: Every day | ORAL | 0 refills | Status: DC

## 2020-12-30 MED ORDER — ATORVASTATIN CALCIUM 10 MG PO TABS: 10.0000 mg | ORAL_TABLET | Freq: Every day | ORAL | 3 refills | Status: DC

## 2020-12-30 MED ORDER — ROPINIROLE HCL 1 MG PO TABS: 1.0000 mg | ORAL_TABLET | Freq: Every day | ORAL | 3 refills | Status: DC

## 2020-12-30 MED ORDER — ZOLPIDEM TARTRATE ER 12.5 MG PO TBCR: 12.5000 mg | EXTENDED_RELEASE_TABLET | Freq: Every evening | ORAL | 1 refills | Status: DC | PRN

## 2020-12-30 MED ORDER — CHLORHEXIDINE GLUCONATE 0.12 % MT SOLN: 10.0000 mL | Freq: Two times a day (BID) | OROMUCOSAL | 1 refills | Status: DC

## 2020-12-30 MED ORDER — HYDROXYZINE HCL 50 MG PO TABS: 50.0000 mg | ORAL_TABLET | Freq: Three times a day (TID) | ORAL | 3 refills | Status: DC | PRN

## 2020-12-30 MED ORDER — TIZANIDINE HCL 4 MG PO TABS: 2.0000 mg | ORAL_TABLET | Freq: Two times a day (BID) | ORAL | 3 refills | Status: DC | PRN

## 2020-12-30 MED ORDER — GABAPENTIN 300 MG PO CAPS: 300.0000 mg | ORAL_CAPSULE | Freq: Two times a day (BID) | ORAL | 3 refills | Status: DC

## 2020-12-30 MED ORDER — FEXOFENADINE HCL 180 MG PO TABS: 180.0000 mg | ORAL_TABLET | Freq: Every day | ORAL | 0 refills | Status: DC

## 2020-12-30 MED ORDER — LEVOTHYROXINE SODIUM 200 MCG PO TABS: 200.0000 ug | ORAL_TABLET | Freq: Every day | ORAL | 2 refills | Status: DC

## 2020-12-30 MED ORDER — ZOLPIDEM TARTRATE ER 12.5 MG PO TBCR: 12.5000 mg | EXTENDED_RELEASE_TABLET | Freq: Every evening | ORAL | 2 refills | Status: DC | PRN

## 2020-12-30 NOTE — Progress Notes (Signed)
[facility] [place], [place] [address]  Internal MEDICINE  Office Visit Note  Patient Name: Stephanie Larson  [id number]  [id number]  Date of Service: 12/30/2020  Chief Complaint  Patient presents with   Follow-up    refills   Depression   Anxiety   Hypothyroidism   Weight Loss    HPI Darleny presents for follow up visit for medication review and refills. She has a history of hypothyroidism, anxiety and depression. She takes [place] for sleep and it works well for her.  She takes hydroxyzine for anxiety as needed. She is not currently on any medication for depression and she denies any depressive symptoms.     Current Medication: Outpatient Encounter Medications as of 12/30/2020  Medication Sig Note   mometasone (ELOCON) 0.1 % cream APPLY TO AFFECTED AREAS FROM FACE TO LOWER EXTREMITIES TWICE A DAY TO SPOT TREAT DURING FLARES 06/23/2019: As needed for Eczema   [DISCONTINUED] atorvastatin (LIPITOR) 10 MG tablet Take 1 tablet (10 mg total) by mouth daily.    [DISCONTINUED] chlorhexidine (PERIDEX) 0.12 % solution Use as directed 10 mLs in the mouth or throat 2 (two) times daily.    [DISCONTINUED] fexofenadine (ALLEGRA) 180 MG tablet Take 1 tablet (180 mg total) by mouth daily.    [DISCONTINUED] fluconazole (DIFLUCAN) 150 MG tablet Take one tab po qd for 3 days    [DISCONTINUED] gabapentin (NEURONTIN) 300 MG capsule Take 1 capsule (300 mg total) by mouth 2 (two) times daily.    [DISCONTINUED] hydrOXYzine (ATARAX/VISTARIL) 50 MG tablet Take 1 tablet (50 mg total) by mouth 3 (three) times daily as needed.    [DISCONTINUED] levothyroxine (SYNTHROID) 200 MCG tablet Take 1 tablet (200 mcg total) by mouth daily before breakfast.    [DISCONTINUED] montelukast (SINGULAIR) 10 MG tablet Take 1 tablet (10 mg total) by mouth at bedtime.    [DISCONTINUED] rOPINIRole (REQUIP) 1 MG tablet Take 1 tablet (1 mg total) by mouth at bedtime.    [DISCONTINUED] tiZANidine (ZANAFLEX) 4 MG  tablet Take 0.5-1 tablets (2-4 mg total) by mouth 2 (two) times daily as needed for muscle spasms.    [DISCONTINUED] zolpidem (AMBIEN CR) 12.5 MG CR tablet Take 1 tablet (12.5 mg total) by mouth at bedtime as needed for sleep.    atorvastatin (LIPITOR) 10 MG tablet Take 1 tablet (10 mg total) by mouth daily.    chlorhexidine (PERIDEX) 0.12 % solution Use as directed 10 mLs in the mouth or throat 2 (two) times daily.    fexofenadine (ALLEGRA) 180 MG tablet Take 1 tablet (180 mg total) by mouth daily.    gabapentin (NEURONTIN) 300 MG capsule Take 1 capsule (300 mg total) by mouth 2 (two) times daily.    hydrOXYzine (ATARAX/VISTARIL) 50 MG tablet Take 1 tablet (50 mg total) by mouth 3 (three) times daily as needed.    levothyroxine (SYNTHROID) 200 MCG tablet Take 1 tablet (200 mcg total) by mouth daily before breakfast.    montelukast (SINGULAIR) 10 MG tablet Take 1 tablet (10 mg total) by mouth at bedtime.    rOPINIRole (REQUIP) 1 MG tablet Take 1 tablet (1 mg total) by mouth at bedtime.    tiZANidine (ZANAFLEX) 4 MG tablet Take 0.5-1 tablets (2-4 mg total) by mouth 2 (two) times daily as needed for muscle spasms.    zolpidem (AMBIEN CR) 12.5 MG CR tablet Take 1 tablet (12.5 mg total) by mouth at bedtime as needed for sleep.    [DISCONTINUED] amoxicillin-clavulanate (AUGMENTIN) 875-125  MG tablet Take 1 tablet by mouth 2 (two) times daily. (Patient not taking: Reported on 12/30/2020)    [DISCONTINUED] buPROPion (WELLBUTRIN XL) 150 MG 24 hr tablet Take 1 tablet (150 mg total) by mouth daily. (Patient not taking: Reported on 12/30/2020)    [DISCONTINUED] topiramate (TOPAMAX) 25 MG tablet Take 1 tablet (25 mg total) by mouth daily. (Patient not taking: Reported on 12/30/2020)    [DISCONTINUED] zolpidem (AMBIEN CR) 12.5 MG CR tablet Take 1 tablet (12.5 mg total) by mouth at bedtime as needed for sleep.    No facility-administered encounter medications on file as of 12/30/2020.    Surgical History: Past  Surgical History:  Procedure Laterality Date   CYSTOSCOPY N/A 05/13/2019   Procedure: CYSTOSCOPY;  Surgeon: Will Bonnet, MD;  Location: [facility] ORS;  Service: Gynecology;  Laterality: N/A;   LAPAROSCOPIC GASTRIC BANDING     TOTAL LAPAROSCOPIC HYSTERECTOMY WITH SALPINGECTOMY N/A 05/13/2019   Procedure: TOTAL LAPAROSCOPIC HYSTERECTOMY WITH BILATERAL SALPINGECTOMY;  Surgeon: Will Bonnet, MD;  Location: [facility] ORS;  Service: Gynecology;  Laterality: N/A;   TUBAL LIGATION      Medical History: Past Medical History:  Diagnosis Date   Anxiety    Depression    Family history of ovarian cancer    qualifies for cancer genetic testing   Hypothyroidism    Insomnia    Memory changes    Sinus drainage    Sleep apnea    Thyroid disease     Family History: Family History  Problem Relation Age of Onset   Colon cancer Paternal Grandfather    Ovarian cancer Paternal Grandmother 84   Hypertension Maternal Grandmother    Hypertension Father    Emphysema Father    ALS Mother    Hypertension Mother     Social History   Socioeconomic History   Marital status: Married    Spouse name: Not on file   Number of children: Not on file   Years of education: Not on file   Highest education level: Not on file  Occupational History   Not on file  Tobacco Use   Smoking status: Every Day    Packs/day: 0.50    Types: Cigarettes   Smokeless tobacco: Never  Vaping Use   Vaping Use: Never used  Substance and Sexual Activity   Alcohol use: Not Currently    Comment: occasionally   Drug use: Never   Sexual activity: Yes    Birth control/protection: None  Other Topics Concern   Not on file  Social History Narrative   Not on file   Social Determinants of Health   Financial Resource Strain: Not on file  Food Insecurity: Not on file  Transportation Needs: Not on file  Physical Activity: Not on file  Stress: Not on file  Social Connections: Not on file  Intimate Partner Violence: Not  on file      Review of Systems  Constitutional:  Negative for chills, fatigue and unexpected weight change.  HENT:  Negative for congestion, rhinorrhea, sneezing and sore throat.   Eyes:  Negative for redness.  Respiratory:  Negative for cough, chest tightness and shortness of breath.   Cardiovascular:  Negative for chest pain and palpitations.  Gastrointestinal:  Negative for abdominal pain, constipation, diarrhea, nausea and vomiting.  Genitourinary:  Negative for dysuria and frequency.  Musculoskeletal:  Negative for arthralgias, back pain, joint swelling and neck pain.  Skin:  Negative for rash.  Neurological: Negative.  Negative for tremors and numbness.  Hematological:  Negative for adenopathy. Does not bruise/bleed easily.  Psychiatric/Behavioral:  Negative for behavioral problems (Depression), sleep disturbance and suicidal ideas. The patient is not nervous/anxious.    Vital Signs: BP 124/76   Pulse 84   Temp 98.2 F (36.8 C)   Resp 16   Ht '5\' 6"'$  (1.676 m)   Wt 220 lb 3.2 oz (99.9 kg)   LMP 04/18/2019 (Exact Date)   SpO2 99%   BMI 35.54 kg/m    Physical Exam Vitals reviewed.  Constitutional:      General: She is not in acute distress.    Appearance: Normal appearance. She is obese. She is not ill-appearing.  HENT:     Head: Normocephalic and atraumatic.  Eyes:     Extraocular Movements: Extraocular movements intact.     Pupils: Pupils are equal, round, and reactive to light.  Cardiovascular:     Rate and Rhythm: Normal rate and regular rhythm.  Pulmonary:     Effort: Pulmonary effort is normal. No respiratory distress.  Neurological:     Mental Status: She is alert and oriented to person, place, and time.     Cranial Nerves: No cranial nerve deficit.     Coordination: Coordination normal.     Gait: Gait normal.  Psychiatric:        Mood and Affect: Mood normal.        Behavior: Behavior normal.       Assessment/Plan: 1. Acquired  hypothyroidism Continue as prescribed, may need to check thyroid levels soon, last labs were done in January 2022.  - levothyroxine (SYNTHROID) 200 MCG tablet; Take 1 tablet (200 mcg total) by mouth daily before breakfast.  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 - zolpidem (AMBIEN CR) 12.5 MG CR tablet; Take 1 tablet (12.5 mg total) by mouth at bedtime as needed for sleep.  Dispense: 30 tablet; Refill: 2  3. GAD (generalized anxiety disorder) Stable, no changes, continue as prescribed - hydrOXYzine (ATARAX/VISTARIL) 50 MG tablet; Take 1 tablet (50 mg total) by mouth 3 (three) times daily as needed.  Dispense: 90 tablet; Refill: 3  4. Restless leg syndrome Stable , continue as prescribed - rOPINIRole (REQUIP) 1 MG tablet; Take 1 tablet (1 mg total) by mouth at bedtime.  Dispense: 30 tablet; Refill: 3  5. Chronic midline low back pain without sciatica Stable with current medication, refill ordered - tiZANidine (ZANAFLEX) 4 MG tablet; Take 0.5-1 tablets (2-4 mg total) by mouth 2 (two) times daily as needed for muscle spasms.  Dispense: 60 tablet; Refill: 3  6. Encounter for medication refill All refills sent to pharmacy - chlorhexidine (PERIDEX) 0.12 % solution; Use as directed 10 mLs in the mouth or throat 2 (two) times daily.  Dispense: 120 mL; Refill: 1 - atorvastatin (LIPITOR) 10 MG tablet; Take 1 tablet (10 mg total) by mouth daily.  Dispense: 30 tablet; Refill: 3 - montelukast (SINGULAIR) 10 MG tablet; Take 1 tablet (10 mg total) by mouth at bedtime.  Dispense: 90 tablet; Refill: 0 - fexofenadine (ALLEGRA) 180 MG tablet; Take 1 tablet (180 mg total) by mouth daily.  Dispense: 90 tablet; Refill: 0 - gabapentin (NEURONTIN) 300 MG capsule; Take 1 capsule (300 mg total) by mouth 2 (two) times daily.  Dispense: 60 capsule; Refill: 3   General Counseling: Priya verbalizes understanding of the findings of  todays visit and agrees with plan of treatment. I have  discussed any further diagnostic evaluation that may be needed or ordered today. We also reviewed her medications today. she has been encouraged to call the office with any questions or concerns that should arise related to todays visit.    No orders of the defined types were placed in this encounter.   Meds ordered this encounter  Medications   hydrOXYzine (ATARAX/VISTARIL) 50 MG tablet    Sig: Take 1 tablet (50 mg total) by mouth 3 (three) times daily as needed.    Dispense:  90 tablet    Refill:  3   levothyroxine (SYNTHROID) 200 MCG tablet    Sig: Take 1 tablet (200 mcg total) by mouth daily before breakfast.    Dispense:  30 tablet    Refill:  2   DISCONTD: zolpidem (AMBIEN CR) 12.5 MG CR tablet    Sig: Take 1 tablet (12.5 mg total) by mouth at bedtime as needed for sleep.    Dispense:  30 tablet    Refill:  1   chlorhexidine (PERIDEX) 0.12 % solution    Sig: Use as directed 10 mLs in the mouth or throat 2 (two) times daily.    Dispense:  120 mL    Refill:  1   atorvastatin (LIPITOR) 10 MG tablet    Sig: Take 1 tablet (10 mg total) by mouth daily.    Dispense:  30 tablet    Refill:  3   montelukast (SINGULAIR) 10 MG tablet    Sig: Take 1 tablet (10 mg total) by mouth at bedtime.    Dispense:  90 tablet    Refill:  0   fexofenadine (ALLEGRA) 180 MG tablet    Sig: Take 1 tablet (180 mg total) by mouth daily.    Dispense:  90 tablet    Refill:  0   tiZANidine (ZANAFLEX) 4 MG tablet    Sig: Take 0.5-1 tablets (2-4 mg total) by mouth 2 (two) times daily as needed for muscle spasms.    Dispense:  60 tablet    Refill:  3   gabapentin (NEURONTIN) 300 MG capsule    Sig: Take 1 capsule (300 mg total) by mouth 2 (two) times daily.    Dispense:  60 capsule    Refill:  3   rOPINIRole (REQUIP) 1 MG tablet    Sig: Take 1 tablet (1 mg total) by mouth at bedtime.    Dispense:  30 tablet    Refill:  3   zolpidem (AMBIEN CR)  12.5 MG CR tablet    Sig: Take 1 tablet (12.5 mg total) by mouth at bedtime as needed for sleep.    Dispense:  30 tablet    Refill:  2    Return in about 3 months (around 03/31/2021) for F/U, med refill with Lauren PA-C.   Total time spent:30 Minutes Time spent includes review of chart, medications, test results, and follow up plan with the patient.   [place] Controlled Substance Database was reviewed by me.  This patient was seen by Jonetta Osgood, FNP-C in collaboration with Dr. Clayborn Bigness as a part of collaborative care agreement.   Tywanda Rice R. Valetta Fuller, MSN, FNP-C Internal medicine

## 2021-01-31 ENCOUNTER — Other Ambulatory Visit: Payer: Self-pay

## 2021-02-02 ENCOUNTER — Other Ambulatory Visit: Payer: Self-pay

## 2021-02-02 DIAGNOSIS — E039 Hypothyroidism, unspecified: Secondary | ICD-10-CM

## 2021-02-02 MED ORDER — LEVOTHYROXINE SODIUM 200 MCG PO TABS: 200.0000 ug | ORAL_TABLET | Freq: Every day | ORAL | 2 refills | Status: DC

## 2021-03-07 ENCOUNTER — Other Ambulatory Visit: Payer: Self-pay | Admitting: Physician Assistant

## 2021-03-27 ENCOUNTER — Other Ambulatory Visit: Payer: Self-pay | Admitting: Internal Medicine

## 2021-03-27 DIAGNOSIS — Z760 Encounter for issue of repeat prescription: Secondary | ICD-10-CM

## 2021-03-28 ENCOUNTER — Other Ambulatory Visit: Payer: Self-pay

## 2021-03-28 ENCOUNTER — Ambulatory Visit (INDEPENDENT_AMBULATORY_CARE_PROVIDER_SITE_OTHER): Payer: Self-pay | Admitting: Physician Assistant

## 2021-03-28 ENCOUNTER — Encounter: Payer: Self-pay | Admitting: Physician Assistant

## 2021-03-28 DIAGNOSIS — G8929 Other chronic pain: Secondary | ICD-10-CM

## 2021-03-28 DIAGNOSIS — E039 Hypothyroidism, unspecified: Secondary | ICD-10-CM

## 2021-03-28 DIAGNOSIS — F409 Phobic anxiety disorder, unspecified: Secondary | ICD-10-CM

## 2021-03-28 DIAGNOSIS — E782 Mixed hyperlipidemia: Secondary | ICD-10-CM

## 2021-03-28 DIAGNOSIS — F411 Generalized anxiety disorder: Secondary | ICD-10-CM

## 2021-03-28 DIAGNOSIS — R5383 Other fatigue: Secondary | ICD-10-CM

## 2021-03-28 DIAGNOSIS — G2581 Restless legs syndrome: Secondary | ICD-10-CM

## 2021-03-28 DIAGNOSIS — F5105 Insomnia due to other mental disorder: Secondary | ICD-10-CM

## 2021-03-28 DIAGNOSIS — Z760 Encounter for issue of repeat prescription: Secondary | ICD-10-CM

## 2021-03-28 DIAGNOSIS — M545 Low back pain, unspecified: Secondary | ICD-10-CM

## 2021-03-28 MED ORDER — LEVOTHYROXINE SODIUM 200 MCG PO TABS: 200.0000 ug | ORAL_TABLET | Freq: Every day | ORAL | 2 refills | Status: DC

## 2021-03-28 MED ORDER — ATORVASTATIN CALCIUM 10 MG PO TABS: 10.0000 mg | ORAL_TABLET | Freq: Every day | ORAL | 3 refills | Status: DC

## 2021-03-28 MED ORDER — ZOLPIDEM TARTRATE ER 12.5 MG PO TBCR: 12.5000 mg | EXTENDED_RELEASE_TABLET | Freq: Every evening | ORAL | 2 refills | Status: DC | PRN

## 2021-03-28 MED ORDER — ROPINIROLE HCL 1 MG PO TABS: 1.0000 mg | ORAL_TABLET | Freq: Every day | ORAL | 2 refills | Status: DC

## 2021-03-28 MED ORDER — TIZANIDINE HCL 4 MG PO TABS: 2.0000 mg | ORAL_TABLET | Freq: Two times a day (BID) | ORAL | 2 refills | Status: DC | PRN

## 2021-03-28 MED ORDER — HYDROXYZINE HCL 50 MG PO TABS: 50.0000 mg | ORAL_TABLET | Freq: Three times a day (TID) | ORAL | 2 refills | Status: DC | PRN

## 2021-03-28 NOTE — Progress Notes (Signed)
[facility] [place], [place] [address]  Internal MEDICINE  Office Visit Note  Patient Name: Stephanie Larson  [id number]  [id number]  Date of Service: 03/29/2021  Chief Complaint  Patient presents with   Follow-up   Depression   Anxiety    HPI Pt is here for routine follow up and medication refill -Stopped smoking 1 month ago and is feeling good with this. States she made a deal with her husband and this motivated her to quit completely since she was going to be having dental work done and he wanted her to quit before this. -Sleeping well as long as she takes her medications -Anxiety also well controlled with her medications -BP stable and tolerating statin -Needs refills of all meds today -Has upcoming CPE, will order routine fasting labs today to be done prior to next visit  Current Medication: Outpatient Encounter Medications as of 03/28/2021  Medication Sig Note   chlorhexidine (PERIDEX) 0.12 % solution Use as directed 10 mLs in the mouth or throat 2 (two) times daily.    fexofenadine (ALLEGRA) 180 MG tablet Take 1 tablet (180 mg total) by mouth daily.    gabapentin (NEURONTIN) 300 MG capsule TAKE ONE CAPSULE BY MOUTH TWICE DAILY    mometasone (ELOCON) 0.1 % cream APPLY TO AFFECTED AREAS FROM FACE TO LOWER EXTREMITIES TWICE A DAY TO SPOT TREAT DURING FLARES 06/23/2019: As needed for Eczema   montelukast (SINGULAIR) 10 MG tablet Take 1 tablet (10 mg total) by mouth at bedtime.    [DISCONTINUED] atorvastatin (LIPITOR) 10 MG tablet Take 1 tablet (10 mg total) by mouth daily.    [DISCONTINUED] hydrOXYzine (ATARAX/VISTARIL) 50 MG tablet Take 1 tablet (50 mg total) by mouth 3 (three) times daily as needed.    [DISCONTINUED] levothyroxine (SYNTHROID) 200 MCG tablet Take 1 tablet (200 mcg total) by mouth daily before breakfast.    [DISCONTINUED] rOPINIRole (REQUIP) 1 MG tablet Take 1 tablet (1 mg total) by mouth at bedtime.    [DISCONTINUED] tiZANidine (ZANAFLEX)  4 MG tablet Take 0.5-1 tablets (2-4 mg total) by mouth 2 (two) times daily as needed for muscle spasms.    [DISCONTINUED] zolpidem (AMBIEN CR) 12.5 MG CR tablet Take 1 tablet (12.5 mg total) by mouth at bedtime as needed for sleep.    atorvastatin (LIPITOR) 10 MG tablet Take 1 tablet (10 mg total) by mouth daily.    hydrOXYzine (ATARAX/VISTARIL) 50 MG tablet Take 1 tablet (50 mg total) by mouth 3 (three) times daily as needed.    levothyroxine (SYNTHROID) 200 MCG tablet Take 1 tablet (200 mcg total) by mouth daily before breakfast.    rOPINIRole (REQUIP) 1 MG tablet Take 1 tablet (1 mg total) by mouth at bedtime.    tiZANidine (ZANAFLEX) 4 MG tablet Take 0.5-1 tablets (2-4 mg total) by mouth 2 (two) times daily as needed for muscle spasms.    zolpidem (AMBIEN CR) 12.5 MG CR tablet Take 1 tablet (12.5 mg total) by mouth at bedtime as needed for sleep.    No facility-administered encounter medications on file as of 03/28/2021.    Surgical History: Past Surgical History:  Procedure Laterality Date   CYSTOSCOPY N/A 05/13/2019   Procedure: CYSTOSCOPY;  Surgeon: Will Bonnet, MD;  Location: [facility] ORS;  Service: Gynecology;  Laterality: N/A;   LAPAROSCOPIC GASTRIC BANDING     TOTAL LAPAROSCOPIC HYSTERECTOMY WITH SALPINGECTOMY N/A 05/13/2019   Procedure: TOTAL LAPAROSCOPIC HYSTERECTOMY WITH BILATERAL SALPINGECTOMY;  Surgeon: Will Bonnet, MD;  Location: [facility] ORS;  Service: Gynecology;  Laterality: N/A;   TUBAL LIGATION      Medical History: Past Medical History:  Diagnosis Date   Anxiety    Depression    Family history of ovarian cancer    qualifies for cancer genetic testing   Hypothyroidism    Insomnia    Memory changes    Sinus drainage    Sleep apnea    Thyroid disease     Family History: Family History  Problem Relation Age of Onset   Colon cancer Paternal Grandfather    Ovarian cancer Paternal Grandmother 64   Hypertension Maternal Grandmother    Hypertension Father     Emphysema Father    ALS Mother    Hypertension Mother     Social History   Socioeconomic History   Marital status: Married    Spouse name: Not on file   Number of children: Not on file   Years of education: Not on file   Highest education level: Not on file  Occupational History   Not on file  Tobacco Use   Smoking status: Former    Packs/day: 0.50    Types: Cigarettes   Smokeless tobacco: Never   Tobacco comments:    Pt quit 1 month ago 10/22  Vaping Use   Vaping Use: Never used  Substance and Sexual Activity   Alcohol use: Not Currently    Comment: occasionally   Drug use: Never   Sexual activity: Yes    Birth control/protection: None  Other Topics Concern   Not on file  Social History Narrative   Not on file   Social Determinants of Health   Financial Resource Strain: Not on file  Food Insecurity: Not on file  Transportation Needs: Not on file  Physical Activity: Not on file  Stress: Not on file  Social Connections: Not on file  Intimate Partner Violence: Not on file      Review of Systems  Constitutional:  Negative for chills, fatigue and unexpected weight change.  HENT:  Negative for congestion, rhinorrhea, sneezing and sore throat.   Eyes:  Negative for redness.  Respiratory:  Negative for cough, chest tightness and shortness of breath.   Cardiovascular:  Negative for chest pain and palpitations.  Gastrointestinal:  Negative for abdominal pain, constipation, diarrhea, nausea and vomiting.  Genitourinary:  Negative for dysuria and frequency.  Musculoskeletal:  Negative for arthralgias, back pain, joint swelling and neck pain.  Skin:  Negative for rash.  Neurological: Negative.  Negative for tremors and numbness.  Hematological:  Negative for adenopathy. Does not bruise/bleed easily.  Psychiatric/Behavioral:  Negative for behavioral problems (Depression), sleep disturbance and suicidal ideas. The patient is not nervous/anxious.    Vital Signs: BP  128/78   Pulse 75   Temp 97.8 F (36.6 C)   Resp 16   Ht 5\' 6"  (1.676 m)   Wt 221 lb (100.2 kg)   LMP 04/18/2019 (Exact Date)   SpO2 97%   BMI 35.67 kg/m    Physical Exam Vitals reviewed.  Constitutional:      General: She is not in acute distress.    Appearance: Normal appearance. She is obese. She is not ill-appearing.  HENT:     Head: Normocephalic and atraumatic.  Eyes:     Extraocular Movements: Extraocular movements intact.     Pupils: Pupils are equal, round, and reactive to light.  Cardiovascular:     Rate and Rhythm: Normal rate and regular rhythm.  Pulmonary:     Effort: Pulmonary effort is normal. No respiratory distress.  Skin:    General: Skin is warm and dry.  Neurological:     Mental Status: She is alert and oriented to person, place, and time.     Cranial Nerves: No cranial nerve deficit.     Coordination: Coordination normal.     Gait: Gait normal.  Psychiatric:        Mood and Affect: Mood normal.        Behavior: Behavior normal.       Assessment/Plan: 1. Insomnia due to anxiety and fear May continue ambien as needed - zolpidem (AMBIEN CR) 12.5 MG CR tablet; Take 1 tablet (12.5 mg total) by mouth at bedtime as needed for sleep.  Dispense: 30 tablet; Refill: 2  2. Acquired hypothyroidism Will update labs, continue synthroid - levothyroxine (SYNTHROID) 200 MCG tablet; Take 1 tablet (200 mcg total) by mouth daily before breakfast.  Dispense: 30 tablet; Refill: 2 - TSH + free T4  3. GAD (generalized anxiety disorder) Stable, may continue hydroxyzine - hydrOXYzine (ATARAX/VISTARIL) 50 MG tablet; Take 1 tablet (50 mg total) by mouth 3 (three) times daily as needed.  Dispense: 90 tablet; Refill: 2  4. Restless leg syndrome - rOPINIRole (REQUIP) 1 MG tablet; Take 1 tablet (1 mg total) by mouth at bedtime.  Dispense: 30 tablet; Refill: 2  5. Chronic midline low back pain without sciatica - tiZANidine (ZANAFLEX) 4 MG tablet; Take 0.5-1 tablets (2-4  mg total) by mouth 2 (two) times daily as needed for muscle spasms.  Dispense: 60 tablet; Refill: 2  6. Encounter for medication refill Medication refills sent today  7. Mixed hyperlipidemia Will update labs and continue statin - atorvastatin (LIPITOR) 10 MG tablet; Take 1 tablet (10 mg total) by mouth daily.  Dispense: 30 tablet; Refill: 3 - Lipid Panel With LDL/HDL Ratio  8. Other fatigue - Comprehensive metabolic panel - Lipid Panel With LDL/HDL Ratio - CBC w/Diff/Platelet   General Counseling: Maydelin verbalizes understanding of the findings of todays visit and agrees with plan of treatment. I have discussed any further diagnostic evaluation that may be needed or ordered today. We also reviewed her medications today. she has been encouraged to call the office with any questions or concerns that should arise related to todays visit.    Orders Placed This Encounter  Procedures   TSH + free T4   Comprehensive metabolic panel   Lipid Panel With LDL/HDL Ratio   CBC w/Diff/Platelet    Meds ordered this encounter  Medications   zolpidem (AMBIEN CR) 12.5 MG CR tablet    Sig: Take 1 tablet (12.5 mg total) by mouth at bedtime as needed for sleep.    Dispense:  30 tablet    Refill:  2   rOPINIRole (REQUIP) 1 MG tablet    Sig: Take 1 tablet (1 mg total) by mouth at bedtime.    Dispense:  30 tablet    Refill:  2   levothyroxine (SYNTHROID) 200 MCG tablet    Sig: Take 1 tablet (200 mcg total) by mouth daily before breakfast.    Dispense:  30 tablet    Refill:  2   hydrOXYzine (ATARAX/VISTARIL) 50 MG tablet    Sig: Take 1 tablet (50 mg total) by mouth 3 (three) times daily as needed.    Dispense:  90 tablet    Refill:  2   atorvastatin (LIPITOR) 10 MG tablet    Sig: Take 1 tablet (  10 mg total) by mouth daily.    Dispense:  30 tablet    Refill:  3   tiZANidine (ZANAFLEX) 4 MG tablet    Sig: Take 0.5-1 tablets (2-4 mg total) by mouth 2 (two) times daily as needed for muscle spasms.     Dispense:  60 tablet    Refill:  2    This patient was seen by Drema Dallas, PA-C in collaboration with Dr. Clayborn Bigness as a part of collaborative care agreement.   Total time spent:30 Minutes Time spent includes review of chart, medications, test results, and follow up plan with the patient.      Dr Lavera Guise Internal medicine

## 2021-05-04 IMAGING — US US PELVIS COMPLETE WITH TRANSVAGINAL
1 series · 14 of 25 positions shown · non-contrast
Comparison: None

CLINICAL DATA: Heavy prolonged menses

EXAM:
TRANSABDOMINAL AND TRANSVAGINAL ULTRASOUND OF PELVIS
TECHNIQUE: Both transabdominal and transvaginal ultrasound examinations of the
pelvis were performed. Transabdominal technique was performed for
global imaging of the pelvis including uterus, ovaries, adnexal
regions, and pelvic cul-de-sac. It was necessary to proceed with
endovaginal exam following the transabdominal exam to visualize the
uterus endometrium and ovaries.

[Series 1: us pelvis complete with transvaginal · 0.26mm/px · 14 of 91 slices shown]
[im 1/91]
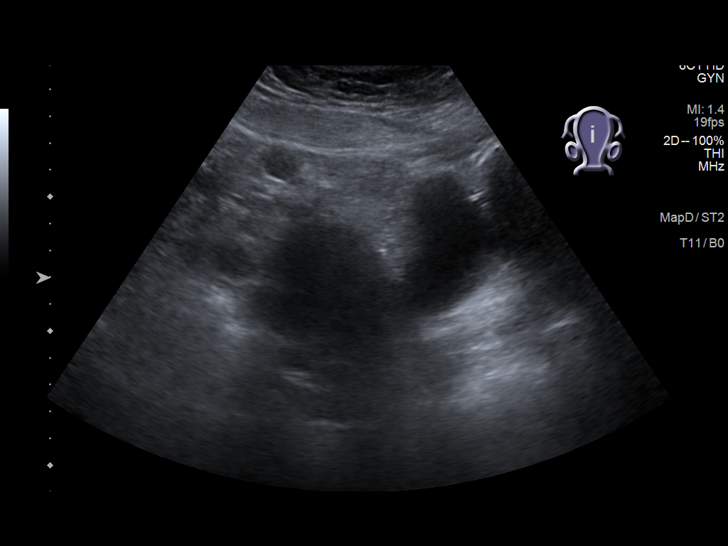
[im 8/91]
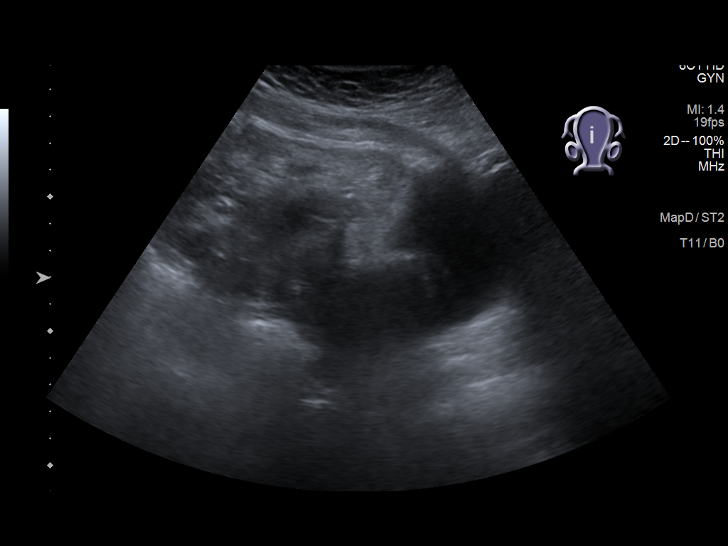
[im 16/91]
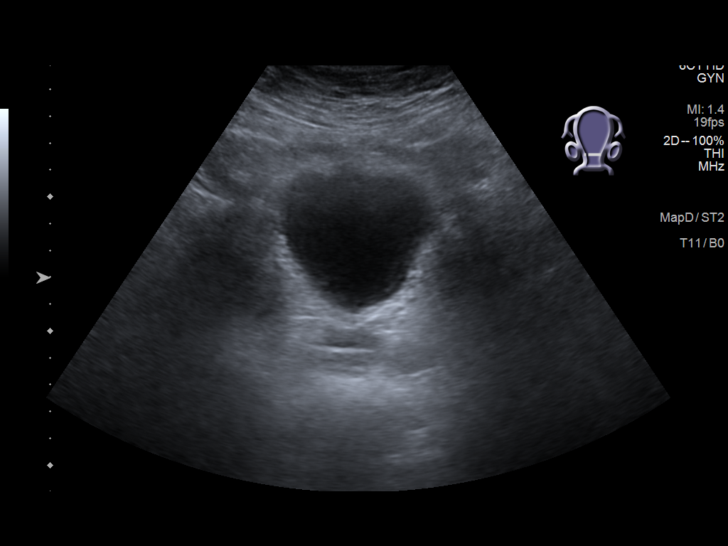
[im 23/91]
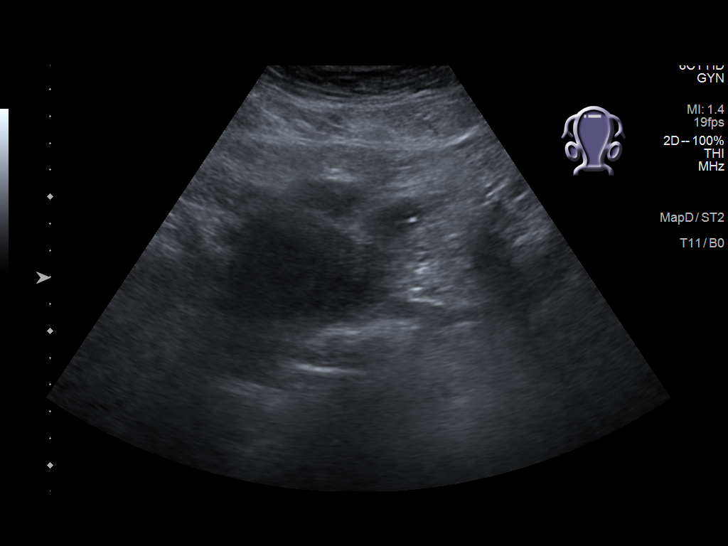
[im 31/91]
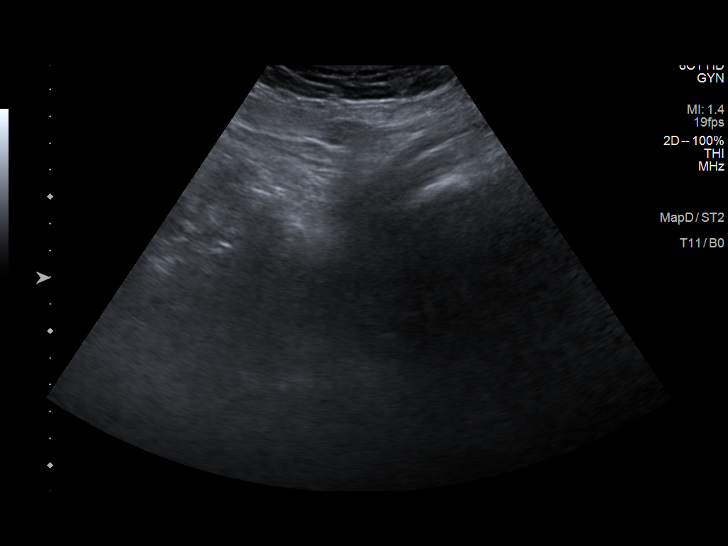
[im 34/91]
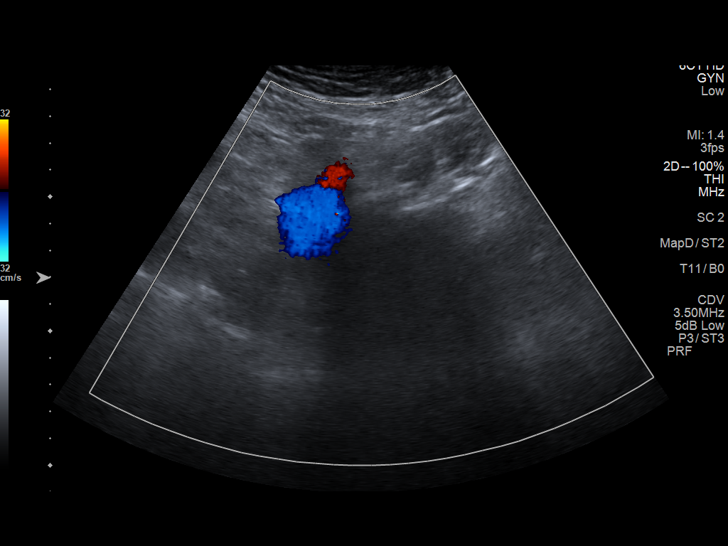
[im 42/91]
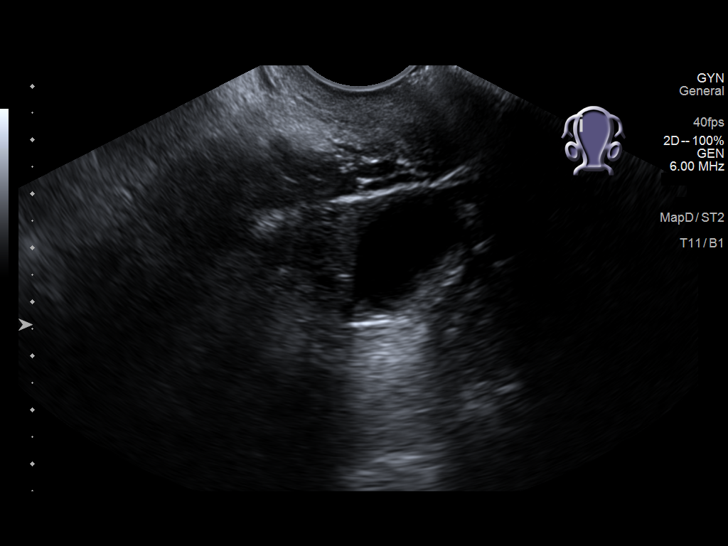
[im 49/91]
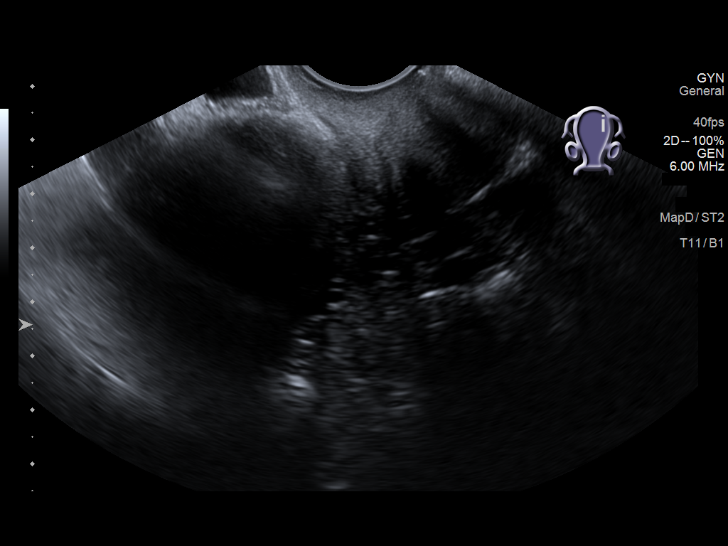
[im 57/91]
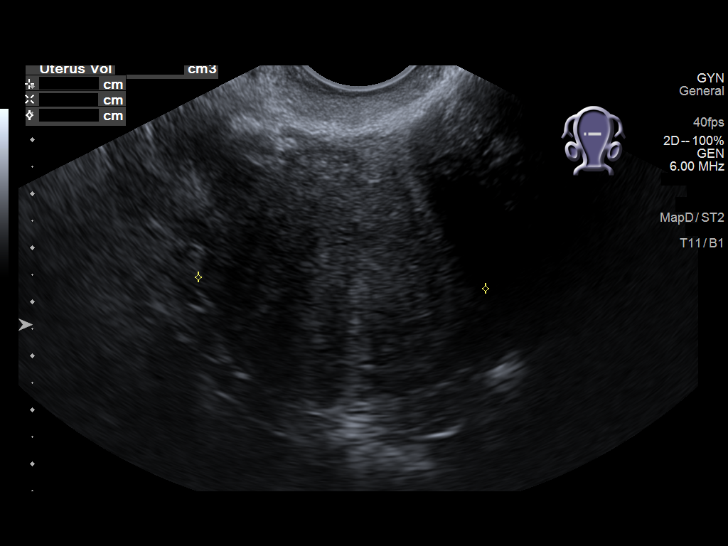
[im 61/91]
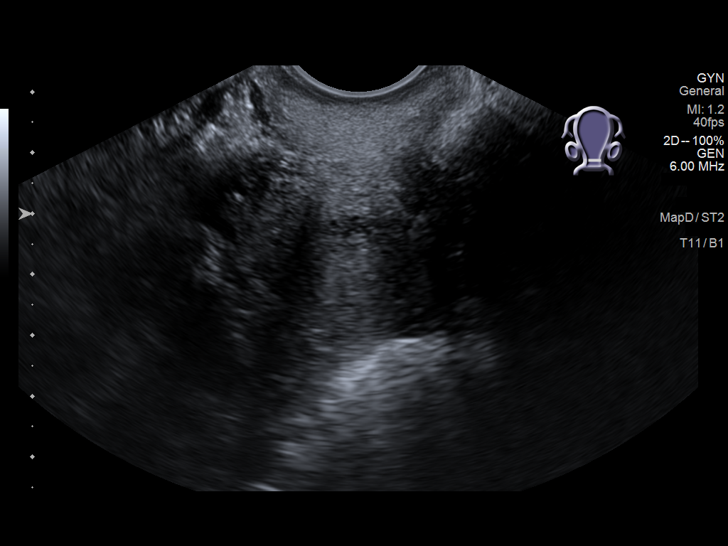
[im 68/91]
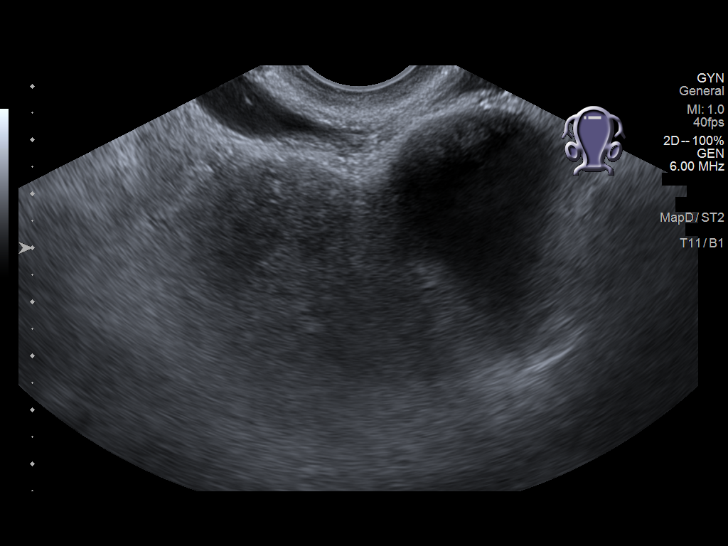
[im 76/91]
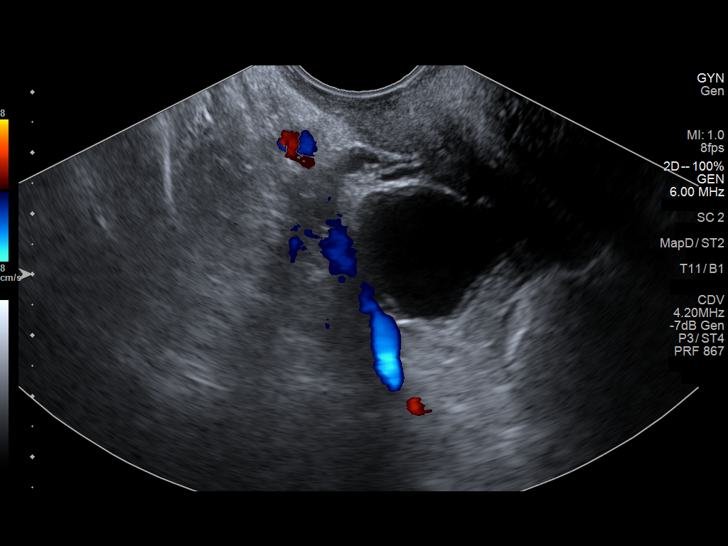
[im 83/91]
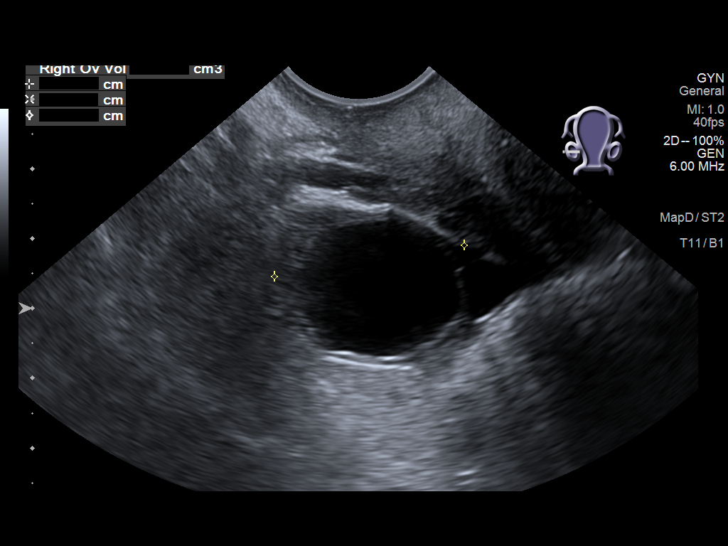
[im 91/91]
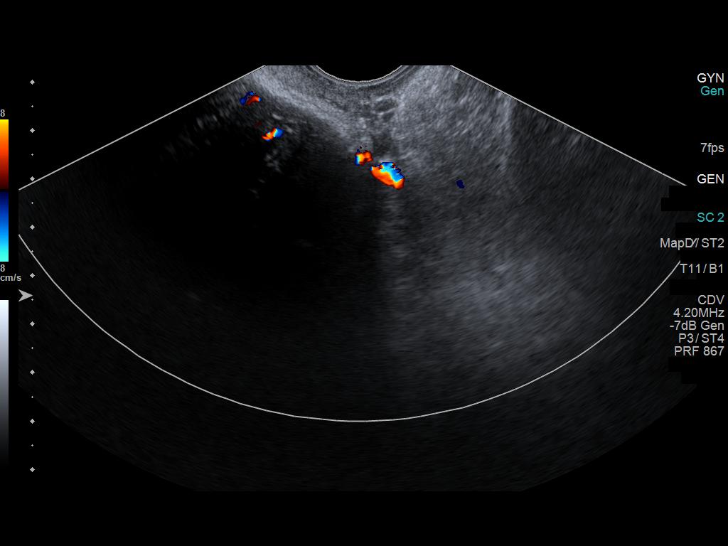

[14 of 25 positions shown; findings below may reference images not displayed]

FINDINGS: Uterus

Measurements: 8.1 x 4.7 x 5.3 cm = volume: 105 mL. Large hypoechoic
shadowing mass exophytic to the left fundus of the uterus measuring
4.2 x 3.8 x 4 cm.

Endometrium

Thickness: 7 mm.  No focal abnormality visualized.

Right ovary

Measurements: 3.6 x 2.3 x 2.8 cm = volume: 11.9 mL. Cyst with thin
septation measuring 2.9 x 1 2 x 2.7 cm.

Left ovary

Not seen

Other findings

No abnormal free fluid.
IMPRESSION: 1. Normal premenopausal endometrial thickness of 7 mm.
2. Large hypoechoic shadowing mass exophytic to the left uterine
fundus, felt consistent with a fibroid.
3. Nonvisualized left ovary

## 2021-05-20 LAB — CBC WITH DIFFERENTIAL/PLATELET
Basophils Absolute: 0.1 10*3/uL (ref 0.0–0.2)
Basos: 1 %
EOS (ABSOLUTE): 0.1 10*3/uL (ref 0.0–0.4)
Eos: 1 %
Hematocrit: 42.6 % (ref 34.0–46.6)
Hemoglobin: 14.2 g/dL (ref 11.1–15.9)
Immature Grans (Abs): 0 10*3/uL (ref 0.0–0.1)
Immature Granulocytes: 0 %
Lymphocytes Absolute: 3.3 10*3/uL — ABNORMAL HIGH (ref 0.7–3.1)
Lymphs: 30 %
MCH: 27.1 pg (ref 26.6–33.0)
MCHC: 33.3 g/dL (ref 31.5–35.7)
MCV: 81 fL (ref 79–97)
Monocytes Absolute: 0.8 10*3/uL (ref 0.1–0.9)
Monocytes: 8 %
Neutrophils Absolute: 6.8 10*3/uL (ref 1.4–7.0)
Neutrophils: 60 %
Platelets: 437 10*3/uL (ref 150–450)
RBC: 5.24 x10E6/uL (ref 3.77–5.28)
RDW: 12 % (ref 11.7–15.4)
WBC: 11 10*3/uL — ABNORMAL HIGH (ref 3.4–10.8)

## 2021-05-20 LAB — COMPREHENSIVE METABOLIC PANEL
ALT: 60 IU/L — ABNORMAL HIGH (ref 0–32)
AST: 48 IU/L — ABNORMAL HIGH (ref 0–40)
Albumin/Globulin Ratio: 2 (ref 1.2–2.2)
Albumin: 5.1 g/dL — ABNORMAL HIGH (ref 3.8–4.8)
Alkaline Phosphatase: 124 IU/L — ABNORMAL HIGH (ref 44–121)
BUN/Creatinine Ratio: 14 (ref 9–23)
BUN: 15 mg/dL (ref 6–24)
Bilirubin Total: 1.3 mg/dL — ABNORMAL HIGH (ref 0.0–1.2)
CO2: 25 mmol/L (ref 20–29)
Calcium: 10.7 mg/dL — ABNORMAL HIGH (ref 8.7–10.2)
Chloride: 99 mmol/L (ref 96–106)
Creatinine, Ser: 1.06 mg/dL — ABNORMAL HIGH (ref 0.57–1.00)
Globulin, Total: 2.6 g/dL (ref 1.5–4.5)
Glucose: 120 mg/dL — ABNORMAL HIGH (ref 70–99)
Potassium: 4.6 mmol/L (ref 3.5–5.2)
Sodium: 141 mmol/L (ref 134–144)
Total Protein: 7.7 g/dL (ref 6.0–8.5)
eGFR: 65 mL/min/{1.73_m2} (ref >59)

## 2021-05-20 LAB — LIPID PANEL WITH LDL/HDL RATIO
Cholesterol, Total: 230 mg/dL — ABNORMAL HIGH (ref 100–199)
HDL: 67 mg/dL (ref >39)
LDL Chol Calc (NIH): 130 mg/dL — ABNORMAL HIGH (ref 0–99)
LDL/HDL Ratio: 1.9 ratio (ref 0.0–3.2)
Triglycerides: 187 mg/dL — ABNORMAL HIGH (ref 0–149)
VLDL Cholesterol Cal: 33 mg/dL (ref 5–40)

## 2021-05-20 LAB — TSH+FREE T4
Free T4: 2.08 ng/dL — ABNORMAL HIGH (ref 0.82–1.77)
TSH: 0.008 u[IU]/mL — ABNORMAL LOW (ref 0.450–4.500)

## 2021-05-26 ENCOUNTER — Encounter: Payer: Self-pay | Admitting: Physician Assistant

## 2021-05-26 ENCOUNTER — Telehealth: Payer: Self-pay

## 2021-05-26 NOTE — Telephone Encounter (Signed)
Left vm and sent mychart message to confirm 05/30/21 appointment-Toni

## 2021-05-30 ENCOUNTER — Ambulatory Visit (INDEPENDENT_AMBULATORY_CARE_PROVIDER_SITE_OTHER): Payer: Self-pay | Admitting: Physician Assistant

## 2021-05-30 ENCOUNTER — Encounter: Payer: Self-pay | Admitting: Physician Assistant

## 2021-05-30 ENCOUNTER — Other Ambulatory Visit: Payer: Self-pay

## 2021-05-30 DIAGNOSIS — E039 Hypothyroidism, unspecified: Secondary | ICD-10-CM

## 2021-05-30 DIAGNOSIS — Z0001 Encounter for general adult medical examination with abnormal findings: Secondary | ICD-10-CM

## 2021-05-30 DIAGNOSIS — F409 Phobic anxiety disorder, unspecified: Secondary | ICD-10-CM

## 2021-05-30 DIAGNOSIS — R7989 Other specified abnormal findings of blood chemistry: Secondary | ICD-10-CM

## 2021-05-30 DIAGNOSIS — G2581 Restless legs syndrome: Secondary | ICD-10-CM

## 2021-05-30 DIAGNOSIS — F5105 Insomnia due to other mental disorder: Secondary | ICD-10-CM

## 2021-05-30 DIAGNOSIS — R7301 Impaired fasting glucose: Secondary | ICD-10-CM

## 2021-05-30 DIAGNOSIS — R300 Dysuria: Secondary | ICD-10-CM

## 2021-05-30 DIAGNOSIS — G8929 Other chronic pain: Secondary | ICD-10-CM

## 2021-05-30 DIAGNOSIS — M545 Low back pain, unspecified: Secondary | ICD-10-CM

## 2021-05-30 LAB — POCT GLYCOSYLATED HEMOGLOBIN (HGB A1C): Hemoglobin A1C: 6.2 % — AB (ref 4.0–5.6)

## 2021-05-30 MED ORDER — ZOLPIDEM TARTRATE ER 12.5 MG PO TBCR: 12.5000 mg | EXTENDED_RELEASE_TABLET | Freq: Every evening | ORAL | 2 refills | Status: DC | PRN

## 2021-05-30 MED ORDER — TIZANIDINE HCL 4 MG PO TABS: 2.0000 mg | ORAL_TABLET | Freq: Two times a day (BID) | ORAL | 2 refills | Status: DC | PRN

## 2021-05-30 MED ORDER — LEVOTHYROXINE SODIUM 175 MCG PO TABS: 175.0000 ug | ORAL_TABLET | Freq: Every day | ORAL | 3 refills | Status: DC

## 2021-05-30 MED ORDER — ROPINIROLE HCL 1 MG PO TABS: 1.0000 mg | ORAL_TABLET | Freq: Every day | ORAL | 2 refills | Status: DC

## 2021-05-30 NOTE — Progress Notes (Signed)
[facility] [place], [place] [address]  Internal MEDICINE  Office Visit Note  Patient Name: Stephanie Larson  [id number]  [id number]  Date of Service: 06/05/2021  Chief Complaint  Patient presents with   Annual Exam   Depression   Anxiety     HPI Pt is here for routine health maintenance examination -reviewed labs, showed elevated free T4 and will lower synthroid dose -calcium level elevated, does take supplement and will reduce this -was taking tylenol# 3 after dental work which could be a cause of elevated LFTs however this was not recent. Will order abdominal RUQ [place] given LFTs and bilirubin -cholesterol elevated but improved from last year and will continue lipitor -Glucose also elevated so A1c drawn and found to be prediabetic -does colonics, no Fhx of colon cancer, will need to consider cologuard vs colonoscopy -Needs med refills  Current Medication: Outpatient Encounter Medications as of 05/30/2021  Medication Sig Note   atorvastatin (LIPITOR) 10 MG tablet Take 1 tablet (10 mg total) by mouth daily.    chlorhexidine (PERIDEX) 0.12 % solution Use as directed 10 mLs in the mouth or throat 2 (two) times daily.    fexofenadine (ALLEGRA) 180 MG tablet Take 1 tablet (180 mg total) by mouth daily.    gabapentin (NEURONTIN) 300 MG capsule TAKE ONE CAPSULE BY MOUTH TWICE DAILY    hydrOXYzine (ATARAX/VISTARIL) 50 MG tablet Take 1 tablet (50 mg total) by mouth 3 (three) times daily as needed.    levothyroxine (SYNTHROID) 175 MCG tablet Take 1 tablet (175 mcg total) by mouth daily.    mometasone (ELOCON) 0.1 % cream APPLY TO AFFECTED AREAS FROM FACE TO LOWER EXTREMITIES TWICE A DAY TO SPOT TREAT DURING FLARES 06/23/2019: As needed for Eczema   montelukast (SINGULAIR) 10 MG tablet Take 1 tablet (10 mg total) by mouth at bedtime.    [DISCONTINUED] levothyroxine (SYNTHROID) 200 MCG tablet Take 1 tablet (200 mcg total) by mouth daily before breakfast.     [DISCONTINUED] rOPINIRole (REQUIP) 1 MG tablet Take 1 tablet (1 mg total) by mouth at bedtime.    [DISCONTINUED] tiZANidine (ZANAFLEX) 4 MG tablet Take 0.5-1 tablets (2-4 mg total) by mouth 2 (two) times daily as needed for muscle spasms.    [DISCONTINUED] zolpidem (AMBIEN CR) 12.5 MG CR tablet Take 1 tablet (12.5 mg total) by mouth at bedtime as needed for sleep.    rOPINIRole (REQUIP) 1 MG tablet Take 1 tablet (1 mg total) by mouth at bedtime.    tiZANidine (ZANAFLEX) 4 MG tablet Take 0.5-1 tablets (2-4 mg total) by mouth 2 (two) times daily as needed for muscle spasms.    zolpidem (AMBIEN CR) 12.5 MG CR tablet Take 1 tablet (12.5 mg total) by mouth at bedtime as needed for sleep.    No facility-administered encounter medications on file as of 05/30/2021.    Surgical History: Past Surgical History:  Procedure Laterality Date   CYSTOSCOPY N/A 05/13/2019   Procedure: CYSTOSCOPY;  Surgeon: Will Bonnet, MD;  Location: [facility];  Service: Gynecology;  Laterality: N/A;   LAPAROSCOPIC GASTRIC BANDING     TOTAL LAPAROSCOPIC HYSTERECTOMY WITH SALPINGECTOMY N/A 05/13/2019   Procedure: TOTAL LAPAROSCOPIC HYSTERECTOMY WITH BILATERAL SALPINGECTOMY;  Surgeon: Will Bonnet, MD;  Location: [facility];  Service: Gynecology;  Laterality: N/A;   TUBAL LIGATION      Medical History: Past Medical History:  Diagnosis Date   Anxiety    Depression    Family history of ovarian cancer  qualifies for cancer genetic testing   Hypothyroidism    Insomnia    Memory changes    Sinus drainage    Sleep apnea    Thyroid disease     Family History: Family History  Problem Relation Age of Onset   Colon cancer Paternal Grandfather    Ovarian cancer Paternal Grandmother 48   Hypertension Maternal Grandmother    Hypertension Father    Emphysema Father    ALS Mother    Hypertension Mother       Review of Systems  Constitutional:  Negative for chills, fatigue and unexpected weight change.   HENT:  Negative for congestion, rhinorrhea, sneezing and sore throat.   Eyes:  Negative for redness.  Respiratory:  Negative for cough, chest tightness and shortness of breath.   Cardiovascular:  Negative for chest pain and palpitations.  Gastrointestinal:  Negative for abdominal pain, constipation, diarrhea, nausea and vomiting.  Genitourinary:  Negative for dysuria and frequency.  Musculoskeletal:  Negative for arthralgias, back pain, joint swelling and neck pain.  Skin:  Negative for rash.  Neurological: Negative.  Negative for tremors and numbness.  Hematological:  Negative for adenopathy. Does not bruise/bleed easily.  Psychiatric/Behavioral:  Negative for behavioral problems (Depression), sleep disturbance and suicidal ideas. The patient is not nervous/anxious.     Vital Signs: BP 123/82    Pulse 79    Temp 98.2 F (36.8 C)    Resp 16    Ht _0  (1.676 m)    Wt 219 lb 9.6 oz (99.6 kg)    LMP 04/18/2019 (Exact Date)    SpO2 96%    BMI 35.44 kg/m    Physical Exam Vitals reviewed.  Constitutional:      General: She is not in acute distress.    Appearance: Normal appearance. She is obese. She is not ill-appearing.  HENT:     Head: Normocephalic and atraumatic.  Eyes:     Extraocular Movements: Extraocular movements intact.     Pupils: Pupils are equal, round, and reactive to light.  Cardiovascular:     Rate and Rhythm: Normal rate and regular rhythm.  Pulmonary:     Effort: Pulmonary effort is normal. No respiratory distress.  Abdominal:     General: Abdomen is flat.     Tenderness: There is no abdominal tenderness.  Musculoskeletal:        General: Normal range of motion.  Skin:    General: Skin is warm and dry.  Neurological:     Mental Status: She is alert and oriented to person, place, and time.     Cranial Nerves: No cranial nerve deficit.     Coordination: Coordination normal.     Gait: Gait normal.  Psychiatric:        Mood and Affect: Mood normal.         Behavior: Behavior normal.     LABS: Recent Results (from the past 2160 hour(s))  TSH + free T4     Status: Abnormal   Collection Time: 05/19/21  3:54 PM  Result Value Ref Range   TSH 0.008 (L) 0.450 - 4.500 uIU/mL   Free T4 2.08 (H) 0.82 - 1.77 ng/dL  Comprehensive metabolic panel     Status: Abnormal   Collection Time: 05/19/21  3:54 PM  Result Value Ref Range   Glucose 120 (H) 70 - 99 mg/dL   BUN 15 6 - 24 mg/dL   Creatinine, Ser 1.06 (H) 0.57 - 1.00 mg/dL   eGFR  65 >59 mL/min/1.73   BUN/Creatinine Ratio 14 9 - 23   Sodium 141 134 - 144 mmol/L   Potassium 4.6 3.5 - 5.2 mmol/L   Chloride 99 96 - 106 mmol/L   CO2 25 20 - 29 mmol/L   Calcium 10.7 (H) 8.7 - 10.2 mg/dL   Total Protein 7.7 6.0 - 8.5 g/dL   Albumin 5.1 (H) 3.8 - 4.8 g/dL   Globulin, Total 2.6 1.5 - 4.5 g/dL   Albumin/Globulin Ratio 2.0 1.2 - 2.2   Bilirubin Total 1.3 (H) 0.0 - 1.2 mg/dL   Alkaline Phosphatase 124 (H) 44 - 121 IU/L   AST 48 (H) 0 - 40 IU/L   ALT 60 (H) 0 - 32 IU/L  Lipid Panel With LDL/HDL Ratio     Status: Abnormal   Collection Time: 05/19/21  3:54 PM  Result Value Ref Range   Cholesterol, Total 230 (H) 100 - 199 mg/dL   Triglycerides 187 (H) 0 - 149 mg/dL   HDL 67 >39 mg/dL   VLDL Cholesterol Cal 33 5 - 40 mg/dL   LDL Chol Calc (NIH) 130 (H) 0 - 99 mg/dL   LDL/HDL Ratio 1.9 0.0 - 3.2 ratio    Comment:                                     LDL/HDL Ratio                                             Men  Women                               1/2 Avg.Risk  1.0    1.5                                   Avg.Risk  3.6    3.2                                2X Avg.Risk  6.2    5.0                                3X Avg.Risk  8.0    6.1   CBC w/Diff/Platelet     Status: Abnormal   Collection Time: 05/19/21  3:54 PM  Result Value Ref Range   WBC 11.0 (H) 3.4 - 10.8 x10E3/uL   RBC 5.24 3.77 - 5.28 x10E6/uL   Hemoglobin 14.2 11.1 - 15.9 g/dL   Hematocrit 42.6 34.0 - 46.6 %   MCV 81 79 - 97 fL   MCH  27.1 26.6 - 33.0 pg   MCHC 33.3 31.5 - 35.7 g/dL   RDW 12.0 11.7 - 15.4 %   Platelets 437 150 - 450 x10E3/uL   Neutrophils 60 Not Estab. %   Lymphs 30 Not Estab. %   Monocytes 8 Not Estab. %   Eos 1 Not Estab. %   Basos 1 Not Estab. %   Neutrophils Absolute 6.8 1.4 - 7.0 x10E3/uL   Lymphocytes Absolute 3.3 (H) 0.7 -  3.1 x10E3/uL   Monocytes Absolute 0.8 0.1 - 0.9 x10E3/uL   EOS (ABSOLUTE) 0.1 0.0 - 0.4 x10E3/uL   Basophils Absolute 0.1 0.0 - 0.2 x10E3/uL   Immature Granulocytes 0 Not Estab. %   Immature Grans (Abs) 0.0 0.0 - 0.1 x10E3/uL  UA/M w/rflx Culture, Routine     Status: Abnormal   Collection Time: 05/30/21  3:44 PM   Specimen: Urine   Urine  Result Value Ref Range   Specific Gravity, UA 1.028 1.005 - 1.030   pH, UA 5.5 5.0 - 7.5   Color, UA Yellow Yellow   Appearance Ur Turbid (A) Clear   Leukocytes,UA Negative Negative   Protein,UA Negative Negative/Trace   Glucose, UA Negative Negative   Ketones, UA Trace (A) Negative   RBC, UA Negative Negative   Bilirubin, UA Negative Negative   Urobilinogen, Ur 0.2 0.2 - 1.0 mg/dL   Nitrite, UA Negative Negative   Microscopic Examination Comment     Comment: Microscopic follows if indicated.   Microscopic Examination See below:     Comment: Microscopic was indicated and was performed.   Urinalysis Reflex Comment     Comment: This specimen will not reflex to a Urine Culture.  Microscopic Examination     Status: Abnormal   Collection Time: 05/30/21  3:44 PM   Urine  Result Value Ref Range   WBC, UA None seen 0 - 5 /hpf   RBC None seen 0 - 2 /hpf   Epithelial Cells (non renal) 0-10 0 - 10 /hpf   Casts None seen None seen /lpf   Crystals Present (A) N/A   Crystal Type Calcium Oxalate N/A   Bacteria, UA None seen None seen/Few  POCT HgB A1C     Status: Abnormal   Collection Time: 05/30/21  3:45 PM  Result Value Ref Range   Hemoglobin A1C 6.2 (A) 4.0 - 5.6 %   HbA1c POC (<> result, manual entry)     HbA1c, POC  (prediabetic range)     HbA1c, POC (controlled diabetic range)          Assessment/Plan: 1. Encounter for general adult medical examination with abnormal findings CPE performed, routine fasting labs reviewed, will need to consider colon cancer screening and mammogram  2. Elevated LFTs Checkup right upper quadrant abdominal ultrasound as well as update labs - US Abdomen Limited RUQ (LIVER/GB); Future - Comprehensive metabolic panel  3. Acquired hypothyroidism Will reduce dose to 175 mcg of Synthroid and recheck labs after 6 to 8 weeks - levothyroxine (SYNTHROID) 175 MCG tablet; Take 1 tablet (175 mcg total) by mouth daily.  Dispense: 90 tablet; Refill: 3 - TSH + free T4  4. Elevated fasting glucose - POCT HgB A1C is 6.2 which places her in prediabetic range.  Patient will work on diet and exercise we will continue to monitor  5. Serum calcium elevated Will reduce calcium supplementation, and will recheck labs  6. Restless leg syndrome - rOPINIRole (REQUIP) 1 MG tablet; Take 1 tablet (1 mg total) by mouth at bedtime.  Dispense: 30 tablet; Refill: 2  7. Chronic midline low back pain without sciatica - tiZANidine (ZANAFLEX) 4 MG tablet; Take 0.5-1 tablets (2-4 mg total) by mouth 2 (two) times daily as needed for muscle spasms.  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  Dispense: 30 tablet;  Refill: 2  9. Dysuria - UA/M w/rflx Culture, Routine   General Counseling: Merinda verbalizes understanding of the findings of todays visit and agrees with plan of treatment. I have discussed any further diagnostic evaluation that may be needed or ordered today. We also reviewed her medications today. she has been encouraged to call the office with any questions or concerns that should arise related to todays visit.    Counseling:    Orders Placed This  Encounter  Procedures   Microscopic Examination   US Abdomen Limited RUQ (LIVER/GB)   UA/M w/rflx Culture, Routine   TSH + free T4   Comprehensive metabolic panel   POCT HgB A1C    Meds ordered this encounter  Medications   rOPINIRole (REQUIP) 1 MG tablet    Sig: Take 1 tablet (1 mg total) by mouth at bedtime.    Dispense:  30 tablet    Refill:  2   tiZANidine (ZANAFLEX) 4 MG tablet    Sig: Take 0.5-1 tablets (2-4 mg total) by mouth 2 (two) times daily as needed for muscle spasms.    Dispense:  60 tablet    Refill:  2   zolpidem (AMBIEN CR) 12.5 MG CR tablet    Sig: Take 1 tablet (12.5 mg total) by mouth at bedtime as needed for sleep.    Dispense:  30 tablet    Refill:  2   levothyroxine (SYNTHROID) 175 MCG tablet    Sig: Take 1 tablet (175 mcg total) by mouth daily.    Dispense:  90 tablet    Refill:  3    This patient was seen by Drema Dallas, PA-C in collaboration with Dr. Clayborn Bigness as a part of collaborative care agreement.  Total time spent:40 Minutes  Time spent includes review of chart, medications, test results, and follow up plan with the patient.     Lavera Guise, MD  Internal Medicine

## 2021-05-31 LAB — UA/M W/RFLX CULTURE, ROUTINE
Bilirubin, UA: NEGATIVE
Glucose, UA: NEGATIVE
Leukocytes,UA: NEGATIVE
Nitrite, UA: NEGATIVE
Protein,UA: NEGATIVE
RBC, UA: NEGATIVE
Specific Gravity, UA: 1.028 (ref 1.005–1.030)
Urobilinogen, Ur: 0.2 mg/dL (ref 0.2–1.0)
pH, UA: 5.5 (ref 5.0–7.5)

## 2021-05-31 LAB — MICROSCOPIC EXAMINATION
Bacteria, UA: NONE SEEN
Casts: NONE SEEN /lpf
RBC, Urine: NONE SEEN /hpf (ref 0–2)
WBC, UA: NONE SEEN /hpf (ref 0–5)

## 2021-06-03 ENCOUNTER — Telehealth: Payer: Self-pay

## 2021-06-03 NOTE — Telephone Encounter (Signed)
Left vm to confirm 06/08/21 u/s appointment-Toni

## 2021-06-08 ENCOUNTER — Other Ambulatory Visit: Payer: Self-pay

## 2021-06-08 ENCOUNTER — Ambulatory Visit: Payer: Self-pay

## 2021-06-08 DIAGNOSIS — R7989 Other specified abnormal findings of blood chemistry: Secondary | ICD-10-CM

## 2021-06-16 ENCOUNTER — Other Ambulatory Visit: Payer: Self-pay

## 2021-06-16 ENCOUNTER — Ambulatory Visit (INDEPENDENT_AMBULATORY_CARE_PROVIDER_SITE_OTHER): Payer: Self-pay | Admitting: Physician Assistant

## 2021-06-16 DIAGNOSIS — R7989 Other specified abnormal findings of blood chemistry: Secondary | ICD-10-CM

## 2021-06-16 DIAGNOSIS — K760 Fatty (change of) liver, not elsewhere classified: Secondary | ICD-10-CM

## 2021-06-16 DIAGNOSIS — K8020 Calculus of gallbladder without cholecystitis without obstruction: Secondary | ICD-10-CM

## 2021-06-16 NOTE — Progress Notes (Signed)
[facility] [place], [place] [address]  Internal MEDICINE  Office Visit Note  Patient Name: Stephanie Larson  [id number]  [id number]  Date of Service: 06/26/2021  Chief Complaint  Patient presents with   Follow-up    Review U/S results   Depression    HPI Pt is here for abdominal US follow up -This was done due to abnormal LFTs -She does have evidence of fatty liver disease as well as cholelithiasis but is non-obstructing -Denies any nausea, diarrhea, or Abdominal pain therefore will repeat labs in 4-6 weeks for monitoring--already ordered last visit.  Kidneys normal on ultrasound -Discussed if worsening may need to see GI/general surgery for further evaluation especially if any symptoms arise  Current Medication: Outpatient Encounter Medications as of 06/16/2021  Medication Sig Note   atorvastatin (LIPITOR) 10 MG tablet Take 1 tablet (10 mg total) by mouth daily.    chlorhexidine (PERIDEX) 0.12 % solution Use as directed 10 mLs in the mouth or throat 2 (two) times daily.    fexofenadine (ALLEGRA) 180 MG tablet Take 1 tablet (180 mg total) by mouth daily.    gabapentin (NEURONTIN) 300 MG capsule TAKE ONE CAPSULE BY MOUTH TWICE DAILY    hydrOXYzine (ATARAX/VISTARIL) 50 MG tablet Take 1 tablet (50 mg total) by mouth 3 (three) times daily as needed.    levothyroxine (SYNTHROID) 175 MCG tablet Take 1 tablet (175 mcg total) by mouth daily.    mometasone (ELOCON) 0.1 % cream APPLY TO AFFECTED AREAS FROM FACE TO LOWER EXTREMITIES TWICE A DAY TO SPOT TREAT DURING FLARES 06/23/2019: As needed for Eczema   montelukast (SINGULAIR) 10 MG tablet Take 1 tablet (10 mg total) by mouth at bedtime.    rOPINIRole (REQUIP) 1 MG tablet Take 1 tablet (1 mg total) by mouth at bedtime.    tiZANidine (ZANAFLEX) 4 MG tablet Take 0.5-1 tablets (2-4 mg total) by mouth 2 (two) times daily as needed for muscle spasms.    zolpidem (AMBIEN CR) 12.5 MG CR tablet Take 1 tablet (12.5 mg total) by  mouth at bedtime as needed for sleep.    No facility-administered encounter medications on file as of 06/16/2021.    Surgical History: Past Surgical History:  Procedure Laterality Date   CYSTOSCOPY N/A 05/13/2019   Procedure: CYSTOSCOPY;  Surgeon: Will Bonnet, MD;  Location: [facility];  Service: Gynecology;  Laterality: N/A;   LAPAROSCOPIC GASTRIC BANDING     TOTAL LAPAROSCOPIC HYSTERECTOMY WITH SALPINGECTOMY N/A 05/13/2019   Procedure: TOTAL LAPAROSCOPIC HYSTERECTOMY WITH BILATERAL SALPINGECTOMY;  Surgeon: Will Bonnet, MD;  Location: [facility];  Service: Gynecology;  Laterality: N/A;   TUBAL LIGATION      Medical History: Past Medical History:  Diagnosis Date   Anxiety    Depression    Family history of ovarian cancer    qualifies for cancer genetic testing   Hypothyroidism    Insomnia    Memory changes    Sinus drainage    Sleep apnea    Thyroid disease     Family History: Family History  Problem Relation Age of Onset   Colon cancer Paternal Grandfather    Ovarian cancer Paternal Grandmother 82   Hypertension Maternal Grandmother    Hypertension Father    Emphysema Father    ALS Mother    Hypertension Mother     Social History   Socioeconomic History   Marital status: Married    Spouse name: Not on file   Number of  children: Not on file   Years of education: Not on file   Highest education level: Not on file  Occupational History   Not on file  Tobacco Use   Smoking status: Former    Packs/day: 0.50    Types: Cigarettes   Smokeless tobacco: Never   Tobacco comments:    Pt quit 1 month ago 10/22  Vaping Use   Vaping Use: Never used  Substance and Sexual Activity   Alcohol use: Not Currently    Comment: occasionally   Drug use: Never   Sexual activity: Yes    Birth control/protection: None  Other Topics Concern   Not on file  Social History Narrative   Not on file   Social Determinants of Health   Financial Resource Strain: Not on  file  Food Insecurity: Not on file  Transportation Needs: Not on file  Physical Activity: Not on file  Stress: Not on file  Social Connections: Not on file  Intimate Partner Violence: Not on file      Review of Systems  Constitutional:  Negative for chills, fatigue and unexpected weight change.  HENT:  Negative for congestion, rhinorrhea, sneezing and sore throat.   Eyes:  Negative for redness.  Respiratory:  Negative for cough, chest tightness and shortness of breath.   Cardiovascular:  Negative for chest pain and palpitations.  Gastrointestinal:  Negative for abdominal pain, constipation, diarrhea, nausea and vomiting.  Genitourinary:  Negative for dysuria and frequency.  Musculoskeletal:  Negative for arthralgias, back pain, joint swelling and neck pain.  Skin:  Negative for rash.  Neurological: Negative.  Negative for tremors and numbness.  Hematological:  Negative for adenopathy. Does not bruise/bleed easily.  Psychiatric/Behavioral:  Negative for behavioral problems (Depression), sleep disturbance and suicidal ideas. The patient is not nervous/anxious.    Vital Signs: BP 121/77    Pulse 82    Temp 98.3 F (36.8 C)    Resp 16    Ht 5\' 6"  (1.676 m)    Wt 219 lb 6.4 oz (99.5 kg)    LMP 04/18/2019 (Exact Date)    SpO2 95%    BMI 35.41 kg/m    Physical Exam Vitals and nursing note reviewed.  Constitutional:      General: She is not in acute distress.    Appearance: Normal appearance. She is obese. She is not ill-appearing.  HENT:     Head: Normocephalic and atraumatic.  Eyes:     Extraocular Movements: Extraocular movements intact.     Pupils: Pupils are equal, round, and reactive to light.  Cardiovascular:     Rate and Rhythm: Normal rate and regular rhythm.  Pulmonary:     Effort: Pulmonary effort is normal. No respiratory distress.  Abdominal:     General: Abdomen is flat.     Tenderness: There is no abdominal tenderness.  Musculoskeletal:        General: Normal  range of motion.  Skin:    General: Skin is warm and dry.  Neurological:     Mental Status: She is alert and oriented to person, place, and time.     Cranial Nerves: No cranial nerve deficit.     Coordination: Coordination normal.     Gait: Gait normal.  Psychiatric:        Mood and Affect: Mood normal.        Behavior: Behavior normal.       Assessment/Plan: 1. Elevated LFTs We will repeat labs in 4 to 6 weeks  2. Fatty liver Abdominal ultrasound consistent with fatty liver disease however only mildly elevated LFTs and patient is nonsymptomatic therefore we will continue to monitor for now.  If any worsening or symptoms develop may need GI referral  3. Calculus of gallbladder without cholecystitis without obstruction Incidentally found on abdominal ultrasound and or not causing any symptoms, will continue to monitor.  May need HIDA scan or general surgery referral if any changes   General Counseling: Kessler verbalizes understanding of the findings of todays visit and agrees with plan of treatment. I have discussed any further diagnostic evaluation that may be needed or ordered today. We also reviewed her medications today. she has been encouraged to call the office with any questions or concerns that should arise related to todays visit.    No orders of the defined types were placed in this encounter.   No orders of the defined types were placed in this encounter.   This patient was seen by Drema Dallas, PA-C in collaboration with Dr. Clayborn Bigness as a part of collaborative care agreement.   Total time spent:30 Minutes Time spent includes review of chart, medications, test results, and follow up plan with the patient.      Dr Lavera Guise Internal medicine

## 2021-06-29 ENCOUNTER — Other Ambulatory Visit: Payer: Self-pay

## 2021-06-29 ENCOUNTER — Telehealth: Payer: Self-pay

## 2021-06-29 ENCOUNTER — Other Ambulatory Visit: Payer: Self-pay | Admitting: Physician Assistant

## 2021-06-29 DIAGNOSIS — Z760 Encounter for issue of repeat prescription: Secondary | ICD-10-CM

## 2021-06-29 DIAGNOSIS — F5105 Insomnia due to other mental disorder: Secondary | ICD-10-CM

## 2021-06-29 DIAGNOSIS — M545 Low back pain, unspecified: Secondary | ICD-10-CM

## 2021-06-29 DIAGNOSIS — F409 Phobic anxiety disorder, unspecified: Secondary | ICD-10-CM

## 2021-06-29 DIAGNOSIS — F411 Generalized anxiety disorder: Secondary | ICD-10-CM

## 2021-06-29 DIAGNOSIS — G8929 Other chronic pain: Secondary | ICD-10-CM

## 2021-06-29 DIAGNOSIS — G2581 Restless legs syndrome: Secondary | ICD-10-CM

## 2021-06-29 MED ORDER — GABAPENTIN 300 MG PO CAPS: 300.0000 mg | ORAL_CAPSULE | Freq: Two times a day (BID) | ORAL | 3 refills | Status: DC

## 2021-06-29 MED ORDER — TIZANIDINE HCL 4 MG PO TABS: 2.0000 mg | ORAL_TABLET | Freq: Two times a day (BID) | ORAL | 2 refills | Status: DC | PRN

## 2021-06-29 MED ORDER — ROPINIROLE HCL 1 MG PO TABS: 1.0000 mg | ORAL_TABLET | Freq: Every day | ORAL | 2 refills | Status: DC

## 2021-06-29 MED ORDER — ZOLPIDEM TARTRATE ER 12.5 MG PO TBCR: 12.5000 mg | EXTENDED_RELEASE_TABLET | Freq: Every evening | ORAL | 0 refills | Status: DC | PRN

## 2021-06-29 MED ORDER — HYDROXYZINE HCL 50 MG PO TABS: 50.0000 mg | ORAL_TABLET | Freq: Three times a day (TID) | ORAL | 2 refills | Status: DC | PRN

## 2021-06-29 NOTE — Telephone Encounter (Signed)
Send all med to phar  ?

## 2021-07-26 ENCOUNTER — Other Ambulatory Visit: Payer: Self-pay | Admitting: Physician Assistant

## 2021-07-26 DIAGNOSIS — F409 Phobic anxiety disorder, unspecified: Secondary | ICD-10-CM

## 2021-07-27 LAB — COMPREHENSIVE METABOLIC PANEL
ALT: 32 IU/L (ref 0–32)
AST: 30 IU/L (ref 0–40)
Albumin/Globulin Ratio: 1.9 (ref 1.2–2.2)
Albumin: 4.6 g/dL (ref 3.8–4.8)
Alkaline Phosphatase: 108 IU/L (ref 44–121)
BUN/Creatinine Ratio: 9 (ref 9–23)
BUN: 9 mg/dL (ref 6–24)
Bilirubin Total: 0.8 mg/dL (ref 0.0–1.2)
CO2: 22 mmol/L (ref 20–29)
Calcium: 9.4 mg/dL (ref 8.7–10.2)
Chloride: 103 mmol/L (ref 96–106)
Creatinine, Ser: 0.99 mg/dL (ref 0.57–1.00)
Globulin, Total: 2.4 g/dL (ref 1.5–4.5)
Glucose: 114 mg/dL — ABNORMAL HIGH (ref 70–99)
Potassium: 3.8 mmol/L (ref 3.5–5.2)
Sodium: 140 mmol/L (ref 134–144)
Total Protein: 7 g/dL (ref 6.0–8.5)
eGFR: 70 mL/min/{1.73_m2} (ref >59)

## 2021-07-27 LAB — TSH+FREE T4
Free T4: 1.74 ng/dL (ref 0.82–1.77)
TSH: 0.022 u[IU]/mL — ABNORMAL LOW (ref 0.450–4.500)

## 2021-07-28 ENCOUNTER — Ambulatory Visit: Payer: Self-pay | Admitting: Physician Assistant

## 2021-08-01 ENCOUNTER — Other Ambulatory Visit: Payer: Self-pay | Admitting: Physician Assistant

## 2021-08-01 ENCOUNTER — Other Ambulatory Visit: Payer: Self-pay

## 2021-08-01 DIAGNOSIS — F409 Phobic anxiety disorder, unspecified: Secondary | ICD-10-CM

## 2021-08-01 DIAGNOSIS — E039 Hypothyroidism, unspecified: Secondary | ICD-10-CM

## 2021-08-01 DIAGNOSIS — E782 Mixed hyperlipidemia: Secondary | ICD-10-CM

## 2021-08-01 MED ORDER — LEVOTHYROXINE SODIUM 175 MCG PO TABS: 175.0000 ug | ORAL_TABLET | Freq: Every day | ORAL | 3 refills | Status: DC

## 2021-08-01 NOTE — Telephone Encounter (Signed)
Next appt 08/08/21 ?

## 2021-08-01 NOTE — Telephone Encounter (Signed)
Next 4/10 ?

## 2021-08-01 NOTE — Telephone Encounter (Signed)
Med sent to pharmacy, must come to appointment on 4/10 for additional refills ?

## 2021-08-08 ENCOUNTER — Encounter: Payer: Self-pay | Admitting: Physician Assistant

## 2021-08-08 ENCOUNTER — Ambulatory Visit (INDEPENDENT_AMBULATORY_CARE_PROVIDER_SITE_OTHER): Payer: Self-pay | Admitting: Physician Assistant

## 2021-08-08 VITALS — BP 113/70 | HR 82 | Temp 97.7°F | Resp 16 | Ht 66.0 in | Wt 221.6 lb

## 2021-08-08 DIAGNOSIS — E039 Hypothyroidism, unspecified: Secondary | ICD-10-CM

## 2021-08-08 DIAGNOSIS — J301 Allergic rhinitis due to pollen: Secondary | ICD-10-CM

## 2021-08-08 DIAGNOSIS — F409 Phobic anxiety disorder, unspecified: Secondary | ICD-10-CM

## 2021-08-08 DIAGNOSIS — E041 Nontoxic single thyroid nodule: Secondary | ICD-10-CM

## 2021-08-08 DIAGNOSIS — R7303 Prediabetes: Secondary | ICD-10-CM

## 2021-08-08 DIAGNOSIS — F5105 Insomnia due to other mental disorder: Secondary | ICD-10-CM

## 2021-08-08 MED ORDER — MONTELUKAST SODIUM 10 MG PO TABS: 10.0000 mg | ORAL_TABLET | Freq: Every day | ORAL | 1 refills | Status: DC

## 2021-08-08 MED ORDER — ZOLPIDEM TARTRATE ER 12.5 MG PO TBCR: 12.5000 mg | EXTENDED_RELEASE_TABLET | Freq: Every evening | ORAL | 2 refills | Status: DC | PRN

## 2021-08-08 MED ORDER — FEXOFENADINE HCL 180 MG PO TABS: 180.0000 mg | ORAL_TABLET | Freq: Every day | ORAL | 1 refills | Status: AC

## 2021-08-08 NOTE — Progress Notes (Signed)
[facility] ?[address] ?[place], [place] [address] ? ?Internal MEDICINE  ?Office Visit Note ? ?Patient Name: Stephanie Larson ? [id number]  ?[id number] ? ?Date of Service: 08/08/2021 ? ?Chief Complaint  ?Patient presents with  ? Follow-up  ?  Review labwork  ? Depression  ? Anxiety  ? Allergies  ?  Having seasonal allergies - needs something for irritated eyes  ? ? ?HPI ?Pt is here for routine follow up and lab review ?-She is due to recheck thyroid US for nodule previously found. Had been referred to endocrinology previously due to high dose of synthroid use, but pt did not like provider and did not continue follow up. Her thyroid labs were recently redone after lowering synthroid dose and free T4 is now in range but still at upper limit. Will need to monitor closely and consider lowering again ?-metabolic panel greatly improved, sugar still elevated and will need to monitor A1c again next visit as she was in prediabetic range last check ?-Has itchy eyes and tried pataday eye drops per pharmacy recommendation, will try alaway or other antihistamine eye drop OTC--advised to double check manufacturer ? ?Current Medication: ?Outpatient Encounter Medications as of 08/08/2021  ?Medication Sig Note  ? atorvastatin (LIPITOR) 10 MG tablet TAKE 1 TABLET BY MOUTH DAILY   ? chlorhexidine (PERIDEX) 0.12 % solution Use as directed 10 mLs in the mouth or throat 2 (two) times daily.   ? gabapentin (NEURONTIN) 300 MG capsule Take 1 capsule (300 mg total) by mouth 2 (two) times daily.   ? hydrOXYzine (ATARAX) 50 MG tablet Take 1 tablet (50 mg total) by mouth 3 (three) times daily as needed.   ? levothyroxine (SYNTHROID) 175 MCG tablet Take 1 tablet (175 mcg total) by mouth daily.   ? mometasone (ELOCON) 0.1 % cream APPLY TO AFFECTED AREAS FROM FACE TO LOWER EXTREMITIES TWICE A DAY TO SPOT TREAT DURING FLARES 06/23/2019: As needed for Eczema  ? rOPINIRole (REQUIP) 1 MG tablet Take 1 tablet (1 mg total) by mouth at bedtime.   ?  tiZANidine (ZANAFLEX) 4 MG tablet Take 0.5-1 tablets (2-4 mg total) by mouth 2 (two) times daily as needed for muscle spasms.   ? [DISCONTINUED] fexofenadine (ALLEGRA) 180 MG tablet Take 1 tablet (180 mg total) by mouth daily.   ? [DISCONTINUED] montelukast (SINGULAIR) 10 MG tablet Take 1 tablet (10 mg total) by mouth at bedtime.   ? [DISCONTINUED] zolpidem (AMBIEN CR) 12.5 MG CR tablet TAKE 1 TABLET BY MOUTH AT BEDTIME AS NEEDED FOR SLEEP   ? fexofenadine (ALLEGRA) 180 MG tablet Take 1 tablet (180 mg total) by mouth daily.   ? montelukast (SINGULAIR) 10 MG tablet Take 1 tablet (10 mg total) by mouth at bedtime.   ? [START ON 08/31/2021] zolpidem (AMBIEN CR) 12.5 MG CR tablet Take 1 tablet (12.5 mg total) by mouth at bedtime as needed. for sleep   ? ?No facility-administered encounter medications on file as of 08/08/2021.  ? ? ?Surgical History: ?Past Surgical History:  ?Procedure Laterality Date  ? CYSTOSCOPY N/A 05/13/2019  ? Procedure: CYSTOSCOPY;  Surgeon: Will Bonnet, MD;  Location: [facility];  Service: Gynecology;  Laterality: N/A;  ? LAPAROSCOPIC GASTRIC BANDING    ? TOTAL LAPAROSCOPIC HYSTERECTOMY WITH SALPINGECTOMY N/A 05/13/2019  ? Procedure: TOTAL LAPAROSCOPIC HYSTERECTOMY WITH BILATERAL SALPINGECTOMY;  Surgeon: Will Bonnet, MD;  Location: [facility];  Service: Gynecology;  Laterality: N/A;  ? TUBAL LIGATION    ? ? ?Medical History: ?Past  Medical History:  ?Diagnosis Date  ? Anxiety   ? Depression   ? Family history of ovarian cancer   ? qualifies for cancer genetic testing  ? Hypothyroidism   ? Insomnia   ? Memory changes   ? Sinus drainage   ? Sleep apnea   ? Thyroid disease   ? ? ?Family History: ?Family History  ?Problem Relation Age of Onset  ? Colon cancer Paternal Grandfather   ? Ovarian cancer Paternal Grandmother 51  ? Hypertension Maternal Grandmother   ? Hypertension Father   ? Emphysema Father   ? ALS Mother   ? Hypertension Mother   ? ? ?Social History  ? ?Socioeconomic History  ?  Marital status: Married  ?  Spouse name: Not on file  ? Number of children: Not on file  ? Years of education: Not on file  ? Highest education level: Not on file  ?Occupational History  ? Not on file  ?Tobacco Use  ? Smoking status: Former  ?  Packs/day: 0.50  ?  Types: Cigarettes  ? Smokeless tobacco: Never  ? Tobacco comments:  ?  Pt quit 1 month ago 10/22  ?Vaping Use  ? Vaping Use: Never used  ?Substance and Sexual Activity  ? Alcohol use: Not Currently  ?  Comment: occasionally  ? Drug use: Never  ? Sexual activity: Yes  ?  Birth control/protection: None  ?Other Topics Concern  ? Not on file  ?Social History Narrative  ? Not on file  ? ?Social Determinants of Health  ? ?Financial Resource Strain: Not on file  ?Food Insecurity: Not on file  ?Transportation Needs: Not on file  ?Physical Activity: Not on file  ?Stress: Not on file  ?Social Connections: Not on file  ?Intimate Partner Violence: Not on file  ? ? ? ? ?Review of Systems  ?Constitutional:  Negative for chills, fatigue and unexpected weight change.  ?HENT:  Negative for congestion, rhinorrhea, sneezing and sore throat.   ?Eyes:  Positive for itching. Negative for redness.  ?Respiratory:  Negative for cough, chest tightness and shortness of breath.   ?Cardiovascular:  Negative for chest pain and palpitations.  ?Gastrointestinal:  Negative for abdominal pain, constipation, diarrhea, nausea and vomiting.  ?Genitourinary:  Negative for dysuria and frequency.  ?Musculoskeletal:  Negative for arthralgias, back pain, joint swelling and neck pain.  ?Skin:  Negative for rash.  ?Allergic/Immunologic: Positive for environmental allergies.  ?Neurological: Negative.  Negative for tremors and numbness.  ?Hematological:  Negative for adenopathy. Does not bruise/bleed easily.  ?Psychiatric/Behavioral:  Negative for behavioral problems (Depression), sleep disturbance and suicidal ideas. The patient is not nervous/anxious.   ? ?Vital Signs: ?BP 113/70   Pulse 82   Temp  97.7 ?F (36.5 ?C)   Resp 16   Ht '5\' 6"'$  (1.676 m)   Wt 221 lb 9.6 oz (100.5 kg)   LMP 04/18/2019 (Exact Date)   SpO2 97%   BMI 35.77 kg/m?  ? ? ?Physical Exam ?Vitals and nursing note reviewed.  ?Constitutional:   ?   General: She is not in acute distress. ?   Appearance: Normal appearance. She is obese. She is not ill-appearing.  ?HENT:  ?   Head: Normocephalic and atraumatic.  ?Eyes:  ?   Extraocular Movements: Extraocular movements intact.  ?   Pupils: Pupils are equal, round, and reactive to light.  ?Cardiovascular:  ?   Rate and Rhythm: Normal rate and regular rhythm.  ?Pulmonary:  ?   Effort: Pulmonary  effort is normal. No respiratory distress.  ?Abdominal:  ?   General: Abdomen is flat.  ?   Tenderness: There is no abdominal tenderness.  ?Musculoskeletal:     ?   General: Normal range of motion.  ?Skin: ?   General: Skin is warm and dry.  ?Neurological:  ?   Mental Status: She is alert and oriented to person, place, and time.  ?   Cranial Nerves: No cranial nerve deficit.  ?   Coordination: Coordination normal.  ?   Gait: Gait normal.  ?Psychiatric:     ?   Mood and Affect: Mood normal.     ?   Behavior: Behavior normal.  ? ? ? ? ? ?Assessment/Plan: ?1. Acquired hypothyroidism ?Will order thyroid US for follow up of nodules and high dose synthroid use ?- US THYROID; Future ? ?2. Right thyroid nodule ?Will order thyroid US for follow up of nodules and high dose synthroid use ?- US THYROID; Future ? ?3. Seasonal allergic rhinitis due to pollen ?Continue medications and may try antihistamine eye drop as needed ?- montelukast (SINGULAIR) 10 MG tablet; Take 1 tablet (10 mg total) by mouth at bedtime.  Dispense: 90 tablet; Refill: 1 ?- fexofenadine (ALLEGRA) 180 MG tablet; Take 1 tablet (180 mg total) by mouth daily.  Dispense: 90 tablet; Refill: 1 ? 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 for sleep  Dispense:  30 tablet; Refill: 2 ? ?5. Prediabetes ?Will need to recheck A1c next visit, continue to work on diet and exercise ? ? ?General Counseling: Danial verbalizes understanding of the findings of todays visit and agrees

## 2021-09-22 ENCOUNTER — Other Ambulatory Visit: Payer: Self-pay | Admitting: Physician Assistant

## 2021-09-22 DIAGNOSIS — F411 Generalized anxiety disorder: Secondary | ICD-10-CM

## 2021-09-22 DIAGNOSIS — G8929 Other chronic pain: Secondary | ICD-10-CM

## 2021-09-22 DIAGNOSIS — G2581 Restless legs syndrome: Secondary | ICD-10-CM

## 2021-09-27 ENCOUNTER — Telehealth: Payer: Self-pay

## 2021-09-27 NOTE — Telephone Encounter (Signed)
Left vm and sent mychart message to confirm 10/03/21 appointment-Toni

## 2021-10-03 ENCOUNTER — Ambulatory Visit: Payer: Self-pay

## 2021-10-03 DIAGNOSIS — E039 Hypothyroidism, unspecified: Secondary | ICD-10-CM

## 2021-10-03 DIAGNOSIS — E041 Nontoxic single thyroid nodule: Secondary | ICD-10-CM

## 2021-10-17 ENCOUNTER — Other Ambulatory Visit: Payer: Self-pay

## 2021-10-25 ENCOUNTER — Other Ambulatory Visit: Payer: Self-pay | Admitting: Physician Assistant

## 2021-10-25 DIAGNOSIS — Z760 Encounter for issue of repeat prescription: Secondary | ICD-10-CM

## 2021-11-07 ENCOUNTER — Ambulatory Visit: Payer: Self-pay | Admitting: Physician Assistant

## 2021-11-17 ENCOUNTER — Encounter: Payer: Self-pay | Admitting: Physician Assistant

## 2021-11-17 ENCOUNTER — Ambulatory Visit: Payer: Self-pay | Admitting: Physician Assistant

## 2021-11-17 DIAGNOSIS — F5105 Insomnia due to other mental disorder: Secondary | ICD-10-CM

## 2021-11-17 DIAGNOSIS — E039 Hypothyroidism, unspecified: Secondary | ICD-10-CM

## 2021-11-17 DIAGNOSIS — F411 Generalized anxiety disorder: Secondary | ICD-10-CM

## 2021-11-17 DIAGNOSIS — R21 Rash and other nonspecific skin eruption: Secondary | ICD-10-CM

## 2021-11-17 DIAGNOSIS — M545 Low back pain, unspecified: Secondary | ICD-10-CM

## 2021-11-17 DIAGNOSIS — E782 Mixed hyperlipidemia: Secondary | ICD-10-CM

## 2021-11-17 DIAGNOSIS — F409 Phobic anxiety disorder, unspecified: Secondary | ICD-10-CM

## 2021-11-17 DIAGNOSIS — G2581 Restless legs syndrome: Secondary | ICD-10-CM

## 2021-11-17 DIAGNOSIS — G8929 Other chronic pain: Secondary | ICD-10-CM

## 2021-11-17 MED ORDER — ZOLPIDEM TARTRATE ER 12.5 MG PO TBCR: 12.5000 mg | EXTENDED_RELEASE_TABLET | Freq: Every evening | ORAL | 2 refills | Status: DC | PRN

## 2021-11-17 MED ORDER — MOMETASONE FUROATE 0.1 % EX CREA
TOPICAL_CREAM | CUTANEOUS | 0 refills | Status: AC
Start: 2021-11-17 — End: ?

## 2021-11-17 MED ORDER — HYDROXYZINE HCL 50 MG PO TABS: 50.0000 mg | ORAL_TABLET | Freq: Three times a day (TID) | ORAL | 2 refills | Status: DC | PRN

## 2021-11-17 MED ORDER — ROPINIROLE HCL 1 MG PO TABS: 1.0000 mg | ORAL_TABLET | Freq: Every day | ORAL | 2 refills | Status: DC

## 2021-11-17 MED ORDER — ATORVASTATIN CALCIUM 10 MG PO TABS: 10.0000 mg | ORAL_TABLET | Freq: Every day | ORAL | 3 refills | Status: DC

## 2021-11-17 MED ORDER — TIZANIDINE HCL 4 MG PO TABS: ORAL_TABLET | ORAL | 2 refills | Status: DC

## 2021-11-17 NOTE — Progress Notes (Signed)
[facility] [place], [place] [address]  Internal MEDICINE  Office Visit Note  Patient Name: Stephanie Larson  [id number]  [id number]  Date of Service: 11/23/2021  Chief Complaint  Patient presents with   Follow-up   Depression   Rash    Rash on forehead   Quality Metric Gaps    Colonoscopy    HPI Pt is here for routine follow up to go over thyroid US -rash on forehead for the past month. Not itchy or painful. Has tried using cream at home. She does have hx of eczema and thinks this may be what is causing it, but cant get in with dermatology until Oct--she is on the cancellation list to get in sooner. Does request refill of her elocon cream in meantime -Thyroid US showed significant reduction in size of thyroid, but no nodules seen. Previous study had shown calcified nodule, but was not seen on repeat study -Needs med refills -Pt declines colonoscopy, reports she does colonics   Current Medication: Outpatient Encounter Medications as of 11/17/2021  Medication Sig Note   chlorhexidine (PERIDEX) 0.12 % solution Use as directed 10 mLs in the mouth or throat 2 (two) times daily.    fexofenadine (ALLEGRA) 180 MG tablet Take 1 tablet (180 mg total) by mouth daily.    gabapentin (NEURONTIN) 300 MG capsule TAKE ONE CAPSULE BY MOUTH TWICE A DAY    levothyroxine (SYNTHROID) 175 MCG tablet Take 1 tablet (175 mcg total) by mouth daily.    montelukast (SINGULAIR) 10 MG tablet Take 1 tablet (10 mg total) by mouth at bedtime.    [DISCONTINUED] atorvastatin (LIPITOR) 10 MG tablet TAKE 1 TABLET BY MOUTH DAILY    [DISCONTINUED] hydrOXYzine (ATARAX) 50 MG tablet TAKE 1 TABLET BY MOUTH 3 TIMES DAILY AS NEEDED    [DISCONTINUED] mometasone (ELOCON) 0.1 % cream APPLY TO AFFECTED AREAS FROM FACE TO LOWER EXTREMITIES TWICE A DAY TO SPOT TREAT DURING FLARES 06/23/2019: As needed for Eczema   [DISCONTINUED] rOPINIRole (REQUIP) 1 MG tablet TAKE 1 TABLET BY MOUTH AT BEDTIME     [DISCONTINUED] tiZANidine (ZANAFLEX) 4 MG tablet TAKE 1/2 TO 1 TABLET BY MOUTH 2 TIMES DAILY AS NEEDED FOR MUSCLE SPASMS    [DISCONTINUED] zolpidem (AMBIEN CR) 12.5 MG CR tablet Take 1 tablet (12.5 mg total) by mouth at bedtime as needed. for sleep    atorvastatin (LIPITOR) 10 MG tablet Take 1 tablet (10 mg total) by mouth daily.    hydrOXYzine (ATARAX) 50 MG tablet Take 1 tablet (50 mg total) by mouth 3 (three) times daily as needed.    mometasone (ELOCON) 0.1 % cream APPLY TO AFFECTED AREAS FROM FACE TO LOWER EXTREMITIES TWICE A DAY TO SPOT TREAT DURING FLARES    rOPINIRole (REQUIP) 1 MG tablet Take 1 tablet (1 mg total) by mouth at bedtime.    tiZANidine (ZANAFLEX) 4 MG tablet TAKE 1/2 TO 1 TABLET BY MOUTH 2 TIMES DAILY AS NEEDED FOR MUSCLE SPASMS    [START ON 11/30/2021] zolpidem (AMBIEN CR) 12.5 MG CR tablet Take 1 tablet (12.5 mg total) by mouth at bedtime as needed. for sleep    No facility-administered encounter medications on file as of 11/17/2021.    Surgical History: Past Surgical History:  Procedure Laterality Date   CYSTOSCOPY N/A 05/13/2019   Procedure: CYSTOSCOPY;  Surgeon: Will Bonnet, MD;  Location: [facility];  Service: Gynecology;  Laterality: N/A;   LAPAROSCOPIC GASTRIC BANDING     TOTAL LAPAROSCOPIC HYSTERECTOMY WITH  SALPINGECTOMY N/A 05/13/2019   Procedure: TOTAL LAPAROSCOPIC HYSTERECTOMY WITH BILATERAL SALPINGECTOMY;  Surgeon: Will Bonnet, MD;  Location: [facility];  Service: Gynecology;  Laterality: N/A;   TUBAL LIGATION      Medical History: Past Medical History:  Diagnosis Date   Anxiety    Depression    Family history of ovarian cancer    qualifies for cancer genetic testing   Hypothyroidism    Insomnia    Memory changes    Sinus drainage    Sleep apnea    Thyroid disease     Family History: Family History  Problem Relation Age of Onset   Colon cancer Paternal Grandfather    Ovarian cancer Paternal Grandmother 53   Hypertension Maternal  Grandmother    Hypertension Father    Emphysema Father    ALS Mother    Hypertension Mother     Social History   Socioeconomic History   Marital status: Married    Spouse name: Not on file   Number of children: Not on file   Years of education: Not on file   Highest education level: Not on file  Occupational History   Not on file  Tobacco Use   Smoking status: Former    Packs/day: 0.50    Types: Cigarettes   Smokeless tobacco: Never   Tobacco comments:    Pt quit 1 month ago 10/22  Vaping Use   Vaping Use: Never used  Substance and Sexual Activity   Alcohol use: Not Currently    Comment: occasionally   Drug use: Never   Sexual activity: Yes    Birth control/protection: None  Other Topics Concern   Not on file  Social History Narrative   Not on file   Social Determinants of Health   Financial Resource Strain: Not on file  Food Insecurity: Not on file  Transportation Needs: Not on file  Physical Activity: Not on file  Stress: Not on file  Social Connections: Not on file  Intimate Partner Violence: Not on file      Review of Systems  Constitutional:  Negative for chills, fatigue and unexpected weight change.  HENT:  Negative for congestion, rhinorrhea, sneezing and sore throat.   Eyes:  Negative for redness.  Respiratory:  Negative for cough, chest tightness and shortness of breath.   Cardiovascular:  Negative for chest pain and palpitations.  Gastrointestinal:  Negative for abdominal pain, constipation, diarrhea, nausea and vomiting.  Genitourinary:  Negative for dysuria and frequency.  Musculoskeletal:  Negative for arthralgias, back pain, joint swelling and neck pain.  Skin:  Positive for rash.  Neurological: Negative.  Negative for tremors and numbness.  Hematological:  Negative for adenopathy. Does not bruise/bleed easily.  Psychiatric/Behavioral:  Negative for behavioral problems (Depression), sleep disturbance and suicidal ideas. The patient is not  nervous/anxious.     Vital Signs: BP 133/78   Pulse 72   Temp 98.3 F (36.8 C)   Resp 16   Ht '5\' 6"'$  (1.676 m)   Wt 223 lb 9.6 oz (101.4 kg)   LMP 04/18/2019 (Exact Date)   SpO2 97%   BMI 36.09 kg/m    Physical Exam Vitals and nursing note reviewed.  Constitutional:      General: She is not in acute distress.    Appearance: Normal appearance. She is obese. She is not ill-appearing.  HENT:     Head: Normocephalic and atraumatic.  Eyes:     Extraocular Movements: Extraocular movements intact.  Pupils: Pupils are equal, round, and reactive to light.  Cardiovascular:     Rate and Rhythm: Normal rate and regular rhythm.  Pulmonary:     Effort: Pulmonary effort is normal. No respiratory distress.  Abdominal:     General: Abdomen is flat.     Tenderness: There is no abdominal tenderness.  Musculoskeletal:        General: Normal range of motion.  Skin:    General: Skin is warm and dry.     Findings: Rash present.     Comments: Rash present on forehead  Neurological:     Mental Status: She is alert and oriented to person, place, and time.     Cranial Nerves: No cranial nerve deficit.     Coordination: Coordination normal.     Gait: Gait normal.  Psychiatric:        Mood and Affect: Mood normal.        Behavior: Behavior normal.        Assessment/Plan: 1. Acquired hypothyroidism Continue synthroid as before, thyroid US showed reduced size but no nodules seen  2. Rash of face May use elocon cream as needed and will follow up with dermatology  3. Mixed hyperlipidemia - atorvastatin (LIPITOR) 10 MG tablet; Take 1 tablet (10 mg total) by mouth daily.  Dispense: 90 tablet; Refill: 3  4. GAD (generalized anxiety disorder) - hydrOXYzine (ATARAX) 50 MG tablet; Take 1 tablet (50 mg total) by mouth 3 (three) times daily as needed.  Dispense: 90 tablet; Refill: 2  5. Restless leg syndrome - rOPINIRole (REQUIP) 1 MG tablet; Take 1 tablet (1 mg total) by mouth at  bedtime.  Dispense: 30 tablet; Refill: 2  6. 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 for sleep  Dispense: 30 tablet; Refill: 2   General Counseling: Linet verbalizes understanding of the findings of todays visit and agrees with plan of treatment. I have discussed any further diagnostic evaluation that may be needed or ordered today. We also reviewed her medications today. she has been encouraged to call the office with any questions or concerns that should arise related to todays visit.    No orders of the defined types were placed in this encounter.   Meds ordered this encounter  Medications   atorvastatin (LIPITOR) 10 MG tablet    Sig: Take 1 tablet (10 mg total) by mouth daily.    Dispense:  90 tablet    Refill:  3   hydrOXYzine (ATARAX) 50 MG tablet    Sig: Take 1 tablet (50 mg total) by mouth 3 (three) times daily as needed.    Dispense:  90 tablet    Refill:  2   rOPINIRole (REQUIP) 1 MG tablet    Sig: Take 1 tablet (1 mg total) by mouth at bedtime.    Dispense:  30 tablet    Refill:  2   tiZANidine (ZANAFLEX) 4 MG tablet    Sig: TAKE 1/2 TO 1 TABLET BY MOUTH 2 TIMES DAILY AS NEEDED FOR MUSCLE SPASMS    Dispense:  60 tablet    Refill:  2   zolpidem (AMBIEN CR) 12.5 MG CR tablet    Sig:  Take 1 tablet (12.5 mg total) by mouth at bedtime as needed. for sleep    Dispense:  30 tablet    Refill:  2   mometasone (ELOCON) 0.1 % cream    Sig: APPLY TO AFFECTED AREAS FROM FACE TO LOWER EXTREMITIES TWICE A DAY TO SPOT TREAT DURING FLARES    Dispense:  45 g    Refill:  0    This patient was seen by Drema Dallas, PA-C in collaboration with Dr. Clayborn Bigness as a part of collaborative care  agreement.   Total time spent:30 Minutes Time spent includes review of chart, medications, test results, and follow up plan with the patient.      Dr Lavera Guise Internal medicine

## 2022-02-16 ENCOUNTER — Ambulatory Visit: Payer: Self-pay | Admitting: Physician Assistant

## 2022-02-16 ENCOUNTER — Encounter: Payer: Self-pay | Admitting: Physician Assistant

## 2022-02-16 DIAGNOSIS — G2581 Restless legs syndrome: Secondary | ICD-10-CM

## 2022-02-16 DIAGNOSIS — F5105 Insomnia due to other mental disorder: Secondary | ICD-10-CM

## 2022-02-16 DIAGNOSIS — E039 Hypothyroidism, unspecified: Secondary | ICD-10-CM

## 2022-02-16 DIAGNOSIS — G8929 Other chronic pain: Secondary | ICD-10-CM

## 2022-02-16 DIAGNOSIS — J301 Allergic rhinitis due to pollen: Secondary | ICD-10-CM

## 2022-02-16 DIAGNOSIS — F409 Phobic anxiety disorder, unspecified: Secondary | ICD-10-CM

## 2022-02-16 DIAGNOSIS — F411 Generalized anxiety disorder: Secondary | ICD-10-CM

## 2022-02-16 DIAGNOSIS — Z760 Encounter for issue of repeat prescription: Secondary | ICD-10-CM

## 2022-02-16 DIAGNOSIS — M545 Low back pain, unspecified: Secondary | ICD-10-CM

## 2022-02-16 MED ORDER — ZOLPIDEM TARTRATE ER 12.5 MG PO TBCR: 12.5000 mg | EXTENDED_RELEASE_TABLET | Freq: Every evening | ORAL | 2 refills | Status: DC | PRN

## 2022-02-16 MED ORDER — HYDROXYZINE HCL 50 MG PO TABS
50.0000 mg | ORAL_TABLET | Freq: Three times a day (TID) | ORAL | 2 refills | Status: DC | PRN
Start: 2022-02-16 — End: 2022-06-26

## 2022-02-16 MED ORDER — GABAPENTIN 300 MG PO CAPS: 300.0000 mg | ORAL_CAPSULE | Freq: Two times a day (BID) | ORAL | 3 refills | Status: DC

## 2022-02-16 MED ORDER — MONTELUKAST SODIUM 10 MG PO TABS: 10.0000 mg | ORAL_TABLET | Freq: Every day | ORAL | 1 refills | Status: DC

## 2022-02-16 MED ORDER — TIZANIDINE HCL 4 MG PO TABS
ORAL_TABLET | ORAL | 2 refills | Status: DC
Start: 2022-02-16 — End: 2022-05-15

## 2022-02-16 MED ORDER — ROPINIROLE HCL 1 MG PO TABS: 1.0000 mg | ORAL_TABLET | Freq: Every day | ORAL | 2 refills | Status: DC

## 2022-02-16 NOTE — Progress Notes (Signed)
[facility] [place],  [address]  Internal MEDICINE  Office Visit Note  Patient Name: Stephanie Larson  [id number]  [id number]  Date of Service: 02/16/2022  Chief Complaint  Patient presents with   Follow-up   Depression   Anxiety   Quality Metric Gaps    Colonoscopy    HPI Pt is here for routine follow up for med refills -She is sleeping well. Continues to require ambien to sleep at night. Has tried many alternatives before including other forms of ambien that did not work well for her. -RLS controlled on requip and gabapentin -Anxiety also well controlled -Needs refills today  Current Medication: Outpatient Encounter Medications as of 02/16/2022  Medication Sig   atorvastatin (LIPITOR) 10 MG tablet Take 1 tablet (10 mg total) by mouth daily.   chlorhexidine (PERIDEX) 0.12 % solution Use as directed 10 mLs in the mouth or throat 2 (two) times daily.   fexofenadine (ALLEGRA) 180 MG tablet Take 1 tablet (180 mg total) by mouth daily.   levothyroxine (SYNTHROID) 175 MCG tablet Take 1 tablet (175 mcg total) by mouth daily.   mometasone (ELOCON) 0.1 % cream APPLY TO AFFECTED AREAS FROM FACE TO LOWER EXTREMITIES TWICE A DAY TO SPOT TREAT DURING FLARES   [DISCONTINUED] gabapentin (NEURONTIN) 300 MG capsule TAKE ONE CAPSULE BY MOUTH TWICE A DAY   [DISCONTINUED] hydrOXYzine (ATARAX) 50 MG tablet Take 1 tablet (50 mg total) by mouth 3 (three) times daily as needed.   [DISCONTINUED] montelukast (SINGULAIR) 10 MG tablet Take 1 tablet (10 mg total) by mouth at bedtime.   [DISCONTINUED] rOPINIRole (REQUIP) 1 MG tablet Take 1 tablet (1 mg total) by mouth at bedtime.   [DISCONTINUED] tiZANidine (ZANAFLEX) 4 MG tablet TAKE 1/2 TO 1 TABLET BY MOUTH 2 TIMES DAILY AS NEEDED FOR MUSCLE SPASMS   [DISCONTINUED] zolpidem (AMBIEN CR) 12.5 MG CR tablet Take 1 tablet (12.5 mg total) by mouth at bedtime as needed. for sleep   gabapentin (NEURONTIN) 300 MG capsule Take 1  capsule (300 mg total) by mouth 2 (two) times daily.   hydrOXYzine (ATARAX) 50 MG tablet Take 1 tablet (50 mg total) by mouth 3 (three) times daily as needed.   montelukast (SINGULAIR) 10 MG tablet Take 1 tablet (10 mg total) by mouth at bedtime.   rOPINIRole (REQUIP) 1 MG tablet Take 1 tablet (1 mg total) by mouth at bedtime.   tiZANidine (ZANAFLEX) 4 MG tablet TAKE 1/2 TO 1 TABLET BY MOUTH 2 TIMES DAILY AS NEEDED FOR MUSCLE SPASMS   [START ON 02/28/2022] zolpidem (AMBIEN CR) 12.5 MG CR tablet Take 1 tablet (12.5 mg total) by mouth at bedtime as needed. for sleep   No facility-administered encounter medications on file as of 02/16/2022.    Surgical History: Past Surgical History:  Procedure Laterality Date   CYSTOSCOPY N/A 05/13/2019   Procedure: CYSTOSCOPY;  Surgeon: Will Bonnet, MD;  Location: [facility];  Service: Gynecology;  Laterality: N/A;   LAPAROSCOPIC GASTRIC BANDING     TOTAL LAPAROSCOPIC HYSTERECTOMY WITH SALPINGECTOMY N/A 05/13/2019   Procedure: TOTAL LAPAROSCOPIC HYSTERECTOMY WITH BILATERAL SALPINGECTOMY;  Surgeon: Will Bonnet, MD;  Location: [facility];  Service: Gynecology;  Laterality: N/A;   TUBAL LIGATION      Medical History: Past Medical History:  Diagnosis Date   Anxiety    Depression    Family history of ovarian cancer    qualifies for cancer genetic testing   Hypothyroidism    Insomnia  Memory changes    Sinus drainage    Sleep apnea    Thyroid disease     Family History: Family History  Problem Relation Age of Onset   Colon cancer Paternal Grandfather    Ovarian cancer Paternal Grandmother 4   Hypertension Maternal Grandmother    Hypertension Father    Emphysema Father    ALS Mother    Hypertension Mother     Social History   Socioeconomic History   Marital status: Married    Spouse name: Not on file   Number of children: Not on file   Years of education: Not on file   Highest education level: Not on file  Occupational  History   Not on file  Tobacco Use   Smoking status: Former    Packs/day: 0.50    Types: Cigarettes   Smokeless tobacco: Never   Tobacco comments:    Pt quit 1 month ago 10/22  Vaping Use   Vaping Use: Never used  Substance and Sexual Activity   Alcohol use: Not Currently    Comment: occasionally   Drug use: Never   Sexual activity: Yes    Birth control/protection: None  Other Topics Concern   Not on file  Social History Narrative   Not on file   Social Determinants of Health   Financial Resource Strain: Not on file  Food Insecurity: Not on file  Transportation Needs: Not on file  Physical Activity: Not on file  Stress: Not on file  Social Connections: Not on file  Intimate Partner Violence: Not on file      Review of Systems  Constitutional:  Negative for chills, fatigue and unexpected weight change.  HENT:  Negative for congestion, rhinorrhea, sneezing and sore throat.   Eyes:  Negative for redness.  Respiratory:  Negative for cough, chest tightness and shortness of breath.   Cardiovascular:  Negative for chest pain and palpitations.  Gastrointestinal:  Negative for abdominal pain, constipation, diarrhea, nausea and vomiting.  Genitourinary:  Negative for dysuria and frequency.  Musculoskeletal:  Negative for arthralgias, back pain, joint swelling and neck pain.  Skin:  Negative for rash.  Neurological: Negative.  Negative for tremors and numbness.  Hematological:  Negative for adenopathy. Does not bruise/bleed easily.  Psychiatric/Behavioral:  Negative for behavioral problems (Depression), sleep disturbance and suicidal ideas. The patient is not nervous/anxious.     Vital Signs: BP 124/87   Pulse 79   Temp 98.2 F (36.8 C)   Resp 16   Ht '5\' 6"'$  (1.676 m)   Wt 220 lb 12.8 oz (100.2 kg)   LMP 04/18/2019 (Exact Date)   SpO2 96%   BMI 35.64 kg/m    Physical Exam Vitals and nursing note reviewed.  Constitutional:      General: She is not in acute  distress.    Appearance: Normal appearance. She is obese. She is not ill-appearing.  HENT:     Head: Normocephalic and atraumatic.  Eyes:     Extraocular Movements: Extraocular movements intact.     Pupils: Pupils are equal, round, and reactive to light.  Cardiovascular:     Rate and Rhythm: Normal rate and regular rhythm.  Pulmonary:     Effort: Pulmonary effort is normal. No respiratory distress.  Abdominal:     General: Abdomen is flat.     Tenderness: There is no abdominal tenderness.  Musculoskeletal:        General: Normal range of motion.  Skin:    General:  Skin is warm and dry.  Neurological:     Mental Status: She is alert and oriented to person, place, and time.     Cranial Nerves: No cranial nerve deficit.     Coordination: Coordination normal.     Gait: Gait normal.  Psychiatric:        Mood and Affect: Mood normal.        Behavior: Behavior normal.        Assessment/Plan: 1. GAD (generalized anxiety disorder) , Continue current medications - hydrOXYzine (ATARAX) 50 MG tablet; Take 1 tablet (50 mg total) by mouth 3 (three) times daily as needed.  Dispense: 90 tablet; Refill: 2  2. Acquired hypothyroidism Continue Synthroid as before  3. Seasonal allergic rhinitis due to pollen - montelukast (SINGULAIR) 10 MG tablet; Take 1 tablet (10 mg total) by mouth at bedtime.  Dispense: 90 tablet; Refill: 1  4. Restless leg syndrome - rOPINIRole (REQUIP) 1 MG tablet; Take 1 tablet (1 mg total) by mouth at bedtime.  Dispense: 30 tablet; Refill: 2  5. 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 for sleep  Dispense: 30 tablet; Refill: 2  7. Encounter for medication refill - gabapentin  (NEURONTIN) 300 MG capsule; Take 1 capsule (300 mg total) by mouth 2 (two) times daily.  Dispense: 60 capsule; Refill: 3   General Counseling: Runette verbalizes understanding of the findings of todays visit and agrees with plan of treatment. I have discussed any further diagnostic evaluation that may be needed or ordered today. We also reviewed her medications today. she has been encouraged to call the office with any questions or concerns that should arise related to todays visit.    No orders of the defined types were placed in this encounter.   Meds ordered this encounter  Medications   gabapentin (NEURONTIN) 300 MG capsule    Sig: Take 1 capsule (300 mg total) by mouth 2 (two) times daily.    Dispense:  60 capsule    Refill:  3   hydrOXYzine (ATARAX) 50 MG tablet    Sig: Take 1 tablet (50 mg total) by mouth 3 (three) times daily as needed.    Dispense:  90 tablet    Refill:  2   montelukast (SINGULAIR) 10 MG tablet    Sig: Take 1 tablet (10 mg total) by mouth at bedtime.    Dispense:  90 tablet    Refill:  1   rOPINIRole (REQUIP) 1 MG tablet    Sig: Take 1 tablet (1 mg total) by mouth at bedtime.    Dispense:  30 tablet    Refill:  2   tiZANidine (ZANAFLEX) 4 MG tablet    Sig: TAKE 1/2 TO 1 TABLET BY MOUTH 2 TIMES DAILY AS NEEDED FOR MUSCLE SPASMS    Dispense:  60 tablet    Refill:  2   zolpidem (AMBIEN CR) 12.5 MG CR tablet    Sig: Take 1 tablet (12.5 mg total) by mouth at bedtime as needed. for sleep    Dispense:  30 tablet    Refill:  2    This patient was seen by Drema Dallas, PA-C in collaboration with Dr. Latricia Heft  Humphrey Rolls as a part of collaborative care agreement.   Total time spent:30 Minutes Time spent includes review of chart, medications, test results, and follow up plan with the patient.      Dr Lavera Guise Internal medicine

## 2022-05-15 ENCOUNTER — Encounter: Payer: Self-pay | Admitting: Physician Assistant

## 2022-05-15 ENCOUNTER — Ambulatory Visit: Payer: Self-pay | Admitting: Physician Assistant

## 2022-05-15 VITALS — BP 90/64 | HR 84 | Temp 98.4°F | Resp 16 | Ht 66.0 in | Wt 218.0 lb

## 2022-05-15 DIAGNOSIS — R5383 Other fatigue: Secondary | ICD-10-CM

## 2022-05-15 DIAGNOSIS — M545 Low back pain, unspecified: Secondary | ICD-10-CM

## 2022-05-15 DIAGNOSIS — E782 Mixed hyperlipidemia: Secondary | ICD-10-CM

## 2022-05-15 DIAGNOSIS — F5105 Insomnia due to other mental disorder: Secondary | ICD-10-CM

## 2022-05-15 DIAGNOSIS — G2581 Restless legs syndrome: Secondary | ICD-10-CM

## 2022-05-15 DIAGNOSIS — F409 Phobic anxiety disorder, unspecified: Secondary | ICD-10-CM

## 2022-05-15 DIAGNOSIS — R7303 Prediabetes: Secondary | ICD-10-CM

## 2022-05-15 DIAGNOSIS — E039 Hypothyroidism, unspecified: Secondary | ICD-10-CM

## 2022-05-15 DIAGNOSIS — G8929 Other chronic pain: Secondary | ICD-10-CM

## 2022-05-15 MED ORDER — ROPINIROLE HCL 1 MG PO TABS: 1.0000 mg | ORAL_TABLET | Freq: Every day | ORAL | 2 refills | Status: DC

## 2022-05-15 MED ORDER — TIZANIDINE HCL 4 MG PO TABS: ORAL_TABLET | ORAL | 2 refills | Status: DC

## 2022-05-15 MED ORDER — ZOLPIDEM TARTRATE ER 12.5 MG PO TBCR: 12.5000 mg | EXTENDED_RELEASE_TABLET | Freq: Every evening | ORAL | 2 refills | Status: DC | PRN

## 2022-05-15 MED ORDER — GABAPENTIN 300 MG PO CAPS: 300.0000 mg | ORAL_CAPSULE | Freq: Two times a day (BID) | ORAL | 3 refills | Status: DC

## 2022-05-15 NOTE — Progress Notes (Signed)
[facility] [place], [place] [address]  Internal MEDICINE  Office Visit Note  Patient Name: Stephanie Larson  [id number]  [id number]  Date of Service: 05/20/2022  Chief Complaint  Patient presents with   Follow-up   Medication Refill    HPI Pt is here for routine follow up -Pt is doing well today and has no concerns -Sleeping well with ambien and RLS well controlled on gabapentin and requip -Will order labs prior to CPE -BP low but patient states she just woke up and has not eaten or had water yet. Denies dizziness  Current Medication: Outpatient Encounter Medications as of 05/15/2022  Medication Sig   atorvastatin (LIPITOR) 10 MG tablet Take 1 tablet (10 mg total) by mouth daily.   chlorhexidine (PERIDEX) 0.12 % solution Use as directed 10 mLs in the mouth or throat 2 (two) times daily.   fexofenadine (ALLEGRA) 180 MG tablet Take 1 tablet (180 mg total) by mouth daily.   hydrOXYzine (ATARAX) 50 MG tablet Take 1 tablet (50 mg total) by mouth 3 (three) times daily as needed.   levothyroxine (SYNTHROID) 175 MCG tablet Take 1 tablet (175 mcg total) by mouth daily.   mometasone (ELOCON) 0.1 % cream APPLY TO AFFECTED AREAS FROM FACE TO LOWER EXTREMITIES TWICE A DAY TO SPOT TREAT DURING FLARES   montelukast (SINGULAIR) 10 MG tablet Take 1 tablet (10 mg total) by mouth at bedtime.   [DISCONTINUED] gabapentin (NEURONTIN) 300 MG capsule Take 1 capsule (300 mg total) by mouth 2 (two) times daily.   [DISCONTINUED] rOPINIRole (REQUIP) 1 MG tablet Take 1 tablet (1 mg total) by mouth at bedtime.   [DISCONTINUED] tiZANidine (ZANAFLEX) 4 MG tablet TAKE 1/2 TO 1 TABLET BY MOUTH 2 TIMES DAILY AS NEEDED FOR MUSCLE SPASMS   [DISCONTINUED] zolpidem (AMBIEN CR) 12.5 MG CR tablet Take 1 tablet (12.5 mg total) by mouth at bedtime as needed. for sleep   gabapentin (NEURONTIN) 300 MG capsule Take 1 capsule (300 mg total) by mouth 2 (two) times daily.   rOPINIRole (REQUIP) 1 MG tablet  Take 1 tablet (1 mg total) by mouth at bedtime.   tiZANidine (ZANAFLEX) 4 MG tablet TAKE 1/2 TO 1 TABLET BY MOUTH 2 TIMES DAILY AS NEEDED FOR MUSCLE SPASMS   [START ON 06/01/2022] zolpidem (AMBIEN CR) 12.5 MG CR tablet Take 1 tablet (12.5 mg total) by mouth at bedtime as needed. for sleep   No facility-administered encounter medications on file as of 05/15/2022.    Surgical History: Past Surgical History:  Procedure Laterality Date   CYSTOSCOPY N/A 05/13/2019   Procedure: CYSTOSCOPY;  Surgeon: Will Bonnet, MD;  Location: [facility];  Service: Gynecology;  Laterality: N/A;   LAPAROSCOPIC GASTRIC BANDING     TOTAL LAPAROSCOPIC HYSTERECTOMY WITH SALPINGECTOMY N/A 05/13/2019   Procedure: TOTAL LAPAROSCOPIC HYSTERECTOMY WITH BILATERAL SALPINGECTOMY;  Surgeon: Will Bonnet, MD;  Location: [facility];  Service: Gynecology;  Laterality: N/A;   TUBAL LIGATION      Medical History: Past Medical History:  Diagnosis Date   Anxiety    Depression    Family history of ovarian cancer    qualifies for cancer genetic testing   Hypothyroidism    Insomnia    Memory changes    Sinus drainage    Sleep apnea    Thyroid disease     Family History: Family History  Problem Relation Age of Onset   Colon cancer Paternal Grandfather    Ovarian cancer Paternal Grandmother 56  Hypertension Maternal Grandmother    Hypertension Father    Emphysema Father    ALS Mother    Hypertension Mother     Social History   Socioeconomic History   Marital status: Married    Spouse name: Not on file   Number of children: Not on file   Years of education: Not on file   Highest education level: Not on file  Occupational History   Not on file  Tobacco Use   Smoking status: Former    Packs/day: 0.50    Types: Cigarettes   Smokeless tobacco: Never   Tobacco comments:    Pt quit 1 month ago 10/22  Vaping Use   Vaping Use: Never used  Substance and Sexual Activity   Alcohol use: Not Currently     Comment: occasionally   Drug use: Never   Sexual activity: Yes    Birth control/protection: None  Other Topics Concern   Not on file  Social History Narrative   Not on file   Social Determinants of Health   Financial Resource Strain: Not on file  Food Insecurity: Not on file  Transportation Needs: Not on file  Physical Activity: Not on file  Stress: Not on file  Social Connections: Not on file  Intimate Partner Violence: Not on file      Review of Systems  Constitutional:  Negative for chills, fatigue and unexpected weight change.  HENT:  Negative for congestion, rhinorrhea, sneezing and sore throat.   Eyes:  Negative for redness.  Respiratory:  Negative for cough, chest tightness and shortness of breath.   Cardiovascular:  Negative for chest pain and palpitations.  Gastrointestinal:  Negative for abdominal pain, constipation, diarrhea, nausea and vomiting.  Genitourinary:  Negative for dysuria and frequency.  Musculoskeletal:  Negative for arthralgias, back pain, joint swelling and neck pain.  Skin:  Negative for rash.  Neurological: Negative.  Negative for tremors and numbness.  Hematological:  Negative for adenopathy. Does not bruise/bleed easily.  Psychiatric/Behavioral:  Negative for behavioral problems (Depression), sleep disturbance and suicidal ideas. The patient is not nervous/anxious.     Vital Signs: BP 90/64   Pulse 84   Temp 98.4 F (36.9 C)   Resp 16   Ht '5\' 6"'$  (1.676 m)   Wt 218 lb (98.9 kg)   LMP 04/18/2019 (Exact Date)   SpO2 97%   BMI 35.19 kg/m    Physical Exam Vitals and nursing note reviewed.  Constitutional:      General: She is not in acute distress.    Appearance: Normal appearance. She is obese. She is not ill-appearing.  HENT:     Head: Normocephalic and atraumatic.  Eyes:     Extraocular Movements: Extraocular movements intact.     Pupils: Pupils are equal, round, and reactive to light.  Cardiovascular:     Rate and Rhythm:  Normal rate and regular rhythm.  Pulmonary:     Effort: Pulmonary effort is normal. No respiratory distress.  Abdominal:     General: Abdomen is flat.  Musculoskeletal:        General: Normal range of motion.  Skin:    General: Skin is warm and dry.  Neurological:     Mental Status: She is alert and oriented to person, place, and time.     Cranial Nerves: No cranial nerve deficit.     Coordination: Coordination normal.     Gait: Gait normal.  Psychiatric:        Mood and Affect:  Mood normal.        Behavior: Behavior normal.        Assessment/Plan: 1. Acquired hypothyroidism Continue synthroid and will update labs - TSH + free T4  2. Restless leg syndrome - gabapentin (NEURONTIN) 300 MG capsule; Take 1 capsule (300 mg total) by mouth 2 (two) times daily.  Dispense: 60 capsule; Refill: 3 - rOPINIRole (REQUIP) 1 MG tablet; Take 1 tablet (1 mg total) by mouth at bedtime.  Dispense: 30 tablet; Refill: 2  3. Chronic midline low back pain without sciatica - gabapentin (NEURONTIN) 300 MG capsule; Take 1 capsule (300 mg total) by mouth 2 (two) times daily.  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 for sleep  Dispense: 30 tablet; Refill: 2  5. Mixed hyperlipidemia - Lipid Panel With LDL/HDL Ratio  6. Prediabetes - Hgb A1C w/o eAG  7. Other fatigue - CBC w/Diff/Platelet - Comprehensive metabolic panel - TSH + free T4 - Lipid Panel With LDL/HDL Ratio - Hgb A1C w/o eAG - Fe+TIBC+Fer   General Counseling: Maedell verbalizes understanding of the findings of todays visit and agrees with plan of treatment. I have discussed any further diagnostic evaluation that may be needed or ordered today. We also reviewed her  medications today. she has been encouraged to call the office with any questions or concerns that should arise related to todays visit.    Orders Placed This Encounter  Procedures   CBC w/Diff/Platelet   Comprehensive metabolic panel   TSH + free T4   Lipid Panel With LDL/HDL Ratio   Hgb A1C w/o eAG   Fe+TIBC+Fer    Meds ordered this encounter  Medications   gabapentin (NEURONTIN) 300 MG capsule    Sig: Take 1 capsule (300 mg total) by mouth 2 (two) times daily.    Dispense:  60 capsule    Refill:  3   rOPINIRole (REQUIP) 1 MG tablet    Sig: Take 1 tablet (1 mg total) by mouth at bedtime.    Dispense:  30 tablet    Refill:  2   tiZANidine (ZANAFLEX) 4 MG tablet    Sig: TAKE 1/2 TO 1 TABLET BY MOUTH 2 TIMES DAILY AS NEEDED FOR MUSCLE SPASMS    Dispense:  60 tablet    Refill:  2   zolpidem (AMBIEN CR) 12.5 MG CR tablet    Sig: Take 1 tablet (12.5 mg total) by mouth at bedtime as needed. for sleep    Dispense:  30 tablet    Refill:  2    This patient was seen by Drema Dallas, PA-C in collaboration with Dr. Clayborn Bigness as a part of collaborative care agreement.   Total time spent:30 Minutes Time spent includes review of chart, medications, test results, and follow up plan with the patient.      Dr Lavera Guise Internal medicine

## 2022-06-01 ENCOUNTER — Encounter: Payer: Self-pay | Admitting: Physician Assistant

## 2022-06-08 ENCOUNTER — Encounter: Payer: Self-pay | Admitting: Physician Assistant

## 2022-06-22 ENCOUNTER — Encounter: Payer: Self-pay | Admitting: Physician Assistant

## 2022-06-26 ENCOUNTER — Other Ambulatory Visit: Payer: Self-pay

## 2022-06-26 DIAGNOSIS — F411 Generalized anxiety disorder: Secondary | ICD-10-CM

## 2022-06-26 MED ORDER — HYDROXYZINE HCL 50 MG PO TABS: 50.0000 mg | ORAL_TABLET | Freq: Three times a day (TID) | ORAL | 2 refills | Status: DC | PRN

## 2022-07-10 ENCOUNTER — Telehealth: Payer: Self-pay | Admitting: Physician Assistant

## 2022-07-10 ENCOUNTER — Encounter: Payer: Self-pay | Admitting: Physician Assistant

## 2022-07-10 VITALS — Resp 16

## 2022-07-10 DIAGNOSIS — J0100 Acute maxillary sinusitis, unspecified: Secondary | ICD-10-CM

## 2022-07-10 MED ORDER — AZITHROMYCIN 250 MG PO TABS: ORAL_TABLET | ORAL | 0 refills | Status: AC

## 2022-07-10 NOTE — Progress Notes (Signed)
[facility] [place], [place] [address]  Internal MEDICINE  Telephone Visit  Patient Name: Stephanie Larson  [id number]  [id number]  Date of Service: 07/10/2022  I connected with the patient at 2:43 by telephone and verified the patients identity using two identifiers.   I discussed the limitations, risks, security and privacy concerns of performing an evaluation and management service by telephone and the availability of in person appointments. I also discussed with the patient that there may be a patient responsible charge related to the service.  The patient expressed understanding and agrees to proceed.    Chief Complaint  Patient presents with   Telephone Screen    [phone], prefers telephone call. Patient has not test for Covid/Flu.   Telephone Assessment   Sinusitis    Blowing green mucus out of nose   Headache    HPI Pt is here for virtual sick visit -symptoms started 2 weeks ago with feeling like she had a cold and then started getting better, but then 2 days ago mucus turned green and started having worsening congestion and sinus headaches.  -Not having much cough, seems to all be in her sinuses. A little ear pressure on the left started last night -She is taking her flonase, allegra, and singulair. She also tried Tukol for cough and congestion -No flu or covid testing  Current Medication: Outpatient Encounter Medications as of 07/10/2022  Medication Sig   atorvastatin (LIPITOR) 10 MG tablet Take 1 tablet (10 mg total) by mouth daily.   azithromycin (ZITHROMAX) 250 MG tablet Take 2 tablets on day 1, then 1 tablet daily on days 2 through 5   chlorhexidine (PERIDEX) 0.12 % solution Use as directed 10 mLs in the mouth or throat 2 (two) times daily.   fexofenadine (ALLEGRA) 180 MG tablet Take 1 tablet (180 mg total) by mouth daily.   gabapentin (NEURONTIN) 300 MG capsule Take 1 capsule (300 mg total) by mouth 2 (two) times daily.   hydrOXYzine  (ATARAX) 50 MG tablet Take 1 tablet (50 mg total) by mouth 3 (three) times daily as needed.   levothyroxine (SYNTHROID) 175 MCG tablet Take 1 tablet (175 mcg total) by mouth daily.   mometasone (ELOCON) 0.1 % cream APPLY TO AFFECTED AREAS FROM FACE TO LOWER EXTREMITIES TWICE A DAY TO SPOT TREAT DURING FLARES   montelukast (SINGULAIR) 10 MG tablet Take 1 tablet (10 mg total) by mouth at bedtime.   rOPINIRole (REQUIP) 1 MG tablet Take 1 tablet (1 mg total) by mouth at bedtime.   tiZANidine (ZANAFLEX) 4 MG tablet TAKE 1/2 TO 1 TABLET BY MOUTH 2 TIMES DAILY AS NEEDED FOR MUSCLE SPASMS   zolpidem (AMBIEN CR) 12.5 MG CR tablet Take 1 tablet (12.5 mg total) by mouth at bedtime as needed. for sleep   No facility-administered encounter medications on file as of 07/10/2022.    Surgical History: Past Surgical History:  Procedure Laterality Date   CYSTOSCOPY N/A 05/13/2019   Procedure: CYSTOSCOPY;  Surgeon: Will Bonnet, MD;  Location: [facility] ORS;  Service: Gynecology;  Laterality: N/A;   LAPAROSCOPIC GASTRIC BANDING     TOTAL LAPAROSCOPIC HYSTERECTOMY WITH SALPINGECTOMY N/A 05/13/2019   Procedure: TOTAL LAPAROSCOPIC HYSTERECTOMY WITH BILATERAL SALPINGECTOMY;  Surgeon: Will Bonnet, MD;  Location: [facility] ORS;  Service: Gynecology;  Laterality: N/A;   TUBAL LIGATION      Medical History: Past Medical History:  Diagnosis Date   Anxiety    Depression    Family history  of ovarian cancer    qualifies for cancer genetic testing   Hypothyroidism    Insomnia    Memory changes    Sinus drainage    Sleep apnea    Thyroid disease     Family History: Family History  Problem Relation Age of Onset   Colon cancer Paternal Grandfather    Ovarian cancer Paternal Grandmother 60   Hypertension Maternal Grandmother    Hypertension Father    Emphysema Father    ALS Mother    Hypertension Mother     Social History   Socioeconomic History   Marital status: Married    Spouse name: Not on file    Number of children: Not on file   Years of education: Not on file   Highest education level: Not on file  Occupational History   Not on file  Tobacco Use   Smoking status: Former    Packs/day: 0.50    Types: Cigarettes   Smokeless tobacco: Never   Tobacco comments:    Pt quit 1 month ago 10/22  Vaping Use   Vaping Use: Never used  Substance and Sexual Activity   Alcohol use: Not Currently    Comment: occasionally   Drug use: Never   Sexual activity: Yes    Birth control/protection: None  Other Topics Concern   Not on file  Social History Narrative   Not on file   Social Determinants of Health   Financial Resource Strain: Not on file  Food Insecurity: Not on file  Transportation Needs: Not on file  Physical Activity: Not on file  Stress: Not on file  Social Connections: Not on file  Intimate Partner Violence: Not on file      Review of Systems  Constitutional:  Negative for fatigue and fever.  HENT:  Positive for congestion, ear pain, postnasal drip, sinus pressure and sinus pain. Negative for mouth sores.   Respiratory:  Negative for cough, shortness of breath and wheezing.   Cardiovascular:  Negative for chest pain.  Genitourinary:  Negative for flank pain.  Neurological:  Positive for headaches.  Psychiatric/Behavioral: Negative.      Vital Signs: Resp 16   LMP 04/18/2019 (Exact Date)    Observation/Objective:  Pt is able to carry out conversation  Assessment/Plan: 1. Acute non-recurrent maxillary sinusitis Will start on zpak and may continue OTC medications including flonase and will rest and stay hydrated. - azithromycin (ZITHROMAX) 250 MG tablet; Take 2 tablets on day 1, then 1 tablet daily on days 2 through 5  Dispense: 6 tablet; Refill: 0   General Counseling: Laylia verbalizes understanding of the findings of today's phone visit and agrees with plan of treatment. I have discussed any further diagnostic evaluation that may be needed or ordered  today. We also reviewed her medications today. she has been encouraged to call the office with any questions or concerns that should arise related to todays visit.    No orders of the defined types were placed in this encounter.   Meds ordered this encounter  Medications   azithromycin (ZITHROMAX) 250 MG tablet    Sig: Take 2 tablets on day 1, then 1 tablet daily on days 2 through 5    Dispense:  6 tablet    Refill:  0    Time spent:25 Minutes    Dr Lavera Guise Internal medicine

## 2022-07-29 LAB — COMPREHENSIVE METABOLIC PANEL
ALT: 45 IU/L — ABNORMAL HIGH (ref 0–32)
AST: 36 IU/L (ref 0–40)
Albumin/Globulin Ratio: 2.3 — ABNORMAL HIGH (ref 1.2–2.2)
Albumin: 4.5 g/dL (ref 3.9–4.9)
Alkaline Phosphatase: 101 IU/L (ref 44–121)
BUN/Creatinine Ratio: 15 (ref 9–23)
BUN: 16 mg/dL (ref 6–24)
Bilirubin Total: 1.1 mg/dL (ref 0.0–1.2)
CO2: 23 mmol/L (ref 20–29)
Calcium: 9.6 mg/dL (ref 8.7–10.2)
Chloride: 103 mmol/L (ref 96–106)
Creatinine, Ser: 1.1 mg/dL — ABNORMAL HIGH (ref 0.57–1.00)
Globulin, Total: 2 g/dL (ref 1.5–4.5)
Glucose: 112 mg/dL — ABNORMAL HIGH (ref 70–99)
Potassium: 3.9 mmol/L (ref 3.5–5.2)
Sodium: 142 mmol/L (ref 134–144)
Total Protein: 6.5 g/dL (ref 6.0–8.5)
eGFR: 61 mL/min/{1.73_m2} (ref >59)

## 2022-07-29 LAB — CBC WITH DIFFERENTIAL/PLATELET
Basophils Absolute: 0.1 10*3/uL (ref 0.0–0.2)
Basos: 1 %
EOS (ABSOLUTE): 0 10*3/uL (ref 0.0–0.4)
Eos: 0 %
Hematocrit: 34.1 % (ref 34.0–46.6)
Hemoglobin: 11.6 g/dL (ref 11.1–15.9)
Immature Grans (Abs): 0 10*3/uL (ref 0.0–0.1)
Immature Granulocytes: 0 %
Lymphocytes Absolute: 2.2 10*3/uL (ref 0.7–3.1)
Lymphs: 25 %
MCH: 27.2 pg (ref 26.6–33.0)
MCHC: 34 g/dL (ref 31.5–35.7)
MCV: 80 fL (ref 79–97)
Monocytes Absolute: 0.7 10*3/uL (ref 0.1–0.9)
Monocytes: 8 %
Neutrophils Absolute: 5.9 10*3/uL (ref 1.4–7.0)
Neutrophils: 66 %
Platelets: 386 10*3/uL (ref 150–450)
RBC: 4.26 x10E6/uL (ref 3.77–5.28)
RDW: 13 % (ref 11.7–15.4)
WBC: 8.9 10*3/uL (ref 3.4–10.8)

## 2022-07-29 LAB — HGB A1C W/O EAG: Hgb A1c MFr Bld: 6.7 % — ABNORMAL HIGH (ref 4.8–5.6)

## 2022-07-29 LAB — IRON,TIBC AND FERRITIN PANEL
Ferritin: 251 ng/mL — ABNORMAL HIGH (ref 15–150)
Iron Saturation: 41 % (ref 15–55)
Iron: 110 ug/dL (ref 27–159)
Total Iron Binding Capacity: 270 ug/dL (ref 250–450)
UIBC: 160 ug/dL (ref 131–425)

## 2022-07-29 LAB — TSH+FREE T4
Free T4: 1.64 ng/dL (ref 0.82–1.77)
TSH: 0.013 u[IU]/mL — ABNORMAL LOW (ref 0.450–4.500)

## 2022-07-29 LAB — LIPID PANEL WITH LDL/HDL RATIO
Cholesterol, Total: 191 mg/dL (ref 100–199)
HDL: 76 mg/dL (ref >39)
LDL Chol Calc (NIH): 91 mg/dL (ref 0–99)
LDL/HDL Ratio: 1.2 ratio (ref 0.0–3.2)
Triglycerides: 138 mg/dL (ref 0–149)
VLDL Cholesterol Cal: 24 mg/dL (ref 5–40)

## 2022-08-03 ENCOUNTER — Ambulatory Visit: Payer: Self-pay | Admitting: Physician Assistant

## 2022-08-03 ENCOUNTER — Encounter: Payer: Self-pay | Admitting: Physician Assistant

## 2022-08-03 VITALS — BP 154/88 | HR 68 | Temp 97.7°F | Resp 16 | Ht 66.0 in | Wt 222.4 lb

## 2022-08-03 DIAGNOSIS — M545 Low back pain, unspecified: Secondary | ICD-10-CM

## 2022-08-03 DIAGNOSIS — E782 Mixed hyperlipidemia: Secondary | ICD-10-CM

## 2022-08-03 DIAGNOSIS — F5105 Insomnia due to other mental disorder: Secondary | ICD-10-CM

## 2022-08-03 DIAGNOSIS — G8929 Other chronic pain: Secondary | ICD-10-CM

## 2022-08-03 DIAGNOSIS — Z0001 Encounter for general adult medical examination with abnormal findings: Secondary | ICD-10-CM

## 2022-08-03 DIAGNOSIS — G2581 Restless legs syndrome: Secondary | ICD-10-CM

## 2022-08-03 DIAGNOSIS — J301 Allergic rhinitis due to pollen: Secondary | ICD-10-CM

## 2022-08-03 DIAGNOSIS — F409 Phobic anxiety disorder, unspecified: Secondary | ICD-10-CM

## 2022-08-03 DIAGNOSIS — R300 Dysuria: Secondary | ICD-10-CM

## 2022-08-03 DIAGNOSIS — E039 Hypothyroidism, unspecified: Secondary | ICD-10-CM

## 2022-08-03 DIAGNOSIS — R030 Elevated blood-pressure reading, without diagnosis of hypertension: Secondary | ICD-10-CM

## 2022-08-03 DIAGNOSIS — E1165 Type 2 diabetes mellitus with hyperglycemia: Secondary | ICD-10-CM

## 2022-08-03 MED ORDER — GABAPENTIN 300 MG PO CAPS
300.0000 mg | ORAL_CAPSULE | Freq: Two times a day (BID) | ORAL | 3 refills | Status: DC
Start: 2022-08-03 — End: 2022-10-26

## 2022-08-03 MED ORDER — ROPINIROLE HCL 1 MG PO TABS: 1.0000 mg | ORAL_TABLET | Freq: Every day | ORAL | 2 refills | Status: DC

## 2022-08-03 MED ORDER — MONTELUKAST SODIUM 10 MG PO TABS: 10.0000 mg | ORAL_TABLET | Freq: Every day | ORAL | 1 refills | Status: DC

## 2022-08-03 MED ORDER — ZOLPIDEM TARTRATE ER 12.5 MG PO TBCR: 12.5000 mg | EXTENDED_RELEASE_TABLET | Freq: Every evening | ORAL | 2 refills | Status: DC | PRN

## 2022-08-03 MED ORDER — TIZANIDINE HCL 4 MG PO TABS: ORAL_TABLET | ORAL | 2 refills | Status: DC

## 2022-08-03 MED ORDER — LEVOTHYROXINE SODIUM 175 MCG PO TABS
175.0000 ug | ORAL_TABLET | Freq: Every day | ORAL | 3 refills | Status: DC
Start: 2022-08-03 — End: 2023-07-19

## 2022-08-03 MED ORDER — ATORVASTATIN CALCIUM 10 MG PO TABS: 10.0000 mg | ORAL_TABLET | Freq: Every day | ORAL | 3 refills | Status: DC

## 2022-08-03 NOTE — Progress Notes (Signed)
[facility] [address] [place], [place] [address]  Internal MEDICINE  Office Visit Note  Patient Name: Stephanie Larson  [id number]  [id number]  Date of Service: 08/09/2022  Chief Complaint  Patient presents with   Annual Exam   Anxiety   Depression   Quality Metric Gaps    Colonoscopy     HPI Pt is here for routine health maintenance examination -Going to chiropractor for sciatica along right side, this is driving BP up currently -Reviwed labs--now type 2 diabetic and will get monitor to check BG at home, creatine slightly elevated again, Tsh low, but free T4 in range and improved from previously. Pt states she is still tired and will continue current synthroid for now. Cholesterol greatly improved -Declines colonoscopy and cologuard as well as mammogram. Will contact if ready to schedule -Due for refills  Current Medication: Outpatient Encounter Medications as of 08/03/2022  Medication Sig   chlorhexidine (PERIDEX) 0.12 % solution Use as directed 10 mLs in the mouth or throat 2 (two) times daily.   fexofenadine (ALLEGRA) 180 MG tablet Take 1 tablet (180 mg total) by mouth daily.   hydrOXYzine (ATARAX) 50 MG tablet Take 1 tablet (50 mg total) by mouth 3 (three) times daily as needed.   mometasone (ELOCON) 0.1 % cream APPLY TO AFFECTED AREAS FROM FACE TO LOWER EXTREMITIES TWICE A DAY TO SPOT TREAT DURING FLARES   [DISCONTINUED] atorvastatin (LIPITOR) 10 MG tablet Take 1 tablet (10 mg total) by mouth daily.   [DISCONTINUED] gabapentin (NEURONTIN) 300 MG capsule Take 1 capsule (300 mg total) by mouth 2 (two) times daily.   [DISCONTINUED] levothyroxine (SYNTHROID) 175 MCG tablet Take 1 tablet (175 mcg total) by mouth daily.   [DISCONTINUED] montelukast (SINGULAIR) 10 MG tablet Take 1 tablet (10 mg total) by mouth at bedtime.   [DISCONTINUED] rOPINIRole (REQUIP) 1 MG tablet Take 1 tablet (1 mg total) by mouth at bedtime.   [DISCONTINUED] tiZANidine (ZANAFLEX) 4 MG tablet  TAKE 1/2 TO 1 TABLET BY MOUTH 2 TIMES DAILY AS NEEDED FOR MUSCLE SPASMS   [DISCONTINUED] zolpidem (AMBIEN CR) 12.5 MG CR tablet Take 1 tablet (12.5 mg total) by mouth at bedtime as needed. for sleep   atorvastatin (LIPITOR) 10 MG tablet Take 1 tablet (10 mg total) by mouth daily.   gabapentin (NEURONTIN) 300 MG capsule Take 1 capsule (300 mg total) by mouth 2 (two) times daily.   levothyroxine (SYNTHROID) 175 MCG tablet Take 1 tablet (175 mcg total) by mouth daily.   montelukast (SINGULAIR) 10 MG tablet Take 1 tablet (10 mg total) by mouth at bedtime.   rOPINIRole (REQUIP) 1 MG tablet Take 1 tablet (1 mg total) by mouth at bedtime.   tiZANidine (ZANAFLEX) 4 MG tablet TAKE 1/2 TO 1 TABLET BY MOUTH 2 TIMES DAILY AS NEEDED FOR MUSCLE SPASMS   [START ON 08/25/2022] zolpidem (AMBIEN CR) 12.5 MG CR tablet Take 1 tablet (12.5 mg total) by mouth at bedtime as needed. for sleep   No facility-administered encounter medications on file as of 08/03/2022.    Surgical History: Past Surgical History:  Procedure Laterality Date   CYSTOSCOPY N/A 05/13/2019   Procedure: CYSTOSCOPY;  Surgeon: Conard Novak, MD;  Location: [facility] ORS;  Service: Gynecology;  Laterality: N/A;   LAPAROSCOPIC GASTRIC BANDING     TOTAL LAPAROSCOPIC HYSTERECTOMY WITH SALPINGECTOMY N/A 05/13/2019   Procedure: TOTAL LAPAROSCOPIC HYSTERECTOMY WITH BILATERAL SALPINGECTOMY;  Surgeon: Conard Novak, MD;  Location: [facility] OR[facility];  Service: Gynecology;  Laterality:  N/A;   TUBAL LIGATION      Medical History: Past Medical History:  Diagnosis Date   Anxiety    Depression    Family history of ovarian cancer    qualifies for cancer genetic testing   Hypothyroidism    Insomnia    Memory changes    Sinus drainage    Sleep apnea    Thyroid disease     Family History: Family History  Problem Relation Age of Onset   Colon cancer Paternal Grandfather    Ovarian cancer Paternal Grandmother 69   Hypertension Maternal Grandmother     Hypertension Father    Emphysema Father    ALS Mother    Hypertension Mother       Review of Systems  Constitutional:  Negative for chills, fatigue and unexpected weight change.  HENT:  Negative for congestion, rhinorrhea, sneezing and sore throat.   Eyes:  Negative for redness.  Respiratory:  Negative for cough, chest tightness and shortness of breath.   Cardiovascular:  Negative for chest pain and palpitations.  Gastrointestinal:  Negative for abdominal pain, constipation, diarrhea, nausea and vomiting.  Genitourinary:  Negative for dysuria and frequency.  Musculoskeletal:  Positive for back pain. Negative for arthralgias, joint swelling and neck pain.  Skin:  Negative for rash.  Neurological: Negative.  Negative for tremors and numbness.  Hematological:  Negative for adenopathy. Does not bruise/bleed easily.  Psychiatric/Behavioral:  Negative for behavioral problems (Depression), sleep disturbance and suicidal ideas. The patient is not nervous/anxious.      Vital Signs: BP (!) 154/88 Comment: 169/101  Pulse 68   Temp 97.7 F (36.5 C)   Resp 16   Ht 5\' 6"  (1.676 m)   Wt 222 lb 6.4 oz (100.9 kg)   LMP 04/18/2019 (Exact Date)   SpO2 98%   BMI 35.90 kg/m    Physical Exam Vitals and nursing note reviewed.  Constitutional:      General: She is not in acute distress.    Appearance: Normal appearance. She is obese. She is not ill-appearing.  HENT:     Head: Normocephalic and atraumatic.  Eyes:     Extraocular Movements: Extraocular movements intact.     Pupils: Pupils are equal, round, and reactive to light.  Cardiovascular:     Rate and Rhythm: Normal rate and regular rhythm.  Pulmonary:     Effort: Pulmonary effort is normal. No respiratory distress.  Chest:     Chest wall: No tenderness.  Breasts:    Right: Normal. No mass.     Left: Normal. No mass.  Abdominal:     General: Abdomen is flat.     Tenderness: There is no abdominal tenderness.  Musculoskeletal:         General: Normal range of motion.  Skin:    General: Skin is warm and dry.  Neurological:     Mental Status: She is alert and oriented to person, place, and time.     Cranial Nerves: No cranial nerve deficit.     Coordination: Coordination normal.     Gait: Gait normal.  Psychiatric:        Mood and Affect: Mood normal.        Behavior: Behavior normal.      LABS: Recent Results (from the past 2160 hour(s))  CBC w/Diff/Platelet     Status: None   Collection Time: 07/28/22  3:12 PM  Result Value Ref Range   WBC 8.9 3.4 - 10.8 x10E3/uL  RBC 4.26 3.77 - 5.28 x10E6/uL   Hemoglobin 11.6 11.1 - 15.9 g/dL   Hematocrit 82.9 56.2 - 46.6 %   MCV 80 79 - 97 fL   MCH 27.2 26.6 - 33.0 pg   MCHC 34.0 31.5 - 35.7 g/dL   RDW 13.0 86.5 - 78.4 %   Platelets 386 150 - 450 x10E3/uL   Neutrophils 66 Not Estab. %   Lymphs 25 Not Estab. %   Monocytes 8 Not Estab. %   Eos 0 Not Estab. %   Basos 1 Not Estab. %   Neutrophils Absolute 5.9 1.4 - 7.0 x10E3/uL   Lymphocytes Absolute 2.2 0.7 - 3.1 x10E3/uL   Monocytes Absolute 0.7 0.1 - 0.9 x10E3/uL   EOS (ABSOLUTE) 0.0 0.0 - 0.4 x10E3/uL   Basophils Absolute 0.1 0.0 - 0.2 x10E3/uL   Immature Granulocytes 0 Not Estab. %   Immature Grans (Abs) 0.0 0.0 - 0.1 x10E3/uL  Comprehensive metabolic panel     Status: Abnormal   Collection Time: 07/28/22  3:12 PM  Result Value Ref Range   Glucose 112 (H) 70 - 99 mg/dL   BUN 16 6 - 24 mg/dL   Creatinine, Ser 6.96 (H) 0.57 - 1.00 mg/dL   eGFR 61 >29 BM/WUX/3.24   BUN/Creatinine Ratio 15 9 - 23   Sodium 142 134 - 144 mmol/L   Potassium 3.9 3.5 - 5.2 mmol/L   Chloride 103 96 - 106 mmol/L   CO2 23 20 - 29 mmol/L   Calcium 9.6 8.7 - 10.2 mg/dL   Total Protein 6.5 6.0 - 8.5 g/dL   Albumin 4.5 3.9 - 4.9 g/dL   Globulin, Total 2.0 1.5 - 4.5 g/dL   Albumin/Globulin Ratio 2.3 (H) 1.2 - 2.2   Bilirubin Total 1.1 0.0 - 1.2 mg/dL   Alkaline Phosphatase 101 44 - 121 IU/L   AST 36 0 - 40 IU/L   ALT 45 (H)  0 - 32 IU/L  TSH + free T4     Status: Abnormal   Collection Time: 07/28/22  3:12 PM  Result Value Ref Range   TSH 0.013 (L) 0.450 - 4.500 uIU/mL   Free T4 1.64 0.82 - 1.77 ng/dL  Lipid Panel With LDL/HDL Ratio     Status: None   Collection Time: 07/28/22  3:12 PM  Result Value Ref Range   Cholesterol, Total 191 100 - 199 mg/dL   Triglycerides 401 0 - 149 mg/dL   HDL 76 >02 mg/dL   VLDL Cholesterol Cal 24 5 - 40 mg/dL   LDL Chol Calc (NIH) 91 0 - 99 mg/dL   LDL/HDL Ratio 1.2 0.0 - 3.2 ratio    Comment:                                     LDL/HDL Ratio                                             Men  Women                               1/2 Avg.Risk  1.0    1.5  Avg.Risk  3.6    3.2                                2X Avg.Risk  6.2    5.0                                3X Avg.Risk  8.0    6.1   Hgb A1C w/o eAG     Status: Abnormal   Collection Time: 07/28/22  3:12 PM  Result Value Ref Range   Hgb A1c MFr Bld 6.7 (H) 4.8 - 5.6 %    Comment:          Prediabetes: 5.7 - 6.4          Diabetes: >6.4          Glycemic control for adults with diabetes: <7.0   Fe+TIBC+Fer     Status: Abnormal   Collection Time: 07/28/22  3:12 PM  Result Value Ref Range   Total Iron Binding Capacity 270 250 - 450 ug/dL   UIBC 409 811 - 914 ug/dL   Iron 782 27 - 956 ug/dL   Iron Saturation 41 15 - 55 %   Ferritin 251 (H) 15 - 150 ng/mL  UA/M w/rflx Culture, Routine     Status: None   Collection Time: 08/03/22  3:29 PM   Specimen: Urine   Urine  Result Value Ref Range   Specific Gravity, UA 1.015 1.005 - 1.030   pH, UA 5.5 5.0 - 7.5   Color, UA Yellow Yellow   Appearance Ur Clear Clear   Leukocytes,UA Negative Negative   Protein,UA Negative Negative/Trace   Glucose, UA Negative Negative   Ketones, UA Negative Negative   RBC, UA Negative Negative   Bilirubin, UA Negative Negative   Urobilinogen, Ur 0.2 0.2 - 1.0 mg/dL   Nitrite, UA Negative Negative    Microscopic Examination Comment     Comment: Microscopic follows if indicated.   Microscopic Examination See below:     Comment: Microscopic was indicated and was performed.   Urinalysis Reflex Comment     Comment: This specimen will not reflex to a Urine Culture.  Microscopic Examination     Status: Abnormal   Collection Time: 08/03/22  3:29 PM   Urine  Result Value Ref Range   WBC, UA 0-5 0 - 5 /hpf   RBC, Urine None seen 0 - 2 /hpf   Epithelial Cells (non renal) 0-10 0 - 10 /hpf   Casts None seen None seen /lpf   Crystals Present (A) N/A   Crystal Type Calcium Oxalate N/A   Bacteria, UA None seen None seen/Few        Assessment/Plan: 1. Encounter for general adult medical examination with abnormal findings CPE performed, routine labs reviewed, patient declines colon screening and mammogram at this time  2. Type 2 diabetes mellitus with hyperglycemia, without long-term current use of insulin A1c now in diabetic range and patient will obtain glucose monitor to start checking at home and work on diet and exercise  3. Elevated BP without diagnosis of hypertension Likely due to acute sciatic pain which is being addressed by chiropractor currently.  Patient will monitor  4. Chronic midline low back pain without sciatica May continue medication as before.  Patient does have acute sciatica at this time being addressed by chiropractor - tiZANidine (ZANAFLEX)  4 MG tablet; TAKE 1/2 TO 1 TABLET BY MOUTH 2 TIMES DAILY AS NEEDED FOR MUSCLE SPASMS  Dispense: 60 tablet; Refill: 2 - gabapentin (NEURONTIN) 300 MG capsule; Take 1 capsule (300 mg total) by mouth 2 (two) times daily.  Dispense: 60 capsule; Refill: 3  5. Seasonal allergic rhinitis due to pollen - montelukast (SINGULAIR) 10 MG tablet; Take 1 tablet (10 mg total) by mouth at bedtime.  Dispense: 90 tablet; Refill: 1  6. Restless leg syndrome - rOPINIRole (REQUIP) 1 MG tablet; Take 1 tablet (1 mg total) by mouth at bedtime.   Dispense: 30 tablet; Refill: 2 - gabapentin (NEURONTIN) 300 MG capsule; Take 1 capsule (300 mg total) by mouth 2 (two) times daily.  Dispense: 60 capsule; Refill: 3  7. Insomnia due to anxiety and fear - zolpidem (AMBIEN CR) 12.5 MG CR tablet; Take 1 tablet (12.5 mg total) by mouth at bedtime as needed. for sleep  Dispense: 30 tablet; Refill: 2  8. Acquired hypothyroidism - levothyroxine (SYNTHROID) 175 MCG tablet; Take 1 tablet (175 mcg total) by mouth daily.  Dispense: 90 tablet; Refill: 3  9. Mixed hyperlipidemia - atorvastatin (LIPITOR) 10 MG tablet; Take 1 tablet (10 mg total) by mouth daily.  Dispense: 90 tablet; Refill: 3  10. Dysuria - UA/M w/rflx Culture, Routine - Microscopic Examination   General Counseling: Stephanie Larson verbalizes understanding of the findings of todays visit and agrees with plan of treatment. I have discussed any further diagnostic evaluation that may be needed or ordered today. We also reviewed her medications today. she has been encouraged to call the office with any questions or concerns that should arise related to todays visit.    Counseling:    Orders Placed This Encounter  Procedures   Microscopic Examination   UA/M w/rflx Culture, Routine    Meds ordered this encounter  Medications   montelukast (SINGULAIR) 10 MG tablet    Sig: Take 1 tablet (10 mg total) by mouth at bedtime.    Dispense:  90 tablet    Refill:  1   rOPINIRole (REQUIP) 1 MG tablet    Sig: Take 1 tablet (1 mg total) by mouth at bedtime.    Dispense:  30 tablet    Refill:  2   tiZANidine (ZANAFLEX) 4 MG tablet    Sig: TAKE 1/2 TO 1 TABLET BY MOUTH 2 TIMES DAILY AS NEEDED FOR MUSCLE SPASMS    Dispense:  60 tablet    Refill:  2   zolpidem (AMBIEN CR) 12.5 MG CR tablet    Sig: Take 1 tablet (12.5 mg total) by mouth at bedtime as needed. for sleep    Dispense:  30 tablet    Refill:  2   levothyroxine (SYNTHROID) 175 MCG tablet    Sig: Take 1 tablet (175 mcg total) by mouth  daily.    Dispense:  90 tablet    Refill:  3   gabapentin (NEURONTIN) 300 MG capsule    Sig: Take 1 capsule (300 mg total) by mouth 2 (two) times daily.    Dispense:  60 capsule    Refill:  3   atorvastatin (LIPITOR) 10 MG tablet    Sig: Take 1 tablet (10 mg total) by mouth daily.    Dispense:  90 tablet    Refill:  3    This patient was seen by Lynn Ito, PA-C in collaboration with Dr. Beverely Risen as a part of collaborative care agreement.  Total time spent:35 Minutes  Time spent  includes review of chart, medications, test results, and follow up plan with the patient.     Lavera Guise, MD  Internal Medicine

## 2022-08-04 LAB — MICROSCOPIC EXAMINATION
Bacteria, UA: NONE SEEN
Casts: NONE SEEN /lpf
RBC, Urine: NONE SEEN /hpf (ref 0–2)

## 2022-08-04 LAB — UA/M W/RFLX CULTURE, ROUTINE
Bilirubin, UA: NEGATIVE
Glucose, UA: NEGATIVE
Ketones, UA: NEGATIVE
Leukocytes,UA: NEGATIVE
Nitrite, UA: NEGATIVE
Protein,UA: NEGATIVE
RBC, UA: NEGATIVE
Specific Gravity, UA: 1.015 (ref 1.005–1.030)
Urobilinogen, Ur: 0.2 mg/dL (ref 0.2–1.0)
pH, UA: 5.5 (ref 5.0–7.5)

## 2022-08-28 ENCOUNTER — Telehealth: Payer: Self-pay | Admitting: Physician Assistant

## 2022-09-01 ENCOUNTER — Telehealth: Payer: Self-pay | Admitting: Physician Assistant

## 2022-09-01 NOTE — Telephone Encounter (Signed)
Patient called requesting back MRI. Per Lauren, I lvm for patient to have her orthopedist order the test-Stephanie Larson

## 2022-09-01 NOTE — Telephone Encounter (Signed)
Error

## 2022-09-19 ENCOUNTER — Other Ambulatory Visit: Payer: Self-pay | Admitting: Physician Assistant

## 2022-09-19 DIAGNOSIS — F411 Generalized anxiety disorder: Secondary | ICD-10-CM

## 2022-10-26 ENCOUNTER — Encounter: Payer: Self-pay | Admitting: Physician Assistant

## 2022-10-26 ENCOUNTER — Ambulatory Visit: Payer: Self-pay | Admitting: Physician Assistant

## 2022-10-26 VITALS — BP 139/81 | HR 74 | Temp 98.1°F | Resp 16 | Ht 66.0 in | Wt 220.6 lb

## 2022-10-26 DIAGNOSIS — G8929 Other chronic pain: Secondary | ICD-10-CM

## 2022-10-26 DIAGNOSIS — F411 Generalized anxiety disorder: Secondary | ICD-10-CM

## 2022-10-26 DIAGNOSIS — J301 Allergic rhinitis due to pollen: Secondary | ICD-10-CM

## 2022-10-26 DIAGNOSIS — G2581 Restless legs syndrome: Secondary | ICD-10-CM

## 2022-10-26 DIAGNOSIS — F409 Phobic anxiety disorder, unspecified: Secondary | ICD-10-CM

## 2022-10-26 DIAGNOSIS — E039 Hypothyroidism, unspecified: Secondary | ICD-10-CM

## 2022-10-26 DIAGNOSIS — F5105 Insomnia due to other mental disorder: Secondary | ICD-10-CM

## 2022-10-26 DIAGNOSIS — M545 Low back pain, unspecified: Secondary | ICD-10-CM

## 2022-10-26 MED ORDER — TIZANIDINE HCL 4 MG PO TABS: ORAL_TABLET | ORAL | 2 refills | Status: DC

## 2022-10-26 MED ORDER — MONTELUKAST SODIUM 10 MG PO TABS
10.0000 mg | ORAL_TABLET | Freq: Every day | ORAL | 1 refills | Status: DC
Start: 2022-10-26 — End: 2023-04-19

## 2022-10-26 MED ORDER — ROPINIROLE HCL 1 MG PO TABS
1.0000 mg | ORAL_TABLET | Freq: Every day | ORAL | 2 refills | Status: DC
Start: 2022-10-26 — End: 2023-01-15

## 2022-10-26 MED ORDER — GABAPENTIN 300 MG PO CAPS: 300.0000 mg | ORAL_CAPSULE | Freq: Two times a day (BID) | ORAL | 2 refills | Status: DC

## 2022-10-26 MED ORDER — ZOLPIDEM TARTRATE ER 12.5 MG PO TBCR: 12.5000 mg | EXTENDED_RELEASE_TABLET | Freq: Every evening | ORAL | 1 refills | Status: DC | PRN

## 2022-10-26 MED ORDER — HYDROXYZINE HCL 50 MG PO TABS: 50.0000 mg | ORAL_TABLET | Freq: Three times a day (TID) | ORAL | 2 refills | Status: DC | PRN

## 2022-10-26 NOTE — Progress Notes (Signed)
[facility] [address] [place], [place] [address]  Internal MEDICINE  Office Visit Note  Patient Name: Stephanie Larson  [id number]  [id number]  Date of Service: 11/08/2022  Chief Complaint  Patient presents with   Follow-up   Depression   Medication Refill    HPI Pt is here for routine follow up and med refills -Chiropractor got her an MRI and had a bulging disc. Did traction and this helped. A little pain still but much better -BP did not improve on recheck, still has some pain though. Will start monitoring at home -last checked at home and was 139/81, will monitor more regularly and call if continued elevation   Current Medication: Outpatient Encounter Medications as of 10/26/2022  Medication Sig   atorvastatin (LIPITOR) 10 MG tablet Take 1 tablet (10 mg total) by mouth daily.   chlorhexidine (PERIDEX) 0.12 % solution Use as directed 10 mLs in the mouth or throat 2 (two) times daily.   fexofenadine (ALLEGRA) 180 MG tablet Take 1 tablet (180 mg total) by mouth daily.   levothyroxine (SYNTHROID) 175 MCG tablet Take 1 tablet (175 mcg total) by mouth daily.   mometasone (ELOCON) 0.1 % cream APPLY TO AFFECTED AREAS FROM FACE TO LOWER EXTREMITIES TWICE A DAY TO SPOT TREAT DURING FLARES   [DISCONTINUED] gabapentin (NEURONTIN) 300 MG capsule Take 1 capsule (300 mg total) by mouth 2 (two) times daily.   [DISCONTINUED] hydrOXYzine (ATARAX) 50 MG tablet TAKE 1 TABLET BY MOUTH 3 TIMES A DAY AS NEEDED   [DISCONTINUED] montelukast (SINGULAIR) 10 MG tablet Take 1 tablet (10 mg total) by mouth at bedtime.   [DISCONTINUED] rOPINIRole (REQUIP) 1 MG tablet Take 1 tablet (1 mg total) by mouth at bedtime.   [DISCONTINUED] tiZANidine (ZANAFLEX) 4 MG tablet TAKE 1/2 TO 1 TABLET BY MOUTH 2 TIMES DAILY AS NEEDED FOR MUSCLE SPASMS   [DISCONTINUED] zolpidem (AMBIEN CR) 12.5 MG CR tablet Take 1 tablet (12.5 mg total) by mouth at bedtime as needed. for sleep   gabapentin (NEURONTIN) 300 MG capsule  Take 1 capsule (300 mg total) by mouth 2 (two) times daily.   hydrOXYzine (ATARAX) 50 MG tablet Take 1 tablet (50 mg total) by mouth 3 (three) times daily as needed.   montelukast (SINGULAIR) 10 MG tablet Take 1 tablet (10 mg total) by mouth at bedtime.   rOPINIRole (REQUIP) 1 MG tablet Take 1 tablet (1 mg total) by mouth at bedtime.   tiZANidine (ZANAFLEX) 4 MG tablet TAKE 1/2 TO 1 TABLET BY MOUTH 2 TIMES DAILY AS NEEDED FOR MUSCLE SPASMS   [START ON 11/24/2022] zolpidem (AMBIEN CR) 12.5 MG CR tablet Take 1 tablet (12.5 mg total) by mouth at bedtime as needed. for sleep   No facility-administered encounter medications on file as of 10/26/2022.    Surgical History: Past Surgical History:  Procedure Laterality Date   CYSTOSCOPY N/A 05/13/2019   Procedure: CYSTOSCOPY;  Surgeon: Conard Novak, MD;  Location: [facility];  Service: Gynecology;  Laterality: N/A;   LAPAROSCOPIC GASTRIC BANDING     TOTAL LAPAROSCOPIC HYSTERECTOMY WITH SALPINGECTOMY N/A 05/13/2019   Procedure: TOTAL LAPAROSCOPIC HYSTERECTOMY WITH BILATERAL SALPINGECTOMY;  Surgeon: Conard Novak, MD;  Location: [facility];  Service: Gynecology;  Laterality: N/A;   TUBAL LIGATION      Medical History: Past Medical History:  Diagnosis Date   Anxiety    Depression    Family history of ovarian cancer    qualifies for cancer genetic testing   Hypothyroidism  Insomnia    Memory changes    Sinus drainage    Sleep apnea    Thyroid disease     Family History: Family History  Problem Relation Age of Onset   Colon cancer Paternal Grandfather    Ovarian cancer Paternal Grandmother 59   Hypertension Maternal Grandmother    Hypertension Father    Emphysema Father    ALS Mother    Hypertension Mother     Social History   Socioeconomic History   Marital status: Married    Spouse name: Not on file   Number of children: Not on file   Years of education: Not on file   Highest education level: Not on file  Occupational  History   Not on file  Tobacco Use   Smoking status: Former    Packs/day: .5    Types: Cigarettes   Smokeless tobacco: Never   Tobacco comments:    Pt quit 1 month ago 10/22  Vaping Use   Vaping Use: Never used  Substance and Sexual Activity   Alcohol use: Not Currently    Comment: occasionally   Drug use: Never   Sexual activity: Yes    Birth control/protection: None  Other Topics Concern   Not on file  Social History Narrative   Not on file   Social Determinants of Health   Financial Resource Strain: Not on file  Food Insecurity: Not on file  Transportation Needs: Not on file  Physical Activity: Not on file  Stress: Not on file  Social Connections: Not on file  Intimate Partner Violence: Not on file      Review of Systems  Constitutional:  Negative for chills, fatigue and unexpected weight change.  HENT:  Negative for congestion, rhinorrhea, sneezing and sore throat.   Eyes:  Negative for redness.  Respiratory:  Negative for cough, chest tightness and shortness of breath.   Cardiovascular:  Negative for chest pain and palpitations.  Gastrointestinal:  Negative for abdominal pain, constipation, diarrhea, nausea and vomiting.  Genitourinary:  Negative for dysuria and frequency.  Musculoskeletal:  Positive for back pain. Negative for arthralgias, joint swelling and neck pain.  Skin:  Negative for rash.  Neurological: Negative.  Negative for tremors and numbness.  Hematological:  Negative for adenopathy. Does not bruise/bleed easily.  Psychiatric/Behavioral:  Negative for behavioral problems (Depression), sleep disturbance and suicidal ideas. The patient is not nervous/anxious.     Vital Signs: BP 139/81 Comment: 125/90  Pulse 74   Temp 98.1 F (36.7 C)   Resp 16   Ht 5\' 6"  (1.676 m)   Wt 220 lb 9.6 oz (100.1 kg)   LMP 04/18/2019 (Exact Date)   SpO2 97%   BMI 35.61 kg/m    Physical Exam Vitals and nursing note reviewed.  Constitutional:      General:  She is not in acute distress.    Appearance: Normal appearance. She is obese. She is not ill-appearing.  HENT:     Head: Normocephalic and atraumatic.  Eyes:     Extraocular Movements: Extraocular movements intact.     Pupils: Pupils are equal, round, and reactive to light.  Cardiovascular:     Rate and Rhythm: Normal rate and regular rhythm.  Pulmonary:     Effort: Pulmonary effort is normal. No respiratory distress.  Chest:     Chest wall: No tenderness.  Breasts:    Right: Normal. No mass.     Left: Normal. No mass.  Abdominal:  General: Abdomen is flat.     Tenderness: There is no abdominal tenderness.  Musculoskeletal:        General: Normal range of motion.  Skin:    General: Skin is warm and dry.  Neurological:     Mental Status: She is alert and oriented to person, place, and time.     Cranial Nerves: No cranial nerve deficit.     Coordination: Coordination normal.     Gait: Gait normal.  Psychiatric:        Mood and Affect: Mood normal.        Behavior: Behavior normal.        Assessment/Plan: 1. Chronic midline low back pain without sciatica Continue current medications and follow up with chiropractor/ortho - gabapentin (NEURONTIN) 300 MG capsule; Take 1 capsule (300 mg total) by mouth 2 (two) times daily.  Dispense: 60 capsule; Refill: 2 - tiZANidine (ZANAFLEX) 4 MG tablet; TAKE 1/2 TO 1 TABLET BY MOUTH 2 TIMES DAILY AS NEEDED FOR MUSCLE SPASMS  Dispense: 60 tablet; Refill: 2  2. Acquired hypothyroidism Continue synthroid as before  3. Restless leg syndrome - gabapentin (NEURONTIN) 300 MG capsule; Take 1 capsule (300 mg total) by mouth 2 (two) times daily.  Dispense: 60 capsule; Refill: 2 - rOPINIRole (REQUIP) 1 MG tablet; Take 1 tablet (1 mg total) by mouth at bedtime.  Dispense: 30 tablet; Refill: 2  4. GAD (generalized anxiety disorder) - hydrOXYzine (ATARAX) 50 MG tablet; Take 1 tablet (50 mg total) by mouth 3 (three) times daily as needed.   Dispense: 90 tablet; Refill: 2  5. Seasonal allergic rhinitis due to pollen - montelukast (SINGULAIR) 10 MG tablet; Take 1 tablet (10 mg total) by mouth at bedtime.  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 for sleep  Dispense: 30 tablet; Refill: 1   General Counseling: Jametta verbalizes understanding of the findings of todays visit and agrees with plan of treatment. I have discussed any further diagnostic evaluation that may be needed or ordered today. We also reviewed her medications today. she has been encouraged to call the office with any questions or concerns that should arise related to todays visit.    No orders of the defined types were placed in this encounter.   Meds ordered this encounter  Medications   gabapentin (NEURONTIN) 300 MG capsule    Sig: Take 1 capsule (300 mg total) by mouth 2 (two) times daily.    Dispense:  60 capsule    Refill:  2   hydrOXYzine (ATARAX) 50 MG tablet    Sig: Take 1 tablet (50 mg total) by mouth 3 (three) times daily as needed.    Dispense:  90 tablet    Refill:  2   montelukast (SINGULAIR) 10 MG tablet    Sig: Take 1 tablet (10 mg total) by mouth at bedtime.    Dispense:  90 tablet    Refill:  1   rOPINIRole (REQUIP) 1 MG tablet    Sig: Take 1 tablet (1 mg total) by mouth at bedtime.    Dispense:  30 tablet    Refill:  2   tiZANidine (ZANAFLEX) 4 MG tablet    Sig: TAKE 1/2 TO 1 TABLET BY MOUTH 2 TIMES DAILY AS NEEDED FOR MUSCLE SPASMS    Dispense:  60 tablet    Refill:  2  zolpidem (AMBIEN CR) 12.5 MG CR tablet    Sig: Take 1 tablet (12.5 mg total) by mouth at bedtime as needed. for sleep    Dispense:  30 tablet    Refill:  1    This patient was seen by Lynn Ito, PA-C in collaboration with Dr. Beverely Risen as a part of collaborative care agreement.   Total time spent:30 Minutes Time spent  includes review of chart, medications, test results, and follow up plan with the patient.      Dr Lyndon Code Internal medicine

## 2023-01-15 ENCOUNTER — Encounter: Payer: Self-pay | Admitting: Physician Assistant

## 2023-01-15 ENCOUNTER — Ambulatory Visit: Payer: Self-pay | Admitting: Physician Assistant

## 2023-01-15 VITALS — BP 150/100 | HR 83 | Temp 97.7°F | Resp 16 | Ht 66.0 in | Wt 218.6 lb

## 2023-01-15 DIAGNOSIS — I10 Essential (primary) hypertension: Secondary | ICD-10-CM

## 2023-01-15 DIAGNOSIS — G8929 Other chronic pain: Secondary | ICD-10-CM

## 2023-01-15 DIAGNOSIS — G2581 Restless legs syndrome: Secondary | ICD-10-CM

## 2023-01-15 DIAGNOSIS — F411 Generalized anxiety disorder: Secondary | ICD-10-CM

## 2023-01-15 DIAGNOSIS — M545 Low back pain, unspecified: Secondary | ICD-10-CM

## 2023-01-15 DIAGNOSIS — F5105 Insomnia due to other mental disorder: Secondary | ICD-10-CM

## 2023-01-15 DIAGNOSIS — F409 Phobic anxiety disorder, unspecified: Secondary | ICD-10-CM

## 2023-01-15 MED ORDER — TIZANIDINE HCL 4 MG PO TABS: ORAL_TABLET | ORAL | 2 refills | Status: DC

## 2023-01-15 MED ORDER — AMLODIPINE BESYLATE 2.5 MG PO TABS
2.5000 mg | ORAL_TABLET | Freq: Every day | ORAL | 2 refills | Status: DC
Start: 2023-01-15 — End: 2023-04-19

## 2023-01-15 MED ORDER — ZOLPIDEM TARTRATE ER 12.5 MG PO TBCR: 12.5000 mg | EXTENDED_RELEASE_TABLET | Freq: Every evening | ORAL | 2 refills | Status: DC | PRN

## 2023-01-15 MED ORDER — ROPINIROLE HCL 1 MG PO TABS: 1.0000 mg | ORAL_TABLET | Freq: Every day | ORAL | 2 refills | Status: DC

## 2023-01-15 MED ORDER — HYDROXYZINE HCL 50 MG PO TABS: 50.0000 mg | ORAL_TABLET | Freq: Three times a day (TID) | ORAL | 2 refills | Status: DC | PRN

## 2023-01-15 MED ORDER — GABAPENTIN 300 MG PO CAPS: 300.0000 mg | ORAL_CAPSULE | Freq: Two times a day (BID) | ORAL | 2 refills | Status: DC

## 2023-01-15 NOTE — Progress Notes (Signed)
[facility] [address] [place], [place] [address]  Internal MEDICINE  Office Visit Note  Patient Name: Stephanie Larson  [id number]  [id number]  Date of Service: 01/15/2023  Chief Complaint  Patient presents with   Follow-up   Depression    HPI Pt is here for routine follow up -back pain is better, but BP still elevated -BP at home fluctuates still and has been running high. Some 140-160 systolic and elevated in office today. Did not improve much on recheck--148/100 -Will start on low dose BP med due to elevation at home and in office today -Not interested in colonoscopy or cologuard at this time -declines mammogram -declines shingles vaccine  Current Medication: Outpatient Encounter Medications as of 01/15/2023  Medication Sig   amLODipine (NORVASC) 2.5 MG tablet Take 1 tablet (2.5 mg total) by mouth daily.   atorvastatin (LIPITOR) 10 MG tablet Take 1 tablet (10 mg total) by mouth daily.   chlorhexidine (PERIDEX) 0.12 % solution Use as directed 10 mLs in the mouth or throat 2 (two) times daily.   fexofenadine (ALLEGRA) 180 MG tablet Take 1 tablet (180 mg total) by mouth daily.   levothyroxine (SYNTHROID) 175 MCG tablet Take 1 tablet (175 mcg total) by mouth daily.   mometasone (ELOCON) 0.1 % cream APPLY TO AFFECTED AREAS FROM FACE TO LOWER EXTREMITIES TWICE A DAY TO SPOT TREAT DURING FLARES   montelukast (SINGULAIR) 10 MG tablet Take 1 tablet (10 mg total) by mouth at bedtime.   [DISCONTINUED] gabapentin (NEURONTIN) 300 MG capsule Take 1 capsule (300 mg total) by mouth 2 (two) times daily.   [DISCONTINUED] hydrOXYzine (ATARAX) 50 MG tablet Take 1 tablet (50 mg total) by mouth 3 (three) times daily as needed.   [DISCONTINUED] rOPINIRole (REQUIP) 1 MG tablet Take 1 tablet (1 mg total) by mouth at bedtime.   [DISCONTINUED] tiZANidine (ZANAFLEX) 4 MG tablet TAKE 1/2 TO 1 TABLET BY MOUTH 2 TIMES DAILY AS NEEDED FOR MUSCLE SPASMS   [DISCONTINUED] zolpidem (AMBIEN CR) 12.5 MG  CR tablet Take 1 tablet (12.5 mg total) by mouth at bedtime as needed. for sleep   gabapentin (NEURONTIN) 300 MG capsule Take 1 capsule (300 mg total) by mouth 2 (two) times daily.   hydrOXYzine (ATARAX) 50 MG tablet Take 1 tablet (50 mg total) by mouth 3 (three) times daily as needed.   rOPINIRole (REQUIP) 1 MG tablet Take 1 tablet (1 mg total) by mouth at bedtime.   tiZANidine (ZANAFLEX) 4 MG tablet TAKE 1/2 TO 1 TABLET BY MOUTH 2 TIMES DAILY AS NEEDED FOR MUSCLE SPASMS   [START ON 01/20/2023] zolpidem (AMBIEN CR) 12.5 MG CR tablet Take 1 tablet (12.5 mg total) by mouth at bedtime as needed. for sleep   No facility-administered encounter medications on file as of 01/15/2023.    Surgical History: Past Surgical History:  Procedure Laterality Date   CYSTOSCOPY N/A 05/13/2019   Procedure: CYSTOSCOPY;  Surgeon: Conard Novak, MD;  Location: [facility];  Service: Gynecology;  Laterality: N/A;   LAPAROSCOPIC GASTRIC BANDING     TOTAL LAPAROSCOPIC HYSTERECTOMY WITH SALPINGECTOMY N/A 05/13/2019   Procedure: TOTAL LAPAROSCOPIC HYSTERECTOMY WITH BILATERAL SALPINGECTOMY;  Surgeon: Conard Novak, MD;  Location: [facility];  Service: Gynecology;  Laterality: N/A;   TUBAL LIGATION      Medical History: Past Medical History:  Diagnosis Date   Anxiety    Depression    Family history of ovarian cancer    qualifies for cancer genetic testing  Hypothyroidism    Insomnia    Memory changes    Sinus drainage    Sleep apnea    Thyroid disease     Family History: Family History  Problem Relation Age of Onset   Colon cancer Paternal Grandfather    Ovarian cancer Paternal Grandmother 68   Hypertension Maternal Grandmother    Hypertension Father    Emphysema Father    ALS Mother    Hypertension Mother     Social History   Socioeconomic History   Marital status: Married    Spouse name: Not on file   Number of children: Not on file   Years of education: Not on file   Highest education  level: Not on file  Occupational History   Not on file  Tobacco Use   Smoking status: Former    Current packs/day: 0.50    Types: Cigarettes   Smokeless tobacco: Never   Tobacco comments:    Pt quit 1 month ago 10/22  Vaping Use   Vaping status: Never Used  Substance and Sexual Activity   Alcohol use: Not Currently    Comment: occasionally   Drug use: Never   Sexual activity: Yes    Birth control/protection: None  Other Topics Concern   Not on file  Social History Narrative   Not on file   Social Determinants of Health   Financial Resource Strain: Not on file  Food Insecurity: Not on file  Transportation Needs: Not on file  Physical Activity: Not on file  Stress: Not on file  Social Connections: Not on file  Intimate Partner Violence: Not on file      Review of Systems  Constitutional:  Negative for chills, fatigue and unexpected weight change.  HENT:  Negative for congestion, rhinorrhea, sneezing and sore throat.   Eyes:  Negative for redness.  Respiratory:  Negative for cough, chest tightness and shortness of breath.   Cardiovascular:  Negative for chest pain and palpitations.  Gastrointestinal:  Negative for abdominal pain, constipation, diarrhea, nausea and vomiting.  Genitourinary:  Negative for dysuria and frequency.  Musculoskeletal:  Negative for arthralgias, joint swelling and neck pain.  Skin:  Negative for rash.  Neurological: Negative.  Negative for tremors and numbness.  Hematological:  Negative for adenopathy. Does not bruise/bleed easily.  Psychiatric/Behavioral:  Negative for behavioral problems (Depression), sleep disturbance and suicidal ideas. The patient is nervous/anxious.     Vital Signs: BP (!) 150/100   Pulse 83   Temp 97.7 F (36.5 C)   Resp 16   Ht 5\' 6"  (1.676 m)   Wt 218 lb 9.6 oz (99.2 kg)   LMP 04/18/2019 (Exact Date)   SpO2 96%   BMI 35.28 kg/m    Physical Exam Vitals and nursing note reviewed.  Constitutional:       General: She is not in acute distress.    Appearance: Normal appearance. She is obese. She is not ill-appearing.  HENT:     Head: Normocephalic and atraumatic.  Eyes:     Extraocular Movements: Extraocular movements intact.     Pupils: Pupils are equal, round, and reactive to light.  Cardiovascular:     Rate and Rhythm: Normal rate and regular rhythm.  Pulmonary:     Effort: Pulmonary effort is normal. No respiratory distress.  Chest:     Chest wall: No tenderness.  Breasts:    Right: Normal. No mass.     Left: Normal. No mass.  Abdominal:  General: Abdomen is flat.     Tenderness: There is no abdominal tenderness.  Musculoskeletal:        General: Normal range of motion.  Skin:    General: Skin is warm and dry.  Neurological:     Mental Status: She is alert and oriented to person, place, and time.     Cranial Nerves: No cranial nerve deficit.     Coordination: Coordination normal.     Gait: Gait normal.  Psychiatric:        Mood and Affect: Mood normal.        Behavior: Behavior normal.        Assessment/Plan: 1. Essential hypertension Will start on low dose amlodipine and monitor - amLODipine (NORVASC) 2.5 MG tablet; Take 1 tablet (2.5 mg total) by mouth daily.  Dispense: 30 tablet; Refill: 2  2. GAD (generalized anxiety disorder) - hydrOXYzine (ATARAX) 50 MG tablet; Take 1 tablet (50 mg total) by mouth 3 (three) times daily as needed.  Dispense: 90 tablet; Refill: 2  3. Restless leg syndrome - gabapentin (NEURONTIN) 300 MG capsule; Take 1 capsule (300 mg total) by mouth 2 (two) times daily.  Dispense: 60 capsule; Refill: 2 - rOPINIRole (REQUIP) 1 MG tablet; Take 1 tablet (1 mg total) by mouth at bedtime.  Dispense: 30 tablet; Refill: 2  4. Chronic midline low back pain without sciatica - gabapentin (NEURONTIN) 300 MG capsule; Take 1 capsule (300 mg total) by mouth 2 (two) times daily.  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 for sleep  Dispense: 30 tablet; Refill: 2   General Counseling: Shanin verbalizes understanding of the findings of todays visit and agrees with plan of treatment. I have discussed any further diagnostic evaluation that may be needed or ordered today. We also reviewed her medications today. she has been encouraged to call the office with any questions or concerns that should arise related to todays visit.    No orders of the defined types were placed in this encounter.   Meds ordered this encounter  Medications   gabapentin (NEURONTIN) 300 MG capsule    Sig: Take 1 capsule (300 mg total) by mouth 2 (two) times daily.    Dispense:  60 capsule    Refill:  2   rOPINIRole (REQUIP) 1 MG tablet    Sig: Take 1 tablet (1 mg total) by mouth at bedtime.    Dispense:  30 tablet    Refill:  2   tiZANidine (ZANAFLEX) 4 MG tablet    Sig: TAKE 1/2 TO 1 TABLET BY MOUTH 2 TIMES DAILY AS NEEDED FOR MUSCLE SPASMS    Dispense:  60 tablet    Refill:  2   zolpidem (AMBIEN CR) 12.5 MG CR tablet    Sig: Take 1 tablet (12.5 mg total) by mouth at bedtime as needed. for sleep    Dispense:  30 tablet    Refill:  2   hydrOXYzine (ATARAX) 50 MG tablet    Sig: Take 1 tablet (50 mg total) by mouth 3 (three) times daily as needed.    Dispense:  90 tablet    Refill:  2   amLODipine (NORVASC) 2.5 MG tablet  Sig: Take 1 tablet (2.5 mg total) by mouth daily.    Dispense:  30 tablet    Refill:  2    This patient was seen by Lynn Ito, PA-C in collaboration with Dr. Beverely Risen as a part of collaborative care agreement.   Total time spent:30 Minutes Time spent includes review of chart, medications, test results, and follow up plan with the patient.      Dr Lyndon Code Internal medicine

## 2023-01-25 ENCOUNTER — Ambulatory Visit: Payer: Self-pay | Admitting: Physician Assistant

## 2023-02-07 ENCOUNTER — Ambulatory Visit
Admission: RE | Admit: 2023-02-07 | Discharge: 2023-02-07 | Disposition: A | Discharge status: Home / Self Care | Payer: Self-pay | Source: Ambulatory Visit | Attending: Physician Assistant | Admitting: Physician Assistant

## 2023-02-07 VITALS — BP 158/94 | HR 74 | Temp 98.1°F | Resp 16

## 2023-02-07 DIAGNOSIS — J0140 Acute pansinusitis, unspecified: Secondary | ICD-10-CM

## 2023-02-07 MED ORDER — FLUCONAZOLE 150 MG PO TABS: 150.0000 mg | ORAL_TABLET | ORAL | 0 refills | Status: AC

## 2023-02-07 MED ORDER — AMOXICILLIN-POT CLAVULANATE 875-125 MG PO TABS: 1.0000 | ORAL_TABLET | Freq: Two times a day (BID) | ORAL | 0 refills | Status: DC

## 2023-02-07 NOTE — Discharge Instructions (Addendum)
We are treating you for sinus infection.  Start Augmentin twice daily for 7 days.  I sent in 2 doses of Diflucan in case you need it.  Continue over-the-counter medications including Mucinex, Flonase, Tylenol, nasal saline/sinus rinses.  Make sure that you rest and drink plenty of fluid.  If your symptoms are not improving within a week please return for reevaluation.  If anything worsens and you have high fever, worsening cough, shortness of breath, weakness you need to be seen immediately.

## 2023-02-07 NOTE — ED Triage Notes (Signed)
Pt c/o cough, sinus pressure, nasal congestion, and headache x 3 days. Pt has tried OTC cough medication.

## 2023-02-07 NOTE — ED Provider Notes (Signed)
[facility]    CSN: [id number] Arrival date & time: 02/07/23  1339      History   Chief Complaint Chief Complaint  Patient presents with   Nasal Congestion    Sinus congestion some cough - Entered by patient   sinus pressure    Headache   Cough    HPI Stephanie Larson is a 52 y.o. female.   Patient presents today with a several day history of URI symptoms that have significantly worsened in the several days.  Reports cough, sinus pressure, nasal congestion, headache.  Denies any fever, chest pain, shortness of breath, nausea, vomiting.  She has not had COVID or the vaccines.  She has no specific concern for COVID and declines testing today.  She denies any known sick contacts.  She does have a history of recurrent sinus infections and states current symptoms are similar to previous episodes of this condition.  She does have allergies and believes that this triggered her symptoms; reports compliance with antihistamine, Singulair, fluticasone nasal spray.  She denies any recent antibiotics or steroids.  She has no concern for pregnancy as she is status post hysterectomy.  Denies any history of chronic lung condition including asthma, COPD, smoking.    Past Medical History:  Diagnosis Date   Anxiety    Depression    Family history of ovarian cancer    qualifies for cancer genetic testing   Hypothyroidism    Insomnia    Memory changes    Sinus drainage    Sleep apnea    Thyroid disease     Patient Active Problem List   Diagnosis Date Noted   Dental infection 09/14/2019   Seasonal allergic rhinitis due to pollen 09/14/2019   Lower abdominal pain 05/13/2019   Fibroid uterus 05/13/2019   Right thyroid nodule 04/08/2019   Abscess of buttock, left 03/11/2019   Vaginal candidiasis 03/11/2019   Thyroiditis 03/11/2019   Menorrhagia with regular cycle 02/03/2019   Chronic midline low back pain without sciatica 10/09/2018   Screening for breast cancer 10/09/2018    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   Neuralgia and neuritis, unspecified 10/07/2018   Restless leg syndrome 10/07/2018   GAD (generalized anxiety disorder) 10/07/2018   Acute non-recurrent pansinusitis 04/12/2018   Nausea 04/12/2018   Acute pain of right shoulder 04/12/2018   Dysuria 04/12/2018   Hypothyroidism 04/12/2018   Vitamin D deficiency 04/12/2018   Cervical radiculopathy 01/30/2017   Increased band cell count 03/29/2016   Myalgia 03/13/2016   Leukocytosis 03/06/2016   Thrombocytosis 03/06/2016    Past Surgical History:  Procedure Laterality Date   CYSTOSCOPY N/A 05/13/2019   Procedure: CYSTOSCOPY;  Surgeon: Conard Novak, MD;  Location: [facility];  Service: Gynecology;  Laterality: N/A;   LAPAROSCOPIC GASTRIC BANDING     TOTAL LAPAROSCOPIC HYSTERECTOMY WITH SALPINGECTOMY N/A 05/13/2019   Procedure: TOTAL LAPAROSCOPIC HYSTERECTOMY WITH BILATERAL SALPINGECTOMY;  Surgeon: Conard Novak, MD;  Location: [facility];  Service: Gynecology;  Laterality: N/A;   TUBAL LIGATION      OB History     Gravida  3   Para  3   Term      Preterm      AB      Living  3      SAB      IAB      Ectopic      Multiple      Live Births  Home Medications    Prior to Admission medications   Medication Sig Start Date End Date Taking? Authorizing Provider  amoxicillin-clavulanate (AUGMENTIN) 875-125 MG tablet Take 1 tablet by mouth every 12 (twelve) hours. 02/07/23  Yes Anella Nakata K, PA-C  amLODipine (NORVASC) 2.5 MG tablet Take 1 tablet (2.5 mg total) by mouth daily. 01/15/23   McDonough, Salomon Fick, PA-C  atorvastatin (LIPITOR) 10 MG tablet Take 1 tablet (10 mg total) by mouth daily. 08/03/22   McDonough, Salomon Fick, PA-C  chlorhexidine (PERIDEX) 0.12 % solution Use as directed 10 mLs in the mouth or throat 2 (two) times daily. 12/30/20   Sallyanne Kuster, NP  fexofenadine (ALLEGRA) 180 MG tablet Take 1 tablet (180 mg total) by mouth daily. 08/08/21    McDonough, Salomon Fick, PA-C  gabapentin (NEURONTIN) 300 MG capsule Take 1 capsule (300 mg total) by mouth 2 (two) times daily. 01/15/23   McDonough, Salomon Fick, PA-C  hydrOXYzine (ATARAX) 50 MG tablet Take 1 tablet (50 mg total) by mouth 3 (three) times daily as needed. 01/15/23   McDonough, Salomon Fick, PA-C  levothyroxine (SYNTHROID) 175 MCG tablet Take 1 tablet (175 mcg total) by mouth daily. 08/03/22   McDonough, Lauren K, PA-C  mometasone (ELOCON) 0.1 % cream APPLY TO AFFECTED AREAS FROM FACE TO LOWER EXTREMITIES TWICE A DAY TO SPOT TREAT DURING FLARES 11/17/21   McDonough, Lauren K, PA-C  montelukast (SINGULAIR) 10 MG tablet Take 1 tablet (10 mg total) by mouth at bedtime. 10/26/22   McDonough, Salomon Fick, PA-C  rOPINIRole (REQUIP) 1 MG tablet Take 1 tablet (1 mg total) by mouth at bedtime. 01/15/23   McDonough, Lauren K, PA-C  tiZANidine (ZANAFLEX) 4 MG tablet TAKE 1/2 TO 1 TABLET BY MOUTH 2 TIMES DAILY AS NEEDED FOR MUSCLE SPASMS 01/15/23   McDonough, Lauren K, PA-C  zolpidem (AMBIEN CR) 12.5 MG CR tablet Take 1 tablet (12.5 mg total) by mouth at bedtime as needed. for sleep 01/20/23   Carlean Jews, PA-C    Family History Family History  Problem Relation Age of Onset   Colon cancer Paternal Grandfather    Ovarian cancer Paternal Grandmother 74   Hypertension Maternal Grandmother    Hypertension Father    Emphysema Father    ALS Mother    Hypertension Mother     Social History Social History   Tobacco Use   Smoking status: Former    Current packs/day: 0.50    Types: Cigarettes   Smokeless tobacco: Never   Tobacco comments:    Pt quit 1 month ago 10/22  Vaping Use   Vaping status: Never Used  Substance Use Topics   Alcohol use: Not Currently    Comment: occasionally   Drug use: Never     Allergies   Seroquel [quetiapine fumarate]   Review of Systems Review of Systems  Constitutional:  Positive for activity change. Negative for appetite change, fatigue and fever.  HENT:   Positive for congestion, postnasal drip, sinus pressure and sore throat. Negative for sneezing.   Respiratory:  Positive for cough. Negative for shortness of breath.   Cardiovascular:  Negative for chest pain.  Gastrointestinal:  Negative for abdominal pain, diarrhea, nausea and vomiting.  Neurological:  Positive for headaches.     Physical Exam Triage Vital Signs ED Triage Vitals  Encounter Vitals Group     BP 02/07/23 1527 (!) 158/94     Systolic BP Percentile --      Diastolic BP Percentile --  Pulse Rate 02/07/23 1524 74     Resp 02/07/23 1524 16     Temp 02/07/23 1524 98.1 F (36.7 C)     Temp Source 02/07/23 1524 Oral     SpO2 02/07/23 1524 98 %     Weight --      Height --      Head Circumference --      Peak Flow --      Pain Score 02/07/23 1523 0     Pain Loc --      Pain Education --      Exclude from Growth Chart --    No data found.  Updated Vital Signs BP (!) 158/94 (BP Location: Right Arm)   Pulse 74   Temp 98.1 F (36.7 C) (Oral)   Resp 16   LMP 04/18/2019 (Exact Date)   SpO2 98%   Visual Acuity Right Eye Distance:   Left Eye Distance:   Bilateral Distance:    Right Eye Near:   Left Eye Near:    Bilateral Near:     Physical Exam Vitals reviewed.  Constitutional:      General: She is awake. She is not in acute distress.    Appearance: Normal appearance. She is well-developed. She is not ill-appearing.     Comments: Very pleasant female appears stated age in no acute distress sitting comfortably in exam room  HENT:     Head: Normocephalic and atraumatic.     Right Ear: Tympanic membrane, ear canal and external ear normal. Tympanic membrane is not erythematous or bulging.     Left Ear: Tympanic membrane, ear canal and external ear normal. Tympanic membrane is not erythematous or bulging.     Nose:     Right Sinus: Maxillary sinus tenderness present. No frontal sinus tenderness.     Left Sinus: Maxillary sinus tenderness present. No frontal  sinus tenderness.     Mouth/Throat:     Pharynx: Uvula midline. Postnasal drip present. No oropharyngeal exudate or posterior oropharyngeal erythema.  Cardiovascular:     Rate and Rhythm: Normal rate and regular rhythm.     Heart sounds: Normal heart sounds, S1 normal and S2 normal. No murmur heard. Pulmonary:     Effort: Pulmonary effort is normal.     Breath sounds: Normal breath sounds. No wheezing, rhonchi or rales.     Comments: Clear to auscultation bilaterally Psychiatric:        Behavior: Behavior is cooperative.      UC Treatments / Results  Labs (all labs ordered are listed, but only abnormal results are displayed) Labs Reviewed - No data to display  EKG   Radiology No results found.  Procedures Procedures (including critical care time)  Medications Ordered in UC Medications - No data to display  Initial Impression / Assessment and Plan / UC Course  I have reviewed the triage vital signs and the nursing notes.  Pertinent labs & imaging results that were available during my care of the patient were reviewed by me and considered in my medical decision making (see chart for details).     Patient is well-appearing, afebrile, nontoxic, nontachycardic.  We did discuss potential etiology of viral testing but she declined this.  Given her recent of worsening symptoms will cover for secondary bacterial infection with Augmentin twice daily for 7 days.  She was encouraged to continue previously prescribed allergy medication.  We briefly discussed potential utility of a prednisone burst but patient declined this as she often  has side effects including increased anxiety with this medication.  Recommend that she use fluticasone nasal spray, Tylenol, Mucinex, nasal saline/sinus rinses.  She is to rest and drink plenty of fluid.  If her symptoms are not improving within a week she is to return for reevaluation.  If anything worsens she needs to be seen immediately.  Strict return  precautions given.  She declined work excuse note.  Final Clinical Impressions(s) / UC Diagnoses   Final diagnoses:  Acute non-recurrent pansinusitis     Discharge Instructions      We are treating you for sinus infection.  Start Augmentin twice daily for 7 days.  Continue over-the-counter medications including Mucinex, Flonase, Tylenol, nasal saline/sinus rinses.  Make sure that you rest and drink plenty of fluid.  If your symptoms are not improving within a week please return for reevaluation.  If anything worsens and you have high fever, worsening cough, shortness of breath, weakness you need to be seen immediately.     ED Prescriptions     Medication Sig Dispense Auth. Provider   amoxicillin-clavulanate (AUGMENTIN) 875-125 MG tablet Take 1 tablet by mouth every 12 (twelve) hours. 14 tablet Devony Mcgrady, Noberto Retort, PA-C      PDMP not reviewed this encounter.   Jeani Hawking, PA-C 02/07/23 1543

## 2023-02-22 ENCOUNTER — Ambulatory Visit: Payer: Self-pay | Admitting: Physician Assistant

## 2023-02-22 ENCOUNTER — Encounter: Payer: Self-pay | Admitting: Physician Assistant

## 2023-02-22 VITALS — BP 150/80 | HR 84 | Temp 98.2°F | Resp 16 | Ht 66.0 in | Wt 219.0 lb

## 2023-02-22 DIAGNOSIS — I10 Essential (primary) hypertension: Secondary | ICD-10-CM

## 2023-02-22 NOTE — Progress Notes (Signed)
[facility] [address] [place], [place] [address]  Internal MEDICINE  Office Visit Note  Patient Name: Stephanie Larson  [id number]  [id number]  Date of Service: 03/06/2023  Chief Complaint  Patient presents with   Follow-up   Hypertension    HPI Pt is here for routine follow up  for HTN -pharmacy ran out of amlodipine so did go without for 3 days, just picked it up today and will start back on it tonight -BP has been controlled at home since starting it, 117/76, 112/70. BP was previously high at home prior to starting medication therefore reassured medication has been working given change in home readings  -Tolerating well and feels good on it  Current Medication: Outpatient Encounter Medications as of 02/22/2023  Medication Sig   amLODipine (NORVASC) 2.5 MG tablet Take 1 tablet (2.5 mg total) by mouth daily.   amoxicillin-clavulanate (AUGMENTIN) 875-125 MG tablet Take 1 tablet by mouth every 12 (twelve) hours.   atorvastatin (LIPITOR) 10 MG tablet Take 1 tablet (10 mg total) by mouth daily.   chlorhexidine (PERIDEX) 0.12 % solution Use as directed 10 mLs in the mouth or throat 2 (two) times daily.   fexofenadine (ALLEGRA) 180 MG tablet Take 1 tablet (180 mg total) by mouth daily.   gabapentin (NEURONTIN) 300 MG capsule Take 1 capsule (300 mg total) by mouth 2 (two) times daily.   hydrOXYzine (ATARAX) 50 MG tablet Take 1 tablet (50 mg total) by mouth 3 (three) times daily as needed.   levothyroxine (SYNTHROID) 175 MCG tablet Take 1 tablet (175 mcg total) by mouth daily.   mometasone (ELOCON) 0.1 % cream APPLY TO AFFECTED AREAS FROM FACE TO LOWER EXTREMITIES TWICE A DAY TO SPOT TREAT DURING FLARES   montelukast (SINGULAIR) 10 MG tablet Take 1 tablet (10 mg total) by mouth at bedtime.   rOPINIRole (REQUIP) 1 MG tablet Take 1 tablet (1 mg total) by mouth at bedtime.   tiZANidine (ZANAFLEX) 4 MG tablet TAKE 1/2 TO 1 TABLET BY MOUTH 2 TIMES DAILY AS NEEDED FOR MUSCLE SPASMS    zolpidem (AMBIEN CR) 12.5 MG CR tablet Take 1 tablet (12.5 mg total) by mouth at bedtime as needed. for sleep   No facility-administered encounter medications on file as of 02/22/2023.    Surgical History: Past Surgical History:  Procedure Laterality Date   CYSTOSCOPY N/A 05/13/2019   Procedure: CYSTOSCOPY;  Surgeon: Conard Novak, MD;  Location: [facility];  Service: Gynecology;  Laterality: N/A;   LAPAROSCOPIC GASTRIC BANDING     TOTAL LAPAROSCOPIC HYSTERECTOMY WITH SALPINGECTOMY N/A 05/13/2019   Procedure: TOTAL LAPAROSCOPIC HYSTERECTOMY WITH BILATERAL SALPINGECTOMY;  Surgeon: Conard Novak, MD;  Location: [facility];  Service: Gynecology;  Laterality: N/A;   TUBAL LIGATION      Medical History: Past Medical History:  Diagnosis Date   Anxiety    Depression    Family history of ovarian cancer    qualifies for cancer genetic testing   Hypothyroidism    Insomnia    Memory changes    Sinus drainage    Sleep apnea    Thyroid disease     Family History: Family History  Problem Relation Age of Onset   Colon cancer Paternal Grandfather    Ovarian cancer Paternal Grandmother 71   Hypertension Maternal Grandmother    Hypertension Father    Emphysema Father    ALS Mother    Hypertension Mother     Social History   Socioeconomic History  Marital status: Married    Spouse name: Not on file   Number of children: Not on file   Years of education: Not on file   Highest education level: Not on file  Occupational History   Not on file  Tobacco Use   Smoking status: Former    Current packs/day: 0.50    Types: Cigarettes   Smokeless tobacco: Never   Tobacco comments:    Pt quit 1 month ago 10/22  Vaping Use   Vaping status: Never Used  Substance and Sexual Activity   Alcohol use: Not Currently    Comment: occasionally   Drug use: Never   Sexual activity: Yes    Birth control/protection: None  Other Topics Concern   Not on file  Social History Narrative    Not on file   Social Determinants of Health   Financial Resource Strain: Not on file  Food Insecurity: Not on file  Transportation Needs: Not on file  Physical Activity: Not on file  Stress: Not on file  Social Connections: Not on file  Intimate Partner Violence: Not on file      Review of Systems  Constitutional:  Negative for chills, fatigue and unexpected weight change.  HENT:  Negative for congestion, rhinorrhea, sneezing and sore throat.   Eyes:  Negative for redness.  Respiratory:  Negative for cough, chest tightness and shortness of breath.   Cardiovascular:  Negative for chest pain and palpitations.  Gastrointestinal:  Negative for abdominal pain, constipation, diarrhea, nausea and vomiting.  Genitourinary:  Negative for dysuria and frequency.  Musculoskeletal:  Negative for arthralgias, joint swelling and neck pain.  Skin:  Negative for rash.  Neurological: Negative.  Negative for tremors and numbness.  Hematological:  Negative for adenopathy. Does not bruise/bleed easily.  Psychiatric/Behavioral:  Negative for behavioral problems (Depression), sleep disturbance and suicidal ideas.     Vital Signs: BP (!) 150/80   Pulse 84   Temp 98.2 F (36.8 C)   Resp 16   Ht 5\' 6"  (1.676 m)   Wt 219 lb (99.3 kg)   LMP 04/18/2019 (Exact Date)   SpO2 95%   BMI 35.35 kg/m    Physical Exam Vitals and nursing note reviewed.  Constitutional:      General: She is not in acute distress.    Appearance: Normal appearance. She is obese. She is not ill-appearing.  HENT:     Head: Normocephalic and atraumatic.  Eyes:     Extraocular Movements: Extraocular movements intact.     Pupils: Pupils are equal, round, and reactive to light.  Cardiovascular:     Rate and Rhythm: Normal rate and regular rhythm.  Pulmonary:     Effort: Pulmonary effort is normal. No respiratory distress.  Chest:     Chest wall: No tenderness.  Breasts:    Right: Normal. No mass.     Left: Normal. No  mass.  Abdominal:     General: Abdomen is flat.     Tenderness: There is no abdominal tenderness.  Musculoskeletal:        General: Normal range of motion.  Skin:    General: Skin is warm and dry.  Neurological:     Mental Status: She is alert and oriented to person, place, and time.     Cranial Nerves: No cranial nerve deficit.     Coordination: Coordination normal.     Gait: Gait normal.  Psychiatric:        Mood and Affect: Mood normal.  Behavior: Behavior normal.        Assessment/Plan: 1. Essential hypertension Elevated in office due to pharmacy running out of medication and will be starting back on this today. Much improved at home and will continue to monitor   General Counseling: Melda verbalizes understanding of the findings of todays visit and agrees with plan of treatment. I have discussed any further diagnostic evaluation that may be needed or ordered today. We also reviewed her medications today. she has been encouraged to call the office with any questions or concerns that should arise related to todays visit.    No orders of the defined types were placed in this encounter.   No orders of the defined types were placed in this encounter.   This patient was seen by Lynn Ito, PA-C in collaboration with Dr. Beverely Risen as a part of collaborative care agreement.   Total time spent:30 Minutes Time spent includes review of chart, medications, test results, and follow up plan with the patient.      Dr Lyndon Code Internal medicine

## 2023-04-19 ENCOUNTER — Ambulatory Visit: Payer: Self-pay | Admitting: Physician Assistant

## 2023-04-19 ENCOUNTER — Encounter: Payer: Self-pay | Admitting: Physician Assistant

## 2023-04-19 DIAGNOSIS — G2581 Restless legs syndrome: Secondary | ICD-10-CM

## 2023-04-19 DIAGNOSIS — I10 Essential (primary) hypertension: Secondary | ICD-10-CM

## 2023-04-19 DIAGNOSIS — M545 Low back pain, unspecified: Secondary | ICD-10-CM

## 2023-04-19 DIAGNOSIS — G8929 Other chronic pain: Secondary | ICD-10-CM

## 2023-04-19 DIAGNOSIS — J301 Allergic rhinitis due to pollen: Secondary | ICD-10-CM

## 2023-04-19 DIAGNOSIS — F409 Phobic anxiety disorder, unspecified: Secondary | ICD-10-CM

## 2023-04-19 DIAGNOSIS — F411 Generalized anxiety disorder: Secondary | ICD-10-CM

## 2023-04-19 DIAGNOSIS — F5105 Insomnia due to other mental disorder: Secondary | ICD-10-CM

## 2023-04-19 MED ORDER — AMLODIPINE BESYLATE 2.5 MG PO TABS: 2.5000 mg | ORAL_TABLET | Freq: Every day | ORAL | 1 refills | Status: DC

## 2023-04-19 MED ORDER — GABAPENTIN 300 MG PO CAPS: 300.0000 mg | ORAL_CAPSULE | Freq: Two times a day (BID) | ORAL | 2 refills | Status: DC

## 2023-04-19 MED ORDER — HYDROXYZINE HCL 50 MG PO TABS: 50.0000 mg | ORAL_TABLET | Freq: Three times a day (TID) | ORAL | 2 refills | Status: DC | PRN

## 2023-04-19 MED ORDER — ROPINIROLE HCL 1 MG PO TABS: 1.0000 mg | ORAL_TABLET | Freq: Every day | ORAL | 2 refills | Status: DC

## 2023-04-19 MED ORDER — MONTELUKAST SODIUM 10 MG PO TABS: 10.0000 mg | ORAL_TABLET | Freq: Every day | ORAL | 1 refills | Status: DC

## 2023-04-19 MED ORDER — ZOLPIDEM TARTRATE ER 12.5 MG PO TBCR: 12.5000 mg | EXTENDED_RELEASE_TABLET | Freq: Every evening | ORAL | 2 refills | Status: DC | PRN

## 2023-04-19 MED ORDER — TIZANIDINE HCL 4 MG PO TABS: ORAL_TABLET | ORAL | 2 refills | Status: DC

## 2023-04-19 NOTE — Progress Notes (Signed)
[facility] [address] [place], [place] [address]  Internal MEDICINE  Office Visit Note  Patient Name: Stephanie Larson  [id number]  [id number]  Date of Service: 04/19/2023  Chief Complaint  Patient presents with   Follow-up   Hypertension   Medication Refill    HPI Pt is here for routine follow up and is doing well today -BP at home 120/76 this morning, has been doing very well with low dose amlodipine -back is doing better, sees chiropractor -sleep has been ok as long as she takes her medication, does need refills  Current Medication: Outpatient Encounter Medications as of 04/19/2023  Medication Sig   atorvastatin (LIPITOR) 10 MG tablet Take 1 tablet (10 mg total) by mouth daily.   chlorhexidine (PERIDEX) 0.12 % solution Use as directed 10 mLs in the mouth or throat 2 (two) times daily.   fexofenadine (ALLEGRA) 180 MG tablet Take 1 tablet (180 mg total) by mouth daily.   levothyroxine (SYNTHROID) 175 MCG tablet Take 1 tablet (175 mcg total) by mouth daily.   mometasone (ELOCON) 0.1 % cream APPLY TO AFFECTED AREAS FROM FACE TO LOWER EXTREMITIES TWICE A DAY TO SPOT TREAT DURING FLARES   [DISCONTINUED] amLODipine (NORVASC) 2.5 MG tablet Take 1 tablet (2.5 mg total) by mouth daily.   [DISCONTINUED] amoxicillin-clavulanate (AUGMENTIN) 875-125 MG tablet Take 1 tablet by mouth every 12 (twelve) hours.   [DISCONTINUED] gabapentin (NEURONTIN) 300 MG capsule Take 1 capsule (300 mg total) by mouth 2 (two) times daily.   [DISCONTINUED] hydrOXYzine (ATARAX) 50 MG tablet Take 1 tablet (50 mg total) by mouth 3 (three) times daily as needed.   [DISCONTINUED] montelukast (SINGULAIR) 10 MG tablet Take 1 tablet (10 mg total) by mouth at bedtime.   [DISCONTINUED] rOPINIRole (REQUIP) 1 MG tablet Take 1 tablet (1 mg total) by mouth at bedtime.   [DISCONTINUED] tiZANidine (ZANAFLEX) 4 MG tablet TAKE 1/2 TO 1 TABLET BY MOUTH 2 TIMES DAILY AS NEEDED FOR MUSCLE SPASMS   [DISCONTINUED]  zolpidem (AMBIEN CR) 12.5 MG CR tablet Take 1 tablet (12.5 mg total) by mouth at bedtime as needed. for sleep   amLODipine (NORVASC) 2.5 MG tablet Take 1 tablet (2.5 mg total) by mouth daily.   gabapentin (NEURONTIN) 300 MG capsule Take 1 capsule (300 mg total) by mouth 2 (two) times daily.   hydrOXYzine (ATARAX) 50 MG tablet Take 1 tablet (50 mg total) by mouth 3 (three) times daily as needed.   montelukast (SINGULAIR) 10 MG tablet Take 1 tablet (10 mg total) by mouth at bedtime.   rOPINIRole (REQUIP) 1 MG tablet Take 1 tablet (1 mg total) by mouth at bedtime.   tiZANidine (ZANAFLEX) 4 MG tablet TAKE 1/2 TO 1 TABLET BY MOUTH 2 TIMES DAILY AS NEEDED FOR MUSCLE SPASMS   zolpidem (AMBIEN CR) 12.5 MG CR tablet Take 1 tablet (12.5 mg total) by mouth at bedtime as needed. for sleep   No facility-administered encounter medications on file as of 04/19/2023.    Surgical History: Past Surgical History:  Procedure Laterality Date   CYSTOSCOPY N/A 05/13/2019   Procedure: CYSTOSCOPY;  Surgeon: Conard Novak, MD;  Location: [facility];  Service: Gynecology;  Laterality: N/A;   LAPAROSCOPIC GASTRIC BANDING     TOTAL LAPAROSCOPIC HYSTERECTOMY WITH SALPINGECTOMY N/A 05/13/2019   Procedure: TOTAL LAPAROSCOPIC HYSTERECTOMY WITH BILATERAL SALPINGECTOMY;  Surgeon: Conard Novak, MD;  Location: [facility];  Service: Gynecology;  Laterality: N/A;   TUBAL LIGATION      Medical History:  Past Medical History:  Diagnosis Date   Anxiety    Depression    Family history of ovarian cancer    qualifies for cancer genetic testing   Hypothyroidism    Insomnia    Memory changes    Sinus drainage    Sleep apnea    Thyroid disease     Family History: Family History  Problem Relation Age of Onset   Colon cancer Paternal Grandfather    Ovarian cancer Paternal Grandmother 64   Hypertension Maternal Grandmother    Hypertension Father    Emphysema Father    ALS Mother    Hypertension Mother     Social  History   Socioeconomic History   Marital status: Married    Spouse name: Not on file   Number of children: Not on file   Years of education: Not on file   Highest education level: Not on file  Occupational History   Not on file  Tobacco Use   Smoking status: Former    Current packs/day: 0.50    Types: Cigarettes   Smokeless tobacco: Never   Tobacco comments:    Pt quit 1 month ago 10/22  Vaping Use   Vaping status: Never Used  Substance and Sexual Activity   Alcohol use: Not Currently    Comment: occasionally   Drug use: Never   Sexual activity: Yes    Birth control/protection: None  Other Topics Concern   Not on file  Social History Narrative   Not on file   Social Drivers of Health   Financial Resource Strain: Not on file  Food Insecurity: Not on file  Transportation Needs: Not on file  Physical Activity: Not on file  Stress: Not on file  Social Connections: Not on file  Intimate Partner Violence: Not on file      Review of Systems  Constitutional:  Negative for chills, fatigue and unexpected weight change.  HENT:  Negative for congestion, rhinorrhea, sneezing and sore throat.   Eyes:  Negative for redness.  Respiratory:  Negative for cough, chest tightness and shortness of breath.   Cardiovascular:  Negative for chest pain and palpitations.  Gastrointestinal:  Negative for abdominal pain, constipation, diarrhea, nausea and vomiting.  Genitourinary:  Negative for dysuria and frequency.  Musculoskeletal:  Negative for arthralgias, joint swelling and neck pain.  Skin:  Negative for rash.  Neurological: Negative.  Negative for tremors and numbness.  Hematological:  Negative for adenopathy. Does not bruise/bleed easily.  Psychiatric/Behavioral:  Negative for behavioral problems (Depression), sleep disturbance and suicidal ideas.     Vital Signs: BP 112/80   Pulse 87   Temp 97.6 F (36.4 C)   Resp 16   Ht 5\' 6"  (1.676 m)   Wt 214 lb 9.6 oz (97.3 kg)    LMP 04/18/2019 (Exact Date)   SpO2 97%   BMI 34.64 kg/m    Physical Exam Vitals and nursing note reviewed.  Constitutional:      General: She is not in acute distress.    Appearance: Normal appearance. She is obese. She is not ill-appearing.  HENT:     Head: Normocephalic and atraumatic.  Eyes:     Extraocular Movements: Extraocular movements intact.     Pupils: Pupils are equal, round, and reactive to light.  Cardiovascular:     Rate and Rhythm: Normal rate and regular rhythm.  Pulmonary:     Effort: Pulmonary effort is normal. No respiratory distress.  Chest:     Chest  wall: No tenderness.  Breasts:    Right: Normal. No mass.     Left: Normal. No mass.  Abdominal:     General: Abdomen is flat.  Musculoskeletal:        General: Normal range of motion.  Skin:    General: Skin is warm and dry.  Neurological:     Mental Status: She is alert and oriented to person, place, and time.     Cranial Nerves: No cranial nerve deficit.     Coordination: Coordination normal.     Gait: Gait normal.  Psychiatric:        Mood and Affect: Mood normal.        Behavior: Behavior normal.        Assessment/Plan: 1. Essential hypertension Improved, continue current medication - amLODipine (NORVASC) 2.5 MG tablet; Take 1 tablet (2.5 mg total) by mouth daily.  Dispense: 90 tablet; Refill: 1  2. Restless leg syndrome - gabapentin (NEURONTIN) 300 MG capsule; Take 1 capsule (300 mg total) by mouth 2 (two) times daily.  Dispense: 60 capsule; Refill: 2 - rOPINIRole (REQUIP) 1 MG tablet; Take 1 tablet (1 mg total) by mouth at bedtime.  Dispense: 30 tablet; Refill: 2  3. Chronic midline low back pain without sciatica - gabapentin (NEURONTIN) 300 MG capsule; Take 1 capsule (300 mg total) by mouth 2 (two) times daily.  Dispense: 60 capsule; Refill: 2 - tiZANidine (ZANAFLEX) 4 MG tablet; TAKE 1/2 TO 1 TABLET BY MOUTH 2 TIMES DAILY AS NEEDED FOR MUSCLE SPASMS  Dispense: 60 tablet; Refill:  2  4. GAD (generalized anxiety disorder) - hydrOXYzine (ATARAX) 50 MG tablet; Take 1 tablet (50 mg total) by mouth 3 (three) times daily as needed.  Dispense: 90 tablet; Refill: 2  5. Seasonal allergic rhinitis due to pollen - montelukast (SINGULAIR) 10 MG tablet; Take 1 tablet (10 mg total) by mouth at bedtime.  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 for sleep  Dispense: 30 tablet; Refill: 2   General Counseling: Josanne verbalizes understanding of the findings of todays visit and agrees with plan of treatment. I have discussed any further diagnostic evaluation that may be needed or ordered today. We also reviewed her medications today. she has been encouraged to call the office with any questions or concerns that should arise related to todays visit.    No orders of the defined types were placed in this encounter.   Meds ordered this encounter  Medications   amLODipine (NORVASC) 2.5 MG tablet    Sig: Take 1 tablet (2.5 mg total) by mouth daily.    Dispense:  90 tablet    Refill:  1   gabapentin (NEURONTIN) 300 MG capsule    Sig: Take 1 capsule (300 mg total) by mouth 2 (two) times daily.    Dispense:  60 capsule    Refill:  2   hydrOXYzine (ATARAX) 50 MG tablet    Sig: Take 1 tablet (50 mg total) by mouth 3 (three) times daily as needed.    Dispense:  90 tablet    Refill:  2   montelukast (SINGULAIR) 10 MG tablet    Sig: Take 1 tablet (10 mg total) by mouth at bedtime.    Dispense:  90 tablet    Refill:  1   rOPINIRole (REQUIP) 1 MG tablet    Sig:  Take 1 tablet (1 mg total) by mouth at bedtime.    Dispense:  30 tablet    Refill:  2   tiZANidine (ZANAFLEX) 4 MG tablet    Sig: TAKE 1/2 TO 1 TABLET BY MOUTH 2 TIMES DAILY AS NEEDED FOR MUSCLE SPASMS    Dispense:  60 tablet    Refill:  2   zolpidem (AMBIEN CR) 12.5 MG CR tablet    Sig: Take 1  tablet (12.5 mg total) by mouth at bedtime as needed. for sleep    Dispense:  30 tablet    Refill:  2    This patient was seen by Lynn Ito, PA-C in collaboration with Dr. Beverely Risen as a part of collaborative care agreement.   Total time spent:30 Minutes Time spent includes review of chart, medications, test results, and follow up plan with the patient.      Dr Lyndon Code Internal medicine

## 2023-07-19 ENCOUNTER — Encounter: Payer: Self-pay | Admitting: Physician Assistant

## 2023-07-19 ENCOUNTER — Ambulatory Visit: Payer: Self-pay | Admitting: Physician Assistant

## 2023-07-19 VITALS — BP 112/70 | HR 86 | Temp 97.9°F | Resp 16 | Ht 66.0 in | Wt 204.0 lb

## 2023-07-19 DIAGNOSIS — E538 Deficiency of other specified B group vitamins: Secondary | ICD-10-CM

## 2023-07-19 DIAGNOSIS — M545 Low back pain, unspecified: Secondary | ICD-10-CM

## 2023-07-19 DIAGNOSIS — E039 Hypothyroidism, unspecified: Secondary | ICD-10-CM

## 2023-07-19 DIAGNOSIS — I10 Essential (primary) hypertension: Secondary | ICD-10-CM

## 2023-07-19 DIAGNOSIS — J301 Allergic rhinitis due to pollen: Secondary | ICD-10-CM

## 2023-07-19 DIAGNOSIS — E782 Mixed hyperlipidemia: Secondary | ICD-10-CM

## 2023-07-19 DIAGNOSIS — R5383 Other fatigue: Secondary | ICD-10-CM

## 2023-07-19 DIAGNOSIS — G8929 Other chronic pain: Secondary | ICD-10-CM

## 2023-07-19 DIAGNOSIS — E559 Vitamin D deficiency, unspecified: Secondary | ICD-10-CM

## 2023-07-19 DIAGNOSIS — G2581 Restless legs syndrome: Secondary | ICD-10-CM

## 2023-07-19 DIAGNOSIS — F409 Phobic anxiety disorder, unspecified: Secondary | ICD-10-CM

## 2023-07-19 DIAGNOSIS — F5105 Insomnia due to other mental disorder: Secondary | ICD-10-CM

## 2023-07-19 DIAGNOSIS — R7303 Prediabetes: Secondary | ICD-10-CM

## 2023-07-19 DIAGNOSIS — S99921A Unspecified injury of right foot, initial encounter: Secondary | ICD-10-CM

## 2023-07-19 MED ORDER — MONTELUKAST SODIUM 10 MG PO TABS: 10.0000 mg | ORAL_TABLET | Freq: Every day | ORAL | 1 refills | Status: DC

## 2023-07-19 MED ORDER — ZOLPIDEM TARTRATE ER 12.5 MG PO TBCR: 12.5000 mg | EXTENDED_RELEASE_TABLET | Freq: Every evening | ORAL | 0 refills | Status: DC | PRN

## 2023-07-19 MED ORDER — ATORVASTATIN CALCIUM 10 MG PO TABS: 10.0000 mg | ORAL_TABLET | Freq: Every day | ORAL | 3 refills | Status: AC

## 2023-07-19 MED ORDER — TIZANIDINE HCL 4 MG PO TABS: ORAL_TABLET | ORAL | 2 refills | Status: DC

## 2023-07-19 MED ORDER — ROPINIROLE HCL 1 MG PO TABS: 1.0000 mg | ORAL_TABLET | Freq: Every day | ORAL | 2 refills | Status: DC

## 2023-07-19 MED ORDER — LEVOTHYROXINE SODIUM 175 MCG PO TABS: 175.0000 ug | ORAL_TABLET | Freq: Every day | ORAL | 0 refills | Status: DC

## 2023-07-19 NOTE — Progress Notes (Signed)
 [facility] [address] [place], [place] [address]  Internal MEDICINE  Office Visit Note  Patient Name: Stephanie Larson  [id number]  [id number]  Date of Service: 08/01/2023  Chief Complaint  Patient presents with   Follow-up   Depression   Medication Refill    HPI Pt is here for routine follow up -Did injure right pinky toe last night, was bending down and was up on toes squatting forward and when standing saw toe sticking out to the side a little. Not tender on exam, but a little bruising -discussed checking xray vs seeing ortho -BP stable -due for med refills -will order labs for CPE  Current Medication: Outpatient Encounter Medications as of 07/19/2023  Medication Sig   amLODipine (NORVASC) 2.5 MG tablet Take 1 tablet (2.5 mg total) by mouth daily.   chlorhexidine (PERIDEX) 0.12 % solution Use as directed 10 mLs in the mouth or throat 2 (two) times daily.   fexofenadine (ALLEGRA) 180 MG tablet Take 1 tablet (180 mg total) by mouth daily.   gabapentin (NEURONTIN) 300 MG capsule Take 1 capsule (300 mg total) by mouth 2 (two) times daily.   hydrOXYzine (ATARAX) 50 MG tablet Take 1 tablet (50 mg total) by mouth 3 (three) times daily as needed.   mometasone (ELOCON) 0.1 % cream APPLY TO AFFECTED AREAS FROM FACE TO LOWER EXTREMITIES TWICE A DAY TO SPOT TREAT DURING FLARES   [DISCONTINUED] atorvastatin (LIPITOR) 10 MG tablet Take 1 tablet (10 mg total) by mouth daily.   [DISCONTINUED] levothyroxine (SYNTHROID) 175 MCG tablet Take 1 tablet (175 mcg total) by mouth daily.   [DISCONTINUED] montelukast (SINGULAIR) 10 MG tablet Take 1 tablet (10 mg total) by mouth at bedtime.   [DISCONTINUED] rOPINIRole (REQUIP) 1 MG tablet Take 1 tablet (1 mg total) by mouth at bedtime.   [DISCONTINUED] tiZANidine (ZANAFLEX) 4 MG tablet TAKE 1/2 TO 1 TABLET BY MOUTH 2 TIMES DAILY AS NEEDED FOR MUSCLE SPASMS   [DISCONTINUED] zolpidem (AMBIEN CR) 12.5 MG CR tablet Take 1 tablet (12.5 mg total) by  mouth at bedtime as needed. for sleep   atorvastatin (LIPITOR) 10 MG tablet Take 1 tablet (10 mg total) by mouth daily.   levothyroxine (SYNTHROID) 175 MCG tablet Take 1 tablet (175 mcg total) by mouth daily.   montelukast (SINGULAIR) 10 MG tablet Take 1 tablet (10 mg total) by mouth at bedtime.   rOPINIRole (REQUIP) 1 MG tablet Take 1 tablet (1 mg total) by mouth at bedtime.   tiZANidine (ZANAFLEX) 4 MG tablet TAKE 1/2 TO 1 TABLET BY MOUTH 2 TIMES DAILY AS NEEDED FOR MUSCLE SPASMS   zolpidem (AMBIEN CR) 12.5 MG CR tablet Take 1 tablet (12.5 mg total) by mouth at bedtime as needed. for sleep   No facility-administered encounter medications on file as of 07/19/2023.    Surgical History: Past Surgical History:  Procedure Laterality Date   CYSTOSCOPY N/A 05/13/2019   Procedure: CYSTOSCOPY;  Surgeon: Conard Novak, MD;  Location: [facility];  Service: Gynecology;  Laterality: N/A;   LAPAROSCOPIC GASTRIC BANDING     TOTAL LAPAROSCOPIC HYSTERECTOMY WITH SALPINGECTOMY N/A 05/13/2019   Procedure: TOTAL LAPAROSCOPIC HYSTERECTOMY WITH BILATERAL SALPINGECTOMY;  Surgeon: Conard Novak, MD;  Location: [facility];  Service: Gynecology;  Laterality: N/A;   TUBAL LIGATION      Medical History: Past Medical History:  Diagnosis Date   Anxiety    Depression    Family history of ovarian cancer    qualifies for cancer genetic  testing   Hypothyroidism    Insomnia    Memory changes    Sinus drainage    Sleep apnea    Thyroid disease     Family History: Family History  Problem Relation Age of Onset   Colon cancer Paternal Grandfather    Ovarian cancer Paternal Grandmother 52   Hypertension Maternal Grandmother    Hypertension Father    Emphysema Father    ALS Mother    Hypertension Mother     Social History   Socioeconomic History   Marital status: Married    Spouse name: Not on file   Number of children: Not on file   Years of education: Not on file   Highest education level: Not  on file  Occupational History   Not on file  Tobacco Use   Smoking status: Former    Current packs/day: 0.50    Types: Cigarettes   Smokeless tobacco: Never   Tobacco comments:    Pt quit 1 month ago 10/22  Vaping Use   Vaping status: Never Used  Substance and Sexual Activity   Alcohol use: Not Currently    Comment: occasionally   Drug use: Never   Sexual activity: Yes    Birth control/protection: None  Other Topics Concern   Not on file  Social History Narrative   Not on file   Social Drivers of Health   Financial Resource Strain: Not on file  Food Insecurity: Not on file  Transportation Needs: Not on file  Physical Activity: Not on file  Stress: Not on file  Social Connections: Not on file  Intimate Partner Violence: Not on file      Review of Systems  Constitutional:  Negative for chills, fatigue and unexpected weight change.  HENT:  Negative for congestion, rhinorrhea, sneezing and sore throat.   Eyes:  Negative for redness.  Respiratory:  Negative for cough, chest tightness and shortness of breath.   Cardiovascular:  Negative for chest pain and palpitations.  Gastrointestinal:  Negative for abdominal pain, constipation, diarrhea, nausea and vomiting.  Genitourinary:  Negative for dysuria and frequency.  Musculoskeletal:  Negative for arthralgias, joint swelling and neck pain.       Right pinky toe injury  Skin:  Negative for rash.  Neurological: Negative.  Negative for tremors and numbness.  Hematological:  Negative for adenopathy. Does not bruise/bleed easily.  Psychiatric/Behavioral:  Negative for behavioral problems (Depression), sleep disturbance and suicidal ideas.     Vital Signs: BP 112/70   Pulse 86   Temp 97.9 F (36.6 C)   Resp 16   Ht 5\' 6"  (1.676 m)   Wt 204 lb (92.5 kg)   LMP 04/18/2019 (Exact Date)   SpO2 97%   BMI 32.93 kg/m    Physical Exam Vitals and nursing note reviewed.  Constitutional:      General: She is not in acute  distress.    Appearance: Normal appearance. She is obese. She is not ill-appearing.  HENT:     Head: Normocephalic and atraumatic.  Eyes:     Pupils: Pupils are equal, round, and reactive to light.  Cardiovascular:     Rate and Rhythm: Normal rate and regular rhythm.  Pulmonary:     Effort: Pulmonary effort is normal. No respiratory distress.  Chest:     Chest wall: No tenderness.  Breasts:    Right: Normal. No mass.     Left: Normal. No mass.  Musculoskeletal:  General: Signs of injury present. Normal range of motion.     Comments: Right pinky toe with min bruising, non TTP. Able to move all toes.  Skin:    General: Skin is warm and dry.  Neurological:     Mental Status: She is alert and oriented to person, place, and time.     Cranial Nerves: No cranial nerve deficit.     Coordination: Coordination normal.     Gait: Gait normal.  Psychiatric:        Mood and Affect: Mood normal.        Behavior: Behavior normal.        Assessment/Plan: 1. 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 for sleep  Dispense: 30 tablet; Refill: 0  3. Chronic midline low back pain without sciatica - tiZANidine (ZANAFLEX) 4 MG tablet; TAKE 1/2 TO 1 TABLET BY MOUTH 2 TIMES DAILY AS NEEDED FOR MUSCLE SPASMS  Dispense: 60 tablet; Refill: 2  4. Injury of toe on right foot, initial encounter Will check xray - DG Foot Complete Right; Future  5. Restless leg syndrome - rOPINIRole (REQUIP) 1 MG tablet; Take 1 tablet (1 mg total) by mouth at bedtime.  Dispense: 30 tablet; Refill: 2  6. Seasonal allergic rhinitis due to pollen - montelukast (SINGULAIR) 10 MG tablet; Take 1 tablet (10 mg total) by mouth at bedtime.  Dispense: 90 tablet; Refill: 1  7. Acquired hypothyroidism Will continue  synthroid and will update labs - TSH + free T4 - levothyroxine (SYNTHROID) 175 MCG tablet; Take 1 tablet (175 mcg total) by mouth daily.  Dispense: 30 tablet; Refill: 0  8. Mixed hyperlipidemia Continue lipitor and will update labs - atorvastatin (LIPITOR) 10 MG tablet; Take 1 tablet (10 mg total) by mouth daily.  Dispense: 90 tablet; Refill: 3 - Lipid Panel With LDL/HDL Ratio  9. Prediabetes - Hgb A1C w/o eAG  10. B12 deficiency - B12 and Folate Panel  11. Vitamin D deficiency - VITAMIN D 25 Hydroxy (Vit-D Deficiency, Fractures)  12. Other fatigue - CBC w/Diff/Platelet - Comprehensive metabolic panel - Lipid Panel With LDL/HDL Ratio - Fe+TIBC+Fer   General Counseling: Shenna verbalizes understanding of the findings of todays visit and agrees with plan of treatment. I have discussed any further diagnostic evaluation that may be needed or ordered today. We also reviewed her medications today. she has been encouraged to call the office with any questions or concerns that should arise related to todays visit.    Orders Placed This Encounter  Procedures   DG Foot Complete Right   CBC w/Diff/Platelet   Comprehensive metabolic panel   TSH + free T4   Hgb A1C w/o eAG   Lipid Panel With LDL/HDL Ratio   Fe+TIBC+Fer   Z61 and Folate Panel   VITAMIN D 25 Hydroxy (Vit-D Deficiency, Fractures)    Meds ordered this encounter  Medications   zolpidem (AMBIEN CR) 12.5 MG CR tablet    Sig: Take 1 tablet (12.5 mg total) by mouth at bedtime as needed. for sleep    Dispense:  30 tablet    Refill:  0   tiZANidine (ZANAFLEX) 4 MG tablet    Sig: TAKE 1/2 TO 1 TABLET BY MOUTH 2 TIMES DAILY AS NEEDED FOR MUSCLE SPASMS    Dispense:  60 tablet  Refill:  2   rOPINIRole (REQUIP) 1 MG tablet    Sig: Take 1 tablet (1 mg total) by mouth at bedtime.    Dispense:  30 tablet    Refill:  2   montelukast (SINGULAIR) 10 MG tablet    Sig: Take 1 tablet (10 mg total) by mouth at bedtime.    Dispense:   90 tablet    Refill:  1   atorvastatin (LIPITOR) 10 MG tablet    Sig: Take 1 tablet (10 mg total) by mouth daily.    Dispense:  90 tablet    Refill:  3   levothyroxine (SYNTHROID) 175 MCG tablet    Sig: Take 1 tablet (175 mcg total) by mouth daily.    Dispense:  30 tablet    Refill:  0    This patient was seen by Lynn Ito, PA-C in collaboration with Dr. Beverely Risen as a part of collaborative care agreement.   Total time spent:30 Minutes Time spent includes review of chart, medications, test results, and follow up plan with the patient.      Dr Lyndon Code Internal medicine

## 2023-08-02 ENCOUNTER — Telehealth: Payer: Self-pay | Admitting: Physician Assistant

## 2023-08-02 NOTE — Telephone Encounter (Signed)
 Left vm and sent mychart message to confirm 08/09/23 appointment-Toni

## 2023-08-04 LAB — COMPREHENSIVE METABOLIC PANEL WITH GFR
ALT: 25 IU/L (ref 0–32)
AST: 31 IU/L (ref 0–40)
Albumin: 4.4 g/dL (ref 3.8–4.9)
Alkaline Phosphatase: 90 IU/L (ref 44–121)
BUN/Creatinine Ratio: 7 — ABNORMAL LOW (ref 9–23)
BUN: 8 mg/dL (ref 6–24)
Bilirubin Total: 1 mg/dL (ref 0.0–1.2)
CO2: 24 mmol/L (ref 20–29)
Calcium: 9.5 mg/dL (ref 8.7–10.2)
Chloride: 100 mmol/L (ref 96–106)
Creatinine, Ser: 1.11 mg/dL — ABNORMAL HIGH (ref 0.57–1.00)
Globulin, Total: 2.4 g/dL (ref 1.5–4.5)
Glucose: 103 mg/dL — ABNORMAL HIGH (ref 70–99)
Potassium: 2.9 mmol/L — ABNORMAL LOW (ref 3.5–5.2)
Sodium: 143 mmol/L (ref 134–144)
Total Protein: 6.8 g/dL (ref 6.0–8.5)
eGFR: 60 mL/min/{1.73_m2} (ref >59)

## 2023-08-04 LAB — CBC WITH DIFFERENTIAL/PLATELET
Basophils Absolute: 0.1 10*3/uL (ref 0.0–0.2)
Basos: 1 %
EOS (ABSOLUTE): 0.4 10*3/uL (ref 0.0–0.4)
Eos: 3 %
Hematocrit: 36 % (ref 34.0–46.6)
Hemoglobin: 12.3 g/dL (ref 11.1–15.9)
Immature Grans (Abs): 0 10*3/uL (ref 0.0–0.1)
Immature Granulocytes: 0 %
Lymphocytes Absolute: 2.9 10*3/uL (ref 0.7–3.1)
Lymphs: 26 %
MCH: 27.6 pg (ref 26.6–33.0)
MCHC: 34.2 g/dL (ref 31.5–35.7)
MCV: 81 fL (ref 79–97)
Monocytes Absolute: 0.6 10*3/uL (ref 0.1–0.9)
Monocytes: 6 %
Neutrophils Absolute: 7.1 10*3/uL — ABNORMAL HIGH (ref 1.4–7.0)
Neutrophils: 64 %
Platelets: 470 10*3/uL — ABNORMAL HIGH (ref 150–450)
RBC: 4.45 x10E6/uL (ref 3.77–5.28)
RDW: 12.3 % (ref 11.7–15.4)
WBC: 11.1 10*3/uL — ABNORMAL HIGH (ref 3.4–10.8)

## 2023-08-04 LAB — LIPID PANEL WITH LDL/HDL RATIO
Cholesterol, Total: 174 mg/dL (ref 100–199)
HDL: 60 mg/dL (ref >39)
LDL Chol Calc (NIH): 85 mg/dL (ref 0–99)
LDL/HDL Ratio: 1.4 ratio (ref 0.0–3.2)
Triglycerides: 173 mg/dL — ABNORMAL HIGH (ref 0–149)
VLDL Cholesterol Cal: 29 mg/dL (ref 5–40)

## 2023-08-04 LAB — IRON,TIBC AND FERRITIN PANEL
Ferritin: 170 ng/mL — ABNORMAL HIGH (ref 15–150)
Iron Saturation: 43 % (ref 15–55)
Iron: 113 ug/dL (ref 27–159)
Total Iron Binding Capacity: 265 ug/dL (ref 250–450)
UIBC: 152 ug/dL (ref 131–425)

## 2023-08-04 LAB — TSH+FREE T4
Free T4: 1.47 ng/dL (ref 0.82–1.77)
TSH: 0.095 u[IU]/mL — ABNORMAL LOW (ref 0.450–4.500)

## 2023-08-04 LAB — B12 AND FOLATE PANEL
Folate: >20 ng/mL (ref >3.0)
Vitamin B-12: 640 pg/mL (ref 232–1245)

## 2023-08-04 LAB — HGB A1C W/O EAG: Hgb A1c MFr Bld: 6.3 % — ABNORMAL HIGH (ref 4.8–5.6)

## 2023-08-04 LAB — VITAMIN D 25 HYDROXY (VIT D DEFICIENCY, FRACTURES): Vit D, 25-Hydroxy: 39.7 ng/mL (ref 30.0–100.0)

## 2023-08-09 ENCOUNTER — Ambulatory Visit: Payer: Self-pay | Admitting: Physician Assistant

## 2023-08-09 ENCOUNTER — Encounter: Payer: Self-pay | Admitting: Physician Assistant

## 2023-08-09 VITALS — BP 130/68 | HR 69 | Temp 98.1°F | Resp 16 | Ht 66.0 in | Wt 200.4 lb

## 2023-08-09 DIAGNOSIS — I10 Essential (primary) hypertension: Secondary | ICD-10-CM

## 2023-08-09 DIAGNOSIS — F409 Phobic anxiety disorder, unspecified: Secondary | ICD-10-CM

## 2023-08-09 DIAGNOSIS — E876 Hypokalemia: Secondary | ICD-10-CM

## 2023-08-09 DIAGNOSIS — Z0001 Encounter for general adult medical examination with abnormal findings: Secondary | ICD-10-CM

## 2023-08-09 DIAGNOSIS — E039 Hypothyroidism, unspecified: Secondary | ICD-10-CM

## 2023-08-09 DIAGNOSIS — F5105 Insomnia due to other mental disorder: Secondary | ICD-10-CM

## 2023-08-09 DIAGNOSIS — D72829 Elevated white blood cell count, unspecified: Secondary | ICD-10-CM

## 2023-08-09 DIAGNOSIS — E1165 Type 2 diabetes mellitus with hyperglycemia: Secondary | ICD-10-CM

## 2023-08-09 MED ORDER — FLUTICASONE PROPIONATE 50 MCG/ACT NA SUSP: 2.0000 | Freq: Every day | NASAL | 6 refills | Status: DC

## 2023-08-09 MED ORDER — ZOLPIDEM TARTRATE ER 12.5 MG PO TBCR: 12.5000 mg | EXTENDED_RELEASE_TABLET | Freq: Every evening | ORAL | 2 refills | Status: DC | PRN

## 2023-08-09 MED ORDER — LEVOTHYROXINE SODIUM 150 MCG PO TABS: 150.0000 ug | ORAL_TABLET | Freq: Every day | ORAL | 1 refills | Status: DC

## 2023-08-09 NOTE — Progress Notes (Signed)
 Stephanie Larson [address] [place], [place] [address]  Internal MEDICINE  Office Visit Note  Patient Name: Stephanie Larson  [id number]  [id number]  Date of Service: 08/09/2023  Chief Complaint  Patient presents with   Annual Exam   Depression   Anxiety   Quality Metric Gaps    Mammogram     HPI Pt is here for routine health maintenance examination -doing well today, no new concerns -declines mammogram -declines colon CA screening options -Labs reviewed: WBC a little elevated/platelets, potassium low--had some V/D a little over a week ago. Woke up dizzy and nauseous and may have contributed to this. A1c improving and cholesterol improving. Ferritin a little elevated, but improved form previous. TSH suppressed still, will lower synthroid  dose and recheck in future -never could get xray, no technician available when she went. Foot doing better -BP stable  Current Medication: Outpatient Encounter Medications as of 08/09/2023  Medication Sig   amLODipine  (NORVASC ) 2.5 MG tablet Take 1 tablet (2.5 mg total) by mouth daily.   atorvastatin  (LIPITOR) 10 MG tablet Take 1 tablet (10 mg total) by mouth daily.   chlorhexidine  (PERIDEX ) 0.12 % solution Use as directed 10 mLs in the mouth or throat 2 (two) times daily.   fexofenadine  (ALLEGRA ) 180 MG tablet Take 1 tablet (180 mg total) by mouth daily.   fluticasone  (FLONASE ) 50 MCG/ACT nasal spray Place 2 sprays into both nostrils daily.   gabapentin  (NEURONTIN ) 300 MG capsule Take 1 capsule (300 mg total) by mouth 2 (two) times daily.   levothyroxine  (SYNTHROID ) 150 MCG tablet Take 1 tablet (150 mcg total) by mouth daily.   mometasone  (ELOCON ) 0.1 % cream APPLY TO AFFECTED AREAS FROM FACE TO LOWER EXTREMITIES TWICE A DAY TO SPOT TREAT DURING FLARES   montelukast  (SINGULAIR ) 10 MG tablet Take 1 tablet (10 mg total) by mouth at bedtime.   rOPINIRole  (REQUIP ) 1 MG tablet Take 1 tablet (1 mg total) by mouth at bedtime.   tiZANidine   (ZANAFLEX ) 4 MG tablet TAKE 1/2 TO 1 TABLET BY MOUTH 2 TIMES DAILY AS NEEDED FOR MUSCLE SPASMS   [DISCONTINUED] hydrOXYzine  (ATARAX ) 50 MG tablet Take 1 tablet (50 mg total) by mouth 3 (three) times daily as needed.   [DISCONTINUED] levothyroxine  (SYNTHROID ) 175 MCG tablet Take 1 tablet (175 mcg total) by mouth daily.   [DISCONTINUED] zolpidem  (AMBIEN  CR) 12.5 MG CR tablet Take 1 tablet (12.5 mg total) by mouth at bedtime as needed. for sleep   zolpidem  (AMBIEN  CR) 12.5 MG CR tablet Take 1 tablet (12.5 mg total) by mouth at bedtime as needed. for sleep   No facility-administered encounter medications on file as of 08/09/2023.    Surgical History: Past Surgical History:  Procedure Laterality Date   CYSTOSCOPY N/A 05/13/2019   Procedure: CYSTOSCOPY;  Surgeon: Kris Pester, MD;  Location: [facility];  Service: Gynecology;  Laterality: N/A;   LAPAROSCOPIC GASTRIC BANDING     TOTAL LAPAROSCOPIC HYSTERECTOMY WITH SALPINGECTOMY N/A 05/13/2019   Procedure: TOTAL LAPAROSCOPIC HYSTERECTOMY WITH BILATERAL SALPINGECTOMY;  Surgeon: Kris Pester, MD;  Location: [facility];  Service: Gynecology;  Laterality: N/A;   TUBAL LIGATION      Medical History: Past Medical History:  Diagnosis Date   Anxiety    Depression    Family history of ovarian cancer    qualifies for cancer genetic testing   Hypothyroidism    Insomnia    Memory changes    Sinus drainage    Sleep apnea  Thyroid  disease     Family History: Family History  Problem Relation Age of Onset   Colon cancer Paternal Grandfather    Ovarian cancer Paternal Grandmother 20   Hypertension Maternal Grandmother    Hypertension Father    Emphysema Father    ALS Mother    Hypertension Mother       Review of Systems  Constitutional:  Negative for chills, fatigue and unexpected weight change.  HENT:  Negative for congestion, rhinorrhea, sneezing and sore throat.   Eyes:  Negative for redness.  Respiratory:  Negative for cough,  chest tightness and shortness of breath.   Cardiovascular:  Negative for chest pain and palpitations.  Gastrointestinal:  Negative for abdominal pain, constipation, diarrhea, nausea and vomiting.  Genitourinary:  Negative for dysuria and frequency.  Musculoskeletal:  Negative for arthralgias, joint swelling and neck pain.       Right pinky toe injury  Skin:  Negative for rash.  Neurological: Negative.  Negative for tremors and numbness.  Hematological:  Negative for adenopathy. Does not bruise/bleed easily.  Psychiatric/Behavioral:  Negative for behavioral problems (Depression), sleep disturbance and suicidal ideas.      Vital Signs: BP 130/68   Pulse 69   Temp 98.1 F (36.7 C)   Resp 16   Ht 5\' 6"  (1.676 m)   Wt 200 lb 6.4 oz (90.9 kg)   LMP 04/18/2019 (Exact Date)   SpO2 93%   BMI 32.35 kg/m    Physical Exam Vitals and nursing note reviewed.  Constitutional:      General: She is not in acute distress.    Appearance: Normal appearance. She is obese. She is not ill-appearing.  HENT:     Head: Normocephalic and atraumatic.  Eyes:     Pupils: Pupils are equal, round, and reactive to light.  Cardiovascular:     Rate and Rhythm: Normal rate and regular rhythm.  Pulmonary:     Effort: Pulmonary effort is normal. No respiratory distress.  Chest:     Chest wall: No tenderness.  Breasts:    Right: Normal. No mass.     Left: Normal. No mass.  Abdominal:     General: Bowel sounds are normal.     Tenderness: There is no abdominal tenderness.  Musculoskeletal:        General: Signs of injury present. Normal range of motion.     Comments: Right pinky toe still non TTP. Able to move all toes.  Skin:    General: Skin is warm and dry.  Neurological:     Mental Status: She is alert and oriented to person, place, and time.  Psychiatric:        Mood and Affect: Mood normal.        Behavior: Behavior normal.      LABS: Recent Results (from the past 2160 hours)  CBC  w/Diff/Platelet     Status: Abnormal   Collection Time: 08/03/23  3:06 PM  Result Value Ref Range   WBC 11.1 (H) 3.4 - 10.8 x10E3/uL   RBC 4.45 3.77 - 5.28 x10E6/uL   Hemoglobin 12.3 11.1 - 15.9 g/dL   Hematocrit 16.1 09.6 - 46.6 %   MCV 81 79 - 97 fL   MCH 27.6 26.6 - 33.0 pg   MCHC 34.2 31.5 - 35.7 g/dL   RDW 04.5 40.9 - 81.1 %   Platelets 470 (H) 150 - 450 x10E3/uL   Neutrophils 64 Not Estab. %   Lymphs 26 Not Estab. %  Monocytes 6 Not Estab. %   Eos 3 Not Estab. %   Basos 1 Not Estab. %   Neutrophils Absolute 7.1 (H) 1.4 - 7.0 x10E3/uL   Lymphocytes Absolute 2.9 0.7 - 3.1 x10E3/uL   Monocytes Absolute 0.6 0.1 - 0.9 x10E3/uL   EOS (ABSOLUTE) 0.4 0.0 - 0.4 x10E3/uL   Basophils Absolute 0.1 0.0 - 0.2 x10E3/uL   Immature Granulocytes 0 Not Estab. %   Immature Grans (Abs) 0.0 0.0 - 0.1 x10E3/uL  Comprehensive metabolic panel     Status: Abnormal   Collection Time: 08/03/23  3:06 PM  Result Value Ref Range   Glucose 103 (H) 70 - 99 mg/dL   BUN 8 6 - 24 mg/dL   Creatinine, Ser 0.86 (H) 0.57 - 1.00 mg/dL   eGFR 60 >57 QI/ONG/2.95   BUN/Creatinine Ratio 7 (L) 9 - 23   Sodium 143 134 - 144 mmol/L   Potassium 2.9 (L) 3.5 - 5.2 mmol/L   Chloride 100 96 - 106 mmol/L   CO2 24 20 - 29 mmol/L   Calcium  9.5 8.7 - 10.2 mg/dL   Total Protein 6.8 6.0 - 8.5 g/dL   Albumin 4.4 3.8 - 4.9 g/dL   Globulin, Total 2.4 1.5 - 4.5 g/dL   Bilirubin Total 1.0 0.0 - 1.2 mg/dL   Alkaline Phosphatase 90 44 - 121 IU/L   AST 31 0 - 40 IU/L   ALT 25 0 - 32 IU/L  TSH + free T4     Status: Abnormal   Collection Time: 08/03/23  3:06 PM  Result Value Ref Range   TSH 0.095 (L) 0.450 - 4.500 uIU/mL   Free T4 1.47 0.82 - 1.77 ng/dL  Hgb M8U w/o eAG     Status: Abnormal   Collection Time: 08/03/23  3:06 PM  Result Value Ref Range   Hgb A1c MFr Bld 6.3 (H) 4.8 - 5.6 %    Comment:          Prediabetes: 5.7 - 6.4          Diabetes: >6.4          Glycemic control for adults with diabetes: <7.0   Lipid  Panel With LDL/HDL Ratio     Status: Abnormal   Collection Time: 08/03/23  3:06 PM  Result Value Ref Range   Cholesterol, Total 174 100 - 199 mg/dL   Triglycerides 132 (H) 0 - 149 mg/dL   HDL 60 >44 mg/dL   VLDL Cholesterol Cal 29 5 - 40 mg/dL   LDL Chol Calc (NIH) 85 0 - 99 mg/dL   LDL/HDL Ratio 1.4 0.0 - 3.2 ratio    Comment:                                     LDL/HDL Ratio                                             Men  Women                               1/2 Avg.Risk  1.0    1.5  Avg.Risk  3.6    3.2                                2X Avg.Risk  6.2    5.0                                3X Avg.Risk  8.0    6.1   Fe+TIBC+Fer     Status: Abnormal   Collection Time: 08/03/23  3:06 PM  Result Value Ref Range   Total Iron Binding Capacity 265 250 - 450 ug/dL   UIBC 147 829 - 562 ug/dL   Iron 130 27 - 865 ug/dL   Iron Saturation 43 15 - 55 %   Ferritin 170 (H) 15 - 150 ng/mL  B12 and Folate Panel     Status: None   Collection Time: 08/03/23  3:06 PM  Result Value Ref Range   Vitamin B-12 640 232 - 1,245 pg/mL   Folate >20.0 >3.0 ng/mL    Comment: A serum folate concentration of less than 3.1 ng/mL is considered to represent clinical deficiency.   VITAMIN D  25 Hydroxy (Vit-D Deficiency, Fractures)     Status: None   Collection Time: 08/03/23  3:06 PM  Result Value Ref Range   Vit D, 25-Hydroxy 39.7 30.0 - 100.0 ng/mL    Comment: Vitamin D  deficiency has been defined by the Institute of Medicine and an Endocrine Society practice guideline as a level of serum 25-OH vitamin D  less than 20 ng/mL (1,2). The Endocrine Society went on to further define vitamin D  insufficiency as a level between 21 and 29 ng/mL (2). 1. IOM (Institute of Medicine). 2010. Dietary reference    intakes for calcium  and D. [place]: The    [employer]. 2. Holick MF, Binkley [place], Bischoff-Ferrari HA, et al.    Evaluation, treatment, and prevention of  vitamin D     deficiency: an Endocrine Society clinical practice    guideline. JCEM. 2011 Jul; 96(7):1911-30.         Assessment/Plan: 1. Encounter for general adult medical examination with abnormal findings (Primary) CPE performed, labs reviewed, patient declines colon cancer screening as well as mammogram at this time  2. Essential hypertension Stable, continue current medication  3. Acquired hypothyroidism TSH suppressed therefore will lower Synthroid  dose.  Plan to recheck labs in future - levothyroxine  (SYNTHROID ) 150 MCG tablet; Take 1 tablet (150 mcg total) by mouth daily.  Dispense: 90 tablet; Refill: 1  4. Leukocytosis, unspecified type Patient ill at time of labs, will recheck - CBC w/Diff/Platelet  5. 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 for sleep  Dispense: 30 tablet; Refill: 2  7. Type 2 diabetes mellitus with hyperglycemia, without long-term current use of insulin (HCC) A1c improved to 6.3 from 6.7 last visit.  Continue to control with diet and exercise   General Counseling: Elexius verbalizes understanding of the findings of todays visit and agrees with plan of treatment. I have discussed any further diagnostic evaluation that may be needed or ordered today. We also reviewed her medications today. she has been encouraged to  call the office with any questions or concerns that should arise related to todays visit.    Counseling:    Orders Placed This Encounter  Procedures   CBC w/Diff/Platelet   Basic Metabolic Panel (BMET)    Meds ordered this encounter  Medications   levothyroxine  (SYNTHROID ) 150 MCG tablet    Sig: Take 1 tablet (150 mcg total) by mouth daily.    Dispense:  90 tablet    Refill:  1   fluticasone   (FLONASE ) 50 MCG/ACT nasal spray    Sig: Place 2 sprays into both nostrils daily.    Dispense:  16 g    Refill:  6   zolpidem  (AMBIEN  CR) 12.5 MG CR tablet    Sig: Take 1 tablet (12.5 mg total) by mouth at bedtime as needed. for sleep    Dispense:  30 tablet    Refill:  2    This patient was seen by Taylor Favia, PA-C in collaboration with Dr. Verneta Gone as a part of collaborative care agreement.  Total time spent:35 Minutes  Time spent includes review of chart, medications, test results, and follow up plan with the patient.     Lawton Price, MD  Internal Medicine

## 2023-08-20 ENCOUNTER — Other Ambulatory Visit: Payer: Self-pay | Admitting: Physician Assistant

## 2023-08-20 DIAGNOSIS — F411 Generalized anxiety disorder: Secondary | ICD-10-CM

## 2023-10-18 ENCOUNTER — Other Ambulatory Visit: Payer: Self-pay | Admitting: Physician Assistant

## 2023-10-18 DIAGNOSIS — I10 Essential (primary) hypertension: Secondary | ICD-10-CM

## 2023-11-08 ENCOUNTER — Ambulatory Visit: Payer: Self-pay | Admitting: Physician Assistant

## 2023-11-08 ENCOUNTER — Encounter: Payer: Self-pay | Admitting: Physician Assistant

## 2023-11-08 VITALS — BP 136/88 | HR 93 | Temp 98.0°F | Resp 16 | Ht 66.0 in | Wt 185.0 lb

## 2023-11-08 DIAGNOSIS — R110 Nausea: Secondary | ICD-10-CM

## 2023-11-08 DIAGNOSIS — G8929 Other chronic pain: Secondary | ICD-10-CM

## 2023-11-08 DIAGNOSIS — G2581 Restless legs syndrome: Secondary | ICD-10-CM

## 2023-11-08 DIAGNOSIS — I10 Essential (primary) hypertension: Secondary | ICD-10-CM

## 2023-11-08 DIAGNOSIS — F409 Phobic anxiety disorder, unspecified: Secondary | ICD-10-CM

## 2023-11-08 DIAGNOSIS — E039 Hypothyroidism, unspecified: Secondary | ICD-10-CM

## 2023-11-08 DIAGNOSIS — D72829 Elevated white blood cell count, unspecified: Secondary | ICD-10-CM

## 2023-11-08 DIAGNOSIS — F5105 Insomnia due to other mental disorder: Secondary | ICD-10-CM

## 2023-11-08 DIAGNOSIS — M545 Low back pain, unspecified: Secondary | ICD-10-CM

## 2023-11-08 DIAGNOSIS — E876 Hypokalemia: Secondary | ICD-10-CM

## 2023-11-08 MED ORDER — GABAPENTIN 300 MG PO CAPS: 300.0000 mg | ORAL_CAPSULE | Freq: Two times a day (BID) | ORAL | 2 refills | Status: DC

## 2023-11-08 MED ORDER — TIZANIDINE HCL 4 MG PO TABS: ORAL_TABLET | ORAL | 2 refills | Status: DC

## 2023-11-08 MED ORDER — ZOLPIDEM TARTRATE ER 12.5 MG PO TBCR: 12.5000 mg | EXTENDED_RELEASE_TABLET | Freq: Every evening | ORAL | 2 refills | Status: DC | PRN

## 2023-11-08 MED ORDER — ROPINIROLE HCL 1 MG PO TABS: 1.0000 mg | ORAL_TABLET | Freq: Every day | ORAL | 2 refills | Status: DC

## 2023-11-08 NOTE — Progress Notes (Signed)
 [facility] [address] [place], [place] [address]  Internal MEDICINE  Office Visit Note  Patient Name: Stephanie Larson  [id number]  [id number]  Date of Service: 11/08/2023  Chief Complaint  Patient presents with   Follow-up   Nausea    Ongoing for 2 weeks. Threw up a little last night.    HPI Pt is here for routine follow up -BP stable, At home 130s/80s  -nauseous for the last 2 weeks, thew up a little bile yesterday and almost reminded her of morning sickness but knows this couldn't be the case -otherwise feeling well -did have a tooth removed and was put to sleep for this around June 19th and unsure if due to this. Did have ABX prior to procedure -socket doing well now. Follow up went well -no fever or chills, appetite normal -nausea improved this morning -no diarrhea or changes in BM and no abdominal pain -did not have labs done yet and will reorder to include thyroid  as well since dose change  Current Medication: Outpatient Encounter Medications as of 11/08/2023  Medication Sig   amLODipine  (NORVASC ) 2.5 MG tablet TAKE 1 TABLET BY MOUTH DAILY   atorvastatin  (LIPITOR) 10 MG tablet Take 1 tablet (10 mg total) by mouth daily.   chlorhexidine  (PERIDEX ) 0.12 % solution Use as directed 10 mLs in the mouth or throat 2 (two) times daily.   fexofenadine  (ALLEGRA ) 180 MG tablet Take 1 tablet (180 mg total) by mouth daily.   fluticasone  (FLONASE ) 50 MCG/ACT nasal spray Place 2 sprays into both nostrils daily.   gabapentin  (NEURONTIN ) 300 MG capsule Take 1 capsule (300 mg total) by mouth 2 (two) times daily.   hydrOXYzine  (ATARAX ) 50 MG tablet TAKE 1 TABLET BY MOUTH 3 TIMES DAILY AS NEEDED   levothyroxine  (SYNTHROID ) 150 MCG tablet Take 1 tablet (150 mcg total) by mouth daily.   mometasone  (ELOCON ) 0.1 % cream APPLY TO AFFECTED AREAS FROM FACE TO LOWER EXTREMITIES TWICE A DAY TO SPOT TREAT DURING FLARES   montelukast  (SINGULAIR ) 10 MG tablet Take 1 tablet (10 mg total) by  mouth at bedtime.   rOPINIRole  (REQUIP ) 1 MG tablet Take 1 tablet (1 mg total) by mouth at bedtime.   tiZANidine  (ZANAFLEX ) 4 MG tablet TAKE 1/2 TO 1 TABLET BY MOUTH 2 TIMES DAILY AS NEEDED FOR MUSCLE SPASMS   zolpidem  (AMBIEN  CR) 12.5 MG CR tablet Take 1 tablet (12.5 mg total) by mouth at bedtime as needed. for sleep   No facility-administered encounter medications on file as of 11/08/2023.    Surgical History: Past Surgical History:  Procedure Laterality Date   CYSTOSCOPY N/A 05/13/2019   Procedure: CYSTOSCOPY;  Surgeon: Leonce Garnette JONETTA, MD;  Location: [facility];  Service: Gynecology;  Laterality: N/A;   LAPAROSCOPIC GASTRIC BANDING     TOTAL LAPAROSCOPIC HYSTERECTOMY WITH SALPINGECTOMY N/A 05/13/2019   Procedure: TOTAL LAPAROSCOPIC HYSTERECTOMY WITH BILATERAL SALPINGECTOMY;  Surgeon: Leonce Garnette JONETTA, MD;  Location: [facility];  Service: Gynecology;  Laterality: N/A;   TUBAL LIGATION      Medical History: Past Medical History:  Diagnosis Date   Anxiety    Depression    Family history of ovarian cancer    qualifies for cancer genetic testing   Hypothyroidism    Insomnia    Memory changes    Sinus drainage    Sleep apnea    Thyroid  disease     Family History: Family History  Problem Relation Age of Onset   Colon cancer Paternal  Grandfather    Ovarian cancer Paternal Grandmother 61   Hypertension Maternal Grandmother    Hypertension Father    Emphysema Father    ALS Mother    Hypertension Mother     Social History   Socioeconomic History   Marital status: Married    Spouse name: Not on file   Number of children: Not on file   Years of education: Not on file   Highest education level: Not on file  Occupational History   Not on file  Tobacco Use   Smoking status: Former    Current packs/day: 0.50    Types: Cigarettes   Smokeless tobacco: Never   Tobacco comments:    Pt quit 1 month ago 10/22  Vaping Use   Vaping status: Never Used  Substance and Sexual  Activity   Alcohol use: Not Currently    Comment: occasionally   Drug use: Never   Sexual activity: Yes    Birth control/protection: None  Other Topics Concern   Not on file  Social History Narrative   Not on file   Social Drivers of Health   Financial Resource Strain: Not on file  Food Insecurity: Not on file  Transportation Needs: Not on file  Physical Activity: Not on file  Stress: Not on file  Social Connections: Not on file  Intimate Partner Violence: Not on file      Review of Systems  Constitutional:  Negative for appetite change, chills, fatigue, fever and unexpected weight change.  HENT:  Negative for congestion, rhinorrhea, sneezing and sore throat.   Eyes:  Negative for redness.  Respiratory:  Negative for cough, chest tightness and shortness of breath.   Cardiovascular:  Negative for chest pain and palpitations.  Gastrointestinal:  Positive for nausea and vomiting. Negative for abdominal pain, constipation and diarrhea.  Genitourinary:  Negative for dysuria and frequency.  Musculoskeletal:  Negative for arthralgias, joint swelling and neck pain.  Skin:  Negative for rash.  Neurological: Negative.  Negative for tremors and numbness.  Hematological:  Negative for adenopathy. Does not bruise/bleed easily.  Psychiatric/Behavioral:  Negative for behavioral problems (Depression), sleep disturbance and suicidal ideas.     Vital Signs: BP (!) 132/91   Pulse 93   Temp 98 F (36.7 C)   Resp 16   Ht 5' 6 (1.676 m)   Wt 185 lb (83.9 kg)   LMP 04/18/2019 (Exact Date)   SpO2 98%   BMI 29.86 kg/m    Physical Exam Vitals and nursing note reviewed.  Constitutional:      General: She is not in acute distress.    Appearance: Normal appearance. She is obese. She is not ill-appearing.  HENT:     Head: Normocephalic and atraumatic.  Eyes:     Extraocular Movements: Extraocular movements intact.     Pupils: Pupils are equal, round, and reactive to light.   Cardiovascular:     Rate and Rhythm: Normal rate and regular rhythm.  Pulmonary:     Effort: Pulmonary effort is normal. No respiratory distress.  Chest:     Chest wall: No tenderness.  Breasts:    Right: Normal. No mass.     Left: Normal. No mass.  Musculoskeletal:        General: Normal range of motion.  Skin:    General: Skin is warm and dry.  Neurological:     Mental Status: She is alert and oriented to person, place, and time.  Psychiatric:  Mood and Affect: Mood normal.        Behavior: Behavior normal.        Assessment/Plan: 1. Essential hypertension (Primary) Stable, continue current medication  2. Nausea Improving today and no other symptoms.  Will continue to monitor.  Instructed to ensure that she has good appetite and is drinking plenty of fluids.  Will also check labs  3. Acquired hypothyroidism Will update labs for monitoring - TSH + free T4  4. Leukocytosis, unspecified type - CBC w/Diff/Platelet  5. 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 for sleep  Dispense: 30 tablet; Refill: 2  7. Chronic midline low back pain without sciatica - tiZANidine  (ZANAFLEX ) 4 MG tablet; TAKE 1/2 TO 1 TABLET BY MOUTH 2 TIMES DAILY AS NEEDED FOR MUSCLE SPASMS  Dispense: 60 tablet; Refill: 2 - gabapentin  (NEURONTIN ) 300 MG capsule; Take 1 capsule (300 mg total) by mouth 2 (two) times daily.  Dispense: 60 capsule; Refill: 2  8. Restless leg syndrome - rOPINIRole  (REQUIP ) 1 MG tablet; Take 1 tablet (1 mg total) by mouth at bedtime.  Dispense: 30 tablet; Refill: 2 - gabapentin  (NEURONTIN ) 300 MG capsule; Take 1 capsule (300 mg total) by mouth 2 (two) times daily.  Dispense: 60 capsule; Refill: 2   General Counseling: Cozetta verbalizes understanding of the findings of todays visit and agrees with plan of treatment. I have  discussed any further diagnostic evaluation that may be needed or ordered today. We also reviewed her medications today. she has been encouraged to call the office with any questions or concerns that should arise related to todays visit.    No orders of the defined types were placed in this encounter.   No orders of the defined types were placed in this encounter.   This patient was seen by Tinnie Pro, PA-C in collaboration with Dr. Sigrid Bathe as a part of collaborative care agreement.   Total time spent:30 Minutes Time spent includes review of chart, medications, test results, and follow up plan with the patient.      Dr Fozia M Khan Internal medicine

## 2023-11-09 ENCOUNTER — Ambulatory Visit: Payer: Self-pay | Admitting: Physician Assistant

## 2023-11-09 DIAGNOSIS — D72829 Elevated white blood cell count, unspecified: Secondary | ICD-10-CM

## 2023-11-09 DIAGNOSIS — D75839 Thrombocytosis, unspecified: Secondary | ICD-10-CM

## 2023-11-09 DIAGNOSIS — E876 Hypokalemia: Secondary | ICD-10-CM

## 2023-11-09 LAB — TSH+FREE T4
Free T4: 1.33 ng/dL (ref 0.82–1.77)
TSH: 0.153 u[IU]/mL — ABNORMAL LOW (ref 0.450–4.500)

## 2023-11-09 LAB — CBC WITH DIFFERENTIAL/PLATELET
Basophils Absolute: 0.1 x10E3/uL (ref 0.0–0.2)
Basos: 1 %
EOS (ABSOLUTE): 0 x10E3/uL (ref 0.0–0.4)
Eos: 0 %
Hematocrit: 40.2 % (ref 34.0–46.6)
Hemoglobin: 13.4 g/dL (ref 11.1–15.9)
Immature Grans (Abs): 0 x10E3/uL (ref 0.0–0.1)
Immature Granulocytes: 0 %
Lymphocytes Absolute: 2.5 x10E3/uL (ref 0.7–3.1)
Lymphs: 20 %
MCH: 28.2 pg (ref 26.6–33.0)
MCHC: 33.3 g/dL (ref 31.5–35.7)
MCV: 85 fL (ref 79–97)
Monocytes Absolute: 0.7 x10E3/uL (ref 0.1–0.9)
Monocytes: 6 %
Neutrophils Absolute: 9.3 x10E3/uL — ABNORMAL HIGH (ref 1.4–7.0)
Neutrophils: 73 %
Platelets: 549 x10E3/uL — ABNORMAL HIGH (ref 150–450)
RBC: 4.76 x10E6/uL (ref 3.77–5.28)
RDW: 13.1 % (ref 11.7–15.4)
WBC: 12.6 x10E3/uL — ABNORMAL HIGH (ref 3.4–10.8)

## 2023-11-09 LAB — BASIC METABOLIC PANEL WITH GFR
BUN/Creatinine Ratio: 8 — ABNORMAL LOW (ref 9–23)
BUN: 9 mg/dL (ref 6–24)
CO2: 23 mmol/L (ref 20–29)
Calcium: 9.8 mg/dL (ref 8.7–10.2)
Chloride: 98 mmol/L (ref 96–106)
Creatinine, Ser: 1.11 mg/dL — ABNORMAL HIGH (ref 0.57–1.00)
Glucose: 125 mg/dL — ABNORMAL HIGH (ref 70–99)
Potassium: 3.1 mmol/L — ABNORMAL LOW (ref 3.5–5.2)
Sodium: 142 mmol/L (ref 134–144)
eGFR: 60 mL/min/1.73 (ref >59)

## 2023-11-09 MED ORDER — POTASSIUM CHLORIDE ER 8 MEQ PO TBCR: EXTENDED_RELEASE_TABLET | ORAL | 0 refills | Status: DC

## 2023-11-09 NOTE — Telephone Encounter (Signed)
-----   Message from Tinnie MARLA Pro sent at 11/09/2023 12:31 PM EDT ----- Please let her know that her TSH is still low but improving. Have her take 1/2 tab 1 day per week and full tab remaining days. Potassium a little low and may be due to recent vomiting. Recommend  increasing potassium in diet and I can send potassium to take 3 days per week for 2 weeks. WBC and platelets still high and need to be monitored further. I am ordering repeat blood work and should go  in 3 weeks. If any abdominal pain, fever or other symptoms arise please call or go to ED ----- Message ----- From: Interface, [facility] Lab Results In Sent: 11/09/2023   5:36 AM EDT To: Tinnie MARLA Pro, PA-C

## 2023-11-09 NOTE — Telephone Encounter (Signed)
 Spoke with patient regarding labs. Pt to repeat labs in 3 weeks, advised to go to ED if symptoms worsen.

## 2023-11-16 ENCOUNTER — Other Ambulatory Visit: Payer: Self-pay | Admitting: Physician Assistant

## 2023-11-16 DIAGNOSIS — F411 Generalized anxiety disorder: Secondary | ICD-10-CM

## 2024-02-07 ENCOUNTER — Encounter: Payer: Self-pay | Admitting: Physician Assistant

## 2024-02-07 ENCOUNTER — Ambulatory Visit: Payer: Self-pay | Admitting: Physician Assistant

## 2024-02-07 VITALS — BP 131/70 | HR 78 | Temp 98.0°F | Resp 16 | Ht 66.0 in | Wt 183.0 lb

## 2024-02-07 DIAGNOSIS — G8929 Other chronic pain: Secondary | ICD-10-CM

## 2024-02-07 DIAGNOSIS — G2581 Restless legs syndrome: Secondary | ICD-10-CM

## 2024-02-07 DIAGNOSIS — J301 Allergic rhinitis due to pollen: Secondary | ICD-10-CM

## 2024-02-07 DIAGNOSIS — E039 Hypothyroidism, unspecified: Secondary | ICD-10-CM

## 2024-02-07 DIAGNOSIS — N951 Menopausal and female climacteric states: Secondary | ICD-10-CM

## 2024-02-07 DIAGNOSIS — D75839 Thrombocytosis, unspecified: Secondary | ICD-10-CM

## 2024-02-07 DIAGNOSIS — M545 Low back pain, unspecified: Secondary | ICD-10-CM

## 2024-02-07 DIAGNOSIS — E1165 Type 2 diabetes mellitus with hyperglycemia: Secondary | ICD-10-CM

## 2024-02-07 DIAGNOSIS — F5105 Insomnia due to other mental disorder: Secondary | ICD-10-CM

## 2024-02-07 DIAGNOSIS — E782 Mixed hyperlipidemia: Secondary | ICD-10-CM

## 2024-02-07 DIAGNOSIS — E876 Hypokalemia: Secondary | ICD-10-CM

## 2024-02-07 DIAGNOSIS — F409 Phobic anxiety disorder, unspecified: Secondary | ICD-10-CM

## 2024-02-07 LAB — POCT GLYCOSYLATED HEMOGLOBIN (HGB A1C): Hemoglobin A1C: 5.7 % — AB (ref 4.0–5.6)

## 2024-02-07 MED ORDER — TIZANIDINE HCL 4 MG PO TABS: ORAL_TABLET | ORAL | 2 refills | Status: DC

## 2024-02-07 MED ORDER — ZOLPIDEM TARTRATE ER 12.5 MG PO TBCR: 12.5000 mg | EXTENDED_RELEASE_TABLET | Freq: Every evening | ORAL | 2 refills | Status: DC | PRN

## 2024-02-07 MED ORDER — MONTELUKAST SODIUM 10 MG PO TABS: 10.0000 mg | ORAL_TABLET | Freq: Every day | ORAL | 1 refills | Status: AC

## 2024-02-07 MED ORDER — ROPINIROLE HCL 1 MG PO TABS: 1.0000 mg | ORAL_TABLET | Freq: Every day | ORAL | 2 refills | Status: DC

## 2024-02-07 MED ORDER — GABAPENTIN 300 MG PO CAPS: 300.0000 mg | ORAL_CAPSULE | Freq: Two times a day (BID) | ORAL | 2 refills | Status: DC

## 2024-02-07 NOTE — Progress Notes (Signed)
 [facility] [address] [place], [place] [address]  Internal MEDICINE  Office Visit Note  Patient Name: Stephanie Larson  [id number]  [id number]  Date of Service: 02/07/2024  Chief Complaint  Patient presents with   Follow-up   Depression   Menopause    Having symptoms: hair loss, emotional imbalance, hot flashes    HPI Pt is here for routine follow up -having more hair loss, night sweats, occasional hot flashes, little emotional. Pt had hysterectomy several years ago so she is unsure if perimenopausal, but symptoms may point to this -Sundays taking 1/2 tab synthroid  and full tab remaining days for the past 3 months and needs updated testing -will order labs -needs her med refills, taking ambien  for sleep nightly -BP well controlled  Current Medication: Outpatient Encounter Medications as of 02/07/2024  Medication Sig   amLODipine  (NORVASC ) 2.5 MG tablet TAKE 1 TABLET BY MOUTH DAILY   atorvastatin  (LIPITOR) 10 MG tablet Take 1 tablet (10 mg total) by mouth daily.   fexofenadine  (ALLEGRA ) 180 MG tablet Take 1 tablet (180 mg total) by mouth daily.   fluticasone  (FLONASE ) 50 MCG/ACT nasal spray Place 2 sprays into both nostrils daily.   gabapentin  (NEURONTIN ) 300 MG capsule Take 1 capsule (300 mg total) by mouth 2 (two) times daily.   hydrOXYzine  (ATARAX ) 50 MG tablet TAKE 1 TABLET BY MOUTH 3 TIMES DAILY AS NEEDED   levothyroxine  (SYNTHROID ) 150 MCG tablet Take 1 tablet (150 mcg total) by mouth daily.   mometasone  (ELOCON ) 0.1 % cream APPLY TO AFFECTED AREAS FROM FACE TO LOWER EXTREMITIES TWICE A DAY TO SPOT TREAT DURING FLARES   montelukast  (SINGULAIR ) 10 MG tablet Take 1 tablet (10 mg total) by mouth at bedtime.   rOPINIRole  (REQUIP ) 1 MG tablet Take 1 tablet (1 mg total) by mouth at bedtime.   tiZANidine  (ZANAFLEX ) 4 MG tablet TAKE 1/2 TO 1 TABLET BY MOUTH 2 TIMES DAILY AS NEEDED FOR MUSCLE SPASMS   zolpidem  (AMBIEN  CR) 12.5 MG CR tablet Take 1 tablet (12.5 mg total) by  mouth at bedtime as needed. for sleep   [DISCONTINUED] chlorhexidine  (PERIDEX ) 0.12 % solution Use as directed 10 mLs in the mouth or throat 2 (two) times daily.   [DISCONTINUED] potassium chloride  (KLOR-CON ) 8 MEQ tablet Take 1 tablet by mouth MWF   No facility-administered encounter medications on file as of 02/07/2024.    Surgical History: Past Surgical History:  Procedure Laterality Date   CYSTOSCOPY N/A 05/13/2019   Procedure: CYSTOSCOPY;  Surgeon: Leonce Garnette JONETTA, MD;  Location: [facility];  Service: Gynecology;  Laterality: N/A;   LAPAROSCOPIC GASTRIC BANDING     TOTAL LAPAROSCOPIC HYSTERECTOMY WITH SALPINGECTOMY N/A 05/13/2019   Procedure: TOTAL LAPAROSCOPIC HYSTERECTOMY WITH BILATERAL SALPINGECTOMY;  Surgeon: Leonce Garnette JONETTA, MD;  Location: [facility];  Service: Gynecology;  Laterality: N/A;   TUBAL LIGATION      Medical History: Past Medical History:  Diagnosis Date   Anxiety    Depression    Family history of ovarian cancer    qualifies for cancer genetic testing   Hypothyroidism    Insomnia    Memory changes    Sinus drainage    Sleep apnea    Thyroid  disease     Family History: Family History  Problem Relation Age of Onset   Colon cancer Paternal Grandfather    Ovarian cancer Paternal Grandmother 56   Hypertension Maternal Grandmother    Hypertension Father    Emphysema Father  ALS Mother    Hypertension Mother     Social History   Socioeconomic History   Marital status: Married    Spouse name: Not on file   Number of children: Not on file   Years of education: Not on file   Highest education level: Not on file  Occupational History   Not on file  Tobacco Use   Smoking status: Former    Current packs/day: 0.50    Types: Cigarettes   Smokeless tobacco: Never   Tobacco comments:    Pt quit 1 month ago 10/22  Vaping Use   Vaping status: Never Used  Substance and Sexual Activity   Alcohol use: Not Currently    Comment: occasionally   Drug  use: Never   Sexual activity: Yes    Birth control/protection: None  Other Topics Concern   Not on file  Social History Narrative   Not on file   Social Drivers of Health   Financial Resource Strain: Not on file  Food Insecurity: Not on file  Transportation Needs: Not on file  Physical Activity: Not on file  Stress: Not on file  Social Connections: Not on file  Intimate Partner Violence: Not on file      Review of Systems  Constitutional:  Negative for chills, fatigue and unexpected weight change.  HENT:  Negative for congestion, rhinorrhea, sneezing and sore throat.   Eyes:  Negative for redness.  Respiratory:  Negative for cough, chest tightness and shortness of breath.   Cardiovascular:  Negative for chest pain and palpitations.  Gastrointestinal:  Negative for abdominal pain, constipation, diarrhea, nausea and vomiting.  Endocrine:       Night sweats and hot flashes, hair loss  Genitourinary:  Negative for dysuria and frequency.  Musculoskeletal:  Negative for arthralgias, joint swelling and neck pain.  Skin:  Negative for rash.  Neurological: Negative.  Negative for tremors and numbness.  Hematological:  Negative for adenopathy. Does not bruise/bleed easily.  Psychiatric/Behavioral:  Positive for dysphoric mood and sleep disturbance. Negative for behavioral problems (Depression) and suicidal ideas.     Vital Signs: BP 131/70   Pulse 78   Temp 98 F (36.7 C)   Resp 16   Ht 5' 6 (1.676 m)   Wt 183 lb (83 kg)   LMP 04/18/2019 (Exact Date)   SpO2 98%   BMI 29.54 kg/m    Physical Exam Vitals and nursing note reviewed.  Constitutional:      General: She is not in acute distress.    Appearance: Normal appearance. She is obese. She is not ill-appearing.  HENT:     Head: Normocephalic and atraumatic.  Eyes:     Extraocular Movements: Extraocular movements intact.  Cardiovascular:     Rate and Rhythm: Normal rate and regular rhythm.  Pulmonary:     Effort:  Pulmonary effort is normal. No respiratory distress.  Chest:     Chest wall: No tenderness.  Breasts:    Right: Normal. No mass.     Left: Normal. No mass.  Musculoskeletal:        General: Normal range of motion.  Skin:    General: Skin is warm and dry.  Neurological:     Mental Status: She is alert and oriented to person, place, and time.  Psychiatric:        Mood and Affect: Mood normal.        Behavior: Behavior normal.        Assessment/Plan: 1. Type  2 diabetes mellitus with hyperglycemia, without long-term current use of insulin (HCC) (Primary) - POCT HgB A1C is 5.7 and is in prediabetic range. Continue to control with diet and exercise - Urine Microalbumin w/creat. ratio  2. Vasomotor symptoms due to menopause Will check labs. Discussed options such as effexor as non-hormonal option vs HRT pending lab results and if no contraindications - FSH/LH - Estradiol  3. Thrombocytosis Will need to update labs for monitoring, may need hematology - CBC w/Diff/Platelet  4. Low blood potassium Will recheck labs - Comprehensive metabolic panel with GFR  5. Acquired hypothyroidism Will update labs and adjust accordingly as this could be contributing to current symptoms - TSH + free T4  6. Mixed hyperlipidemia Continue atorvastatin   7. Chronic midline low back pain without sciatica - gabapentin  (NEURONTIN ) 300 MG capsule; Take 1 capsule (300 mg total) by mouth 2 (two) times daily.  Dispense: 60 capsule; Refill: 2 - tiZANidine  (ZANAFLEX ) 4 MG tablet; TAKE 1/2 TO 1 TABLET BY MOUTH 2 TIMES DAILY AS NEEDED FOR MUSCLE SPASMS  Dispense: 60 tablet; Refill: 2  8. Restless leg syndrome - gabapentin  (NEURONTIN ) 300 MG capsule; Take 1 capsule (300 mg total) by mouth 2 (two) times daily.  Dispense: 60 capsule; Refill: 2 - rOPINIRole  (REQUIP ) 1 MG tablet; Take 1 tablet (1 mg total) by mouth at bedtime.  Dispense: 30 tablet; Refill: 2  9. Seasonal allergic rhinitis due to pollen -  montelukast  (SINGULAIR ) 10 MG tablet; Take 1 tablet (10 mg total) by mouth at bedtime.  Dispense: 90 tablet; Refill: 1  10. Insomnia due to anxiety and fear May continue ambien  as before - zolpidem  (AMBIEN  CR) 12.5 MG CR tablet; Take 1 tablet (12.5 mg total) by mouth at bedtime as needed. for sleep  Dispense: 30 tablet; Refill: 2   General Counseling: Delanda verbalizes understanding of the findings of todays visit and agrees with plan of treatment. I have discussed any further diagnostic evaluation that may be needed or ordered today. We also reviewed her medications today. she has been encouraged to call the office with any questions or concerns that should arise related to todays visit.    Orders Placed This Encounter  Procedures   Urine Microalbumin w/creat. ratio   POCT HgB A1C    No orders of the defined types were placed in this encounter.   This patient was seen by Tinnie Pro, PA-C in collaboration with Dr. Sigrid Bathe as a part of collaborative care agreement.   Total time spent:30 Minutes Time spent includes review of chart, medications, test results, and follow up plan with the patient.      Dr Fozia M Khan Internal medicine

## 2024-02-08 LAB — CBC WITH DIFFERENTIAL/PLATELET
Basophils Absolute: 0.1 x10E3/uL (ref 0.0–0.2)
Basos: 1 %
EOS (ABSOLUTE): 0 x10E3/uL (ref 0.0–0.4)
Eos: 0 %
Hematocrit: 37.9 % (ref 34.0–46.6)
Hemoglobin: 12.8 g/dL (ref 11.1–15.9)
Immature Grans (Abs): 0 x10E3/uL (ref 0.0–0.1)
Immature Granulocytes: 0 %
Lymphocytes Absolute: 3.3 x10E3/uL — ABNORMAL HIGH (ref 0.7–3.1)
Lymphs: 25 %
MCH: 28.5 pg (ref 26.6–33.0)
MCHC: 33.8 g/dL (ref 31.5–35.7)
MCV: 84 fL (ref 79–97)
Monocytes Absolute: 0.8 x10E3/uL (ref 0.1–0.9)
Monocytes: 6 %
Neutrophils Absolute: 9 x10E3/uL — ABNORMAL HIGH (ref 1.4–7.0)
Neutrophils: 68 %
Platelets: 484 x10E3/uL — ABNORMAL HIGH (ref 150–450)
RBC: 4.49 x10E6/uL (ref 3.77–5.28)
RDW: 13 % (ref 11.7–15.4)
WBC: 13.3 x10E3/uL — ABNORMAL HIGH (ref 3.4–10.8)

## 2024-02-08 LAB — COMPREHENSIVE METABOLIC PANEL WITH GFR
ALT: 20 IU/L (ref 0–32)
AST: 26 IU/L (ref 0–40)
Albumin: 4.3 g/dL (ref 3.8–4.9)
Alkaline Phosphatase: 86 IU/L (ref 49–135)
BUN/Creatinine Ratio: 6 — ABNORMAL LOW (ref 9–23)
BUN: 8 mg/dL (ref 6–24)
Bilirubin Total: 1 mg/dL (ref 0.0–1.2)
CO2: 24 mmol/L (ref 20–29)
Calcium: 9.7 mg/dL (ref 8.7–10.2)
Chloride: 102 mmol/L (ref 96–106)
Creatinine, Ser: 1.35 mg/dL — ABNORMAL HIGH (ref 0.57–1.00)
Globulin, Total: 2.6 g/dL (ref 1.5–4.5)
Glucose: 99 mg/dL (ref 70–99)
Potassium: 3.6 mmol/L (ref 3.5–5.2)
Sodium: 141 mmol/L (ref 134–144)
Total Protein: 6.9 g/dL (ref 6.0–8.5)
eGFR: 48 mL/min/1.73 — ABNORMAL LOW (ref >59)

## 2024-02-08 LAB — TSH+FREE T4
Free T4: 1.38 ng/dL (ref 0.82–1.77)
TSH: 0.524 u[IU]/mL (ref 0.450–4.500)

## 2024-02-08 LAB — FSH/LH
FSH: 48.8 m[IU]/mL
LH: 27.6 m[IU]/mL

## 2024-02-08 LAB — ESTRADIOL: Estradiol: 61.7 pg/mL

## 2024-02-14 ENCOUNTER — Other Ambulatory Visit: Payer: Self-pay | Admitting: Physician Assistant

## 2024-02-14 DIAGNOSIS — E039 Hypothyroidism, unspecified: Secondary | ICD-10-CM

## 2024-02-14 DIAGNOSIS — F411 Generalized anxiety disorder: Secondary | ICD-10-CM

## 2024-02-20 ENCOUNTER — Ambulatory Visit: Payer: Self-pay | Admitting: Physician Assistant

## 2024-02-20 DIAGNOSIS — D72829 Elevated white blood cell count, unspecified: Secondary | ICD-10-CM

## 2024-02-20 DIAGNOSIS — D75839 Thrombocytosis, unspecified: Secondary | ICD-10-CM

## 2024-02-20 NOTE — Telephone Encounter (Signed)
-----   Message from Tinnie MARLA Pro sent at 02/20/2024 12:44 PM EDT ----- Please let her know that her platelets and WBC continue to be elevated, will refer to hematology for further evaluation. Kidney function also went down some, Would like to consider renal US  if she is  open to this. A1c improved to 5.7. thyroid  levels look good. Hormone levels consistent with menopausal changes and we can discuss possible hormone replacement options after hematology evaluation if  interested. ----- Message ----- From: Almer Bi, CMA Sent: 02/07/2024   3:44 PM EDT To: Tinnie MARLA Pro, PA-C

## 2024-02-20 NOTE — Telephone Encounter (Signed)
 Spoke with patient regarding labs. Pt denies needing to see hematology.

## 2024-02-20 NOTE — Telephone Encounter (Signed)
LVM for patient regarding labs 

## 2024-03-17 ENCOUNTER — Other Ambulatory Visit: Payer: Self-pay | Admitting: Physician Assistant

## 2024-04-15 ENCOUNTER — Other Ambulatory Visit: Payer: Self-pay | Admitting: Physician Assistant

## 2024-04-15 DIAGNOSIS — I10 Essential (primary) hypertension: Secondary | ICD-10-CM

## 2024-04-17 ENCOUNTER — Encounter: Payer: Self-pay | Admitting: Internal Medicine

## 2024-04-17 ENCOUNTER — Telehealth: Payer: Self-pay | Admitting: Internal Medicine

## 2024-04-17 VITALS — Resp 16

## 2024-04-17 DIAGNOSIS — J0101 Acute recurrent maxillary sinusitis: Secondary | ICD-10-CM

## 2024-04-17 DIAGNOSIS — H1010 Acute atopic conjunctivitis, unspecified eye: Secondary | ICD-10-CM

## 2024-04-17 DIAGNOSIS — J309 Allergic rhinitis, unspecified: Secondary | ICD-10-CM

## 2024-04-17 MED ORDER — AZITHROMYCIN 250 MG PO TABS: ORAL_TABLET | ORAL | 0 refills | Status: AC

## 2024-04-17 NOTE — Progress Notes (Signed)
 [facility] [address] [place], [place] [address]  Internal MEDICINE  Telephone Visit  Patient Name: Stephanie Larson  [id number]  [id number]  Date of Service: 05/14/2024  I connected with the patient at 1010 by telephone and verified the patients identity using two identifiers.   I discussed the limitations, risks, security and privacy concerns of performing an evaluation and management service by telephone and the availability of in person appointments. I also discussed with the patient that there may be a patient responsible charge related to the service.  The patient expressed understanding and agrees to proceed.    Chief Complaint  Patient presents with   Telephone Screen    [phone]   Telephone Assessment   Sinusitis    Poss sinus infection, ongoing since Saturday. Taking Allegra  and Singulair .    HPI Pt is connected for acute and sick visit All symptoms are noted as above     Current Medication: Outpatient Encounter Medications as of 04/17/2024  Medication Sig   atorvastatin  (LIPITOR) 10 MG tablet Take 1 tablet (10 mg total) by mouth daily.   [EXPIRED] azithromycin  (ZITHROMAX ) 250 MG tablet Take 2 tablets on day 1, then 1 tablet daily on days 2 through 5   fexofenadine  (ALLEGRA ) 180 MG tablet Take 1 tablet (180 mg total) by mouth daily.   fluticasone  (FLONASE ) 50 MCG/ACT nasal spray PLACE 2 SPRAYS INTO BOTH NOSTRILS DAILY   mometasone  (ELOCON ) 0.1 % cream APPLY TO AFFECTED AREAS FROM FACE TO LOWER EXTREMITIES TWICE A DAY TO SPOT TREAT DURING FLARES   montelukast  (SINGULAIR ) 10 MG tablet Take 1 tablet (10 mg total) by mouth at bedtime.   SYNTHROID  150 MCG tablet TAKE 1 TABLET BY MOUTH DAILY   [DISCONTINUED] amLODipine  (NORVASC ) 2.5 MG tablet TAKE 1 TABLET BY MOUTH DAILY   [DISCONTINUED] gabapentin  (NEURONTIN ) 300 MG capsule Take 1 capsule (300 mg total) by mouth 2 (two) times daily.   [DISCONTINUED] hydrOXYzine  (ATARAX ) 50 MG tablet TAKE 1 TABLET BY MOUTH 3  TIMES DAILY AS NEEDED   [DISCONTINUED] rOPINIRole  (REQUIP ) 1 MG tablet Take 1 tablet (1 mg total) by mouth at bedtime.   [DISCONTINUED] tiZANidine  (ZANAFLEX ) 4 MG tablet TAKE 1/2 TO 1 TABLET BY MOUTH 2 TIMES DAILY AS NEEDED FOR MUSCLE SPASMS   [DISCONTINUED] zolpidem  (AMBIEN  CR) 12.5 MG CR tablet Take 1 tablet (12.5 mg total) by mouth at bedtime as needed. for sleep   No facility-administered encounter medications on file as of 04/17/2024.    Surgical History: Past Surgical History:  Procedure Laterality Date   CYSTOSCOPY N/A 05/13/2019   Procedure: CYSTOSCOPY;  Surgeon: Leonce Garnette JONETTA, MD;  Location: [facility];  Service: Gynecology;  Laterality: N/A;   LAPAROSCOPIC GASTRIC BANDING     TOTAL LAPAROSCOPIC HYSTERECTOMY WITH SALPINGECTOMY N/A 05/13/2019   Procedure: TOTAL LAPAROSCOPIC HYSTERECTOMY WITH BILATERAL SALPINGECTOMY;  Surgeon: Leonce Garnette JONETTA, MD;  Location: [facility];  Service: Gynecology;  Laterality: N/A;   TUBAL LIGATION      Medical History: Past Medical History:  Diagnosis Date   Anxiety    Depression    Family history of ovarian cancer    qualifies for cancer genetic testing   Hypothyroidism    Insomnia    Memory changes    Sinus drainage    Sleep apnea    Thyroid  disease     Family History: Family History  Problem Relation Age of Onset   Colon cancer Paternal Grandfather    Ovarian cancer Paternal Grandmother 74  Hypertension Maternal Grandmother    Hypertension Father    Emphysema Father    ALS Mother    Hypertension Mother     Social History   Socioeconomic History   Marital status: Married    Spouse name: Not on file   Number of children: Not on file   Years of education: Not on file   Highest education level: Not on file  Occupational History   Not on file  Tobacco Use   Smoking status: Former    Current packs/day: 0.50    Types: Cigarettes   Smokeless tobacco: Never   Tobacco comments:    Pt quit 10/22  Vaping Use   Vaping  status: Never Used  Substance and Sexual Activity   Alcohol use: Not Currently    Comment: occasionally   Drug use: Never   Sexual activity: Yes    Birth control/protection: None  Other Topics Concern   Not on file  Social History Narrative   Not on file   Social Drivers of Health   Tobacco Use: Medium Risk (05/12/2024)   Patient History    Smoking Tobacco Use: Former    Smokeless Tobacco Use: Never    Passive Exposure: Not on Actuary Strain: Not on file  Food Insecurity: Not on file  Transportation Needs: Not on file  Physical Activity: Not on file  Stress: Not on file  Social Connections: Not on file  Intimate Partner Violence: Not on file  Depression (PHQ2-9): Low Risk (02/07/2024)   Depression (PHQ2-9)    PHQ-2 Score: 0  Alcohol Screen: Low Risk (02/07/2024)   Alcohol Screen    Last Alcohol Screening Score (AUDIT): 2  Housing: Not on file  Utilities: Not on file  Health Literacy: Not on file      Review of Systems  Constitutional:  Negative for fatigue and fever.  HENT:  Positive for postnasal drip and sinus pressure. Negative for congestion and mouth sores.   Respiratory:  Positive for cough.   Cardiovascular:  Negative for chest pain.  Genitourinary:  Negative for flank pain.  Psychiatric/Behavioral: Negative.      Vital Signs: Resp 16   LMP 04/18/2019    Observation/Objective: Looks well, but nasal congestion during conversation     Assessment/Plan: 1. Acute recurrent maxillary sinusitis (Primary) Take medications as prescribed, might need CT sinuses if symptoms dont resolve or recure - azithromycin  (ZITHROMAX ) 250 MG tablet; Take 2 tablets on day 1, then 1 tablet daily on days 2 through 5  Dispense: 6 tablet; Refill: 0  2. Allergic rhinoconjunctivitis Flonase , allegra  and singuliar as before      General Counseling: Stephanie Larson verbalizes understanding of the findings of today's phone visit and agrees with plan of treatment. I have  discussed any further diagnostic evaluation that may be needed or ordered today. We also reviewed her medications today. she has been encouraged to call the office with any questions or concerns that should arise related to todays visit.    No orders of the defined types were placed in this encounter.   Meds ordered this encounter  Medications   azithromycin  (ZITHROMAX ) 250 MG tablet    Sig: Take 2 tablets on day 1, then 1 tablet daily on days 2 through 5    Dispense:  6 tablet    Refill:  0    Time spent:25 Minutes    Dr Sigrid CHRISTELLA Bathe Internal medicine

## 2024-05-02 ENCOUNTER — Other Ambulatory Visit: Payer: Self-pay

## 2024-05-02 ENCOUNTER — Emergency Department: Payer: Self-pay

## 2024-05-02 ENCOUNTER — Observation Stay
Admission: EM | Admit: 2024-05-02 | Discharge: 2024-05-03 | Disposition: A | Discharge status: Home / Self Care | Payer: Self-pay | Attending: Internal Medicine | Admitting: Internal Medicine

## 2024-05-02 ENCOUNTER — Encounter: Payer: Self-pay | Admitting: Emergency Medicine

## 2024-05-02 DIAGNOSIS — I959 Hypotension, unspecified: Principal | ICD-10-CM | POA: Diagnosis present

## 2024-05-02 DIAGNOSIS — M5412 Radiculopathy, cervical region: Secondary | ICD-10-CM | POA: Diagnosis present

## 2024-05-02 DIAGNOSIS — F409 Phobic anxiety disorder, unspecified: Secondary | ICD-10-CM

## 2024-05-02 DIAGNOSIS — R55 Syncope and collapse: Principal | ICD-10-CM | POA: Insufficient documentation

## 2024-05-02 DIAGNOSIS — M545 Low back pain, unspecified: Secondary | ICD-10-CM

## 2024-05-02 DIAGNOSIS — E872 Acidosis, unspecified: Secondary | ICD-10-CM | POA: Insufficient documentation

## 2024-05-02 DIAGNOSIS — E782 Mixed hyperlipidemia: Secondary | ICD-10-CM

## 2024-05-02 DIAGNOSIS — E876 Hypokalemia: Secondary | ICD-10-CM

## 2024-05-02 DIAGNOSIS — F411 Generalized anxiety disorder: Secondary | ICD-10-CM | POA: Diagnosis present

## 2024-05-02 DIAGNOSIS — R6510 Systemic inflammatory response syndrome (SIRS) of non-infectious origin without acute organ dysfunction: Principal | ICD-10-CM

## 2024-05-02 DIAGNOSIS — T465X1A Poisoning by other antihypertensive drugs, accidental (unintentional), initial encounter: Secondary | ICD-10-CM

## 2024-05-02 DIAGNOSIS — Z79899 Other long term (current) drug therapy: Secondary | ICD-10-CM | POA: Insufficient documentation

## 2024-05-02 DIAGNOSIS — N179 Acute kidney failure, unspecified: Secondary | ICD-10-CM

## 2024-05-02 DIAGNOSIS — G2581 Restless legs syndrome: Secondary | ICD-10-CM | POA: Insufficient documentation

## 2024-05-02 DIAGNOSIS — E039 Hypothyroidism, unspecified: Secondary | ICD-10-CM | POA: Diagnosis present

## 2024-05-02 LAB — ACETAMINOPHEN LEVEL: Acetaminophen (Tylenol), Serum: <10 ug/mL — ABNORMAL LOW (ref 10–30)

## 2024-05-02 LAB — CBC WITH DIFFERENTIAL/PLATELET
Abs Immature Granulocytes: 0.09 K/uL — ABNORMAL HIGH (ref 0.00–0.07)
Basophils Absolute: 0.1 K/uL (ref 0.0–0.1)
Basophils Relative: 0 %
Eosinophils Absolute: 0 K/uL (ref 0.0–0.5)
Eosinophils Relative: 0 %
HCT: 34.2 % — ABNORMAL LOW (ref 36.0–46.0)
Hemoglobin: 11.5 g/dL — ABNORMAL LOW (ref 12.0–15.0)
Immature Granulocytes: 1 %
Lymphocytes Relative: 13 %
Lymphs Abs: 2.1 K/uL (ref 0.7–4.0)
MCH: 28.4 pg (ref 26.0–34.0)
MCHC: 33.6 g/dL (ref 30.0–36.0)
MCV: 84.4 fL (ref 80.0–100.0)
Monocytes Absolute: 0.8 K/uL (ref 0.1–1.0)
Monocytes Relative: 5 %
Neutro Abs: 12.4 K/uL — ABNORMAL HIGH (ref 1.7–7.7)
Neutrophils Relative %: 81 %
Platelets: 418 K/uL — ABNORMAL HIGH (ref 150–400)
RBC: 4.05 MIL/uL (ref 3.87–5.11)
RDW: 13.5 % (ref 11.5–15.5)
WBC: 15.4 K/uL — ABNORMAL HIGH (ref 4.0–10.5)
nRBC: 0 % (ref 0.0–0.2)

## 2024-05-02 LAB — URINE DRUG SCREEN
Amphetamines: NEGATIVE
Barbiturates: NEGATIVE
Benzodiazepines: NEGATIVE
Cocaine: NEGATIVE
Fentanyl: NEGATIVE
Methadone Scn, Ur: NEGATIVE
Opiates: NEGATIVE
Tetrahydrocannabinol: POSITIVE — AB

## 2024-05-02 LAB — ETHANOL: Alcohol, Ethyl (B): <15 mg/dL

## 2024-05-02 LAB — COMPREHENSIVE METABOLIC PANEL WITH GFR
ALT: 20 U/L (ref 0–44)
AST: 24 U/L (ref 15–41)
Albumin: 4 g/dL (ref 3.5–5.0)
Alkaline Phosphatase: 76 U/L (ref 38–126)
Anion gap: 14 (ref 5–15)
BUN: 13 mg/dL (ref 6–20)
CO2: 25 mmol/L (ref 22–32)
Calcium: 9.3 mg/dL (ref 8.9–10.3)
Chloride: 104 mmol/L (ref 98–111)
Creatinine, Ser: 2.49 mg/dL — ABNORMAL HIGH (ref 0.44–1.00)
GFR, Estimated: 23 mL/min — ABNORMAL LOW
Glucose, Bld: 122 mg/dL — ABNORMAL HIGH (ref 70–99)
Potassium: 2.5 mmol/L — CL (ref 3.5–5.1)
Sodium: 142 mmol/L (ref 135–145)
Total Bilirubin: 0.7 mg/dL (ref 0.0–1.2)
Total Protein: 6.4 g/dL — ABNORMAL LOW (ref 6.5–8.1)

## 2024-05-02 LAB — URINALYSIS, ROUTINE W REFLEX MICROSCOPIC
Bilirubin Urine: NEGATIVE
Glucose, UA: 50 mg/dL — AB
Hgb urine dipstick: NEGATIVE
Ketones, ur: NEGATIVE mg/dL
Leukocytes,Ua: NEGATIVE
Nitrite: NEGATIVE
Protein, ur: NEGATIVE mg/dL
Specific Gravity, Urine: 1.005 (ref 1.005–1.030)
pH: 7 (ref 5.0–8.0)

## 2024-05-02 LAB — TSH: TSH: 0.856 u[IU]/mL (ref 0.350–4.500)

## 2024-05-02 LAB — TROPONIN T, HIGH SENSITIVITY
Troponin T High Sensitivity: 28 ng/L — ABNORMAL HIGH (ref 0–19)
Troponin T High Sensitivity: 22 ng/L — ABNORMAL HIGH (ref 0–19)

## 2024-05-02 LAB — LACTIC ACID, PLASMA
Lactic Acid, Venous: 2.1 mmol/L (ref 0.5–1.9)
Lactic Acid, Venous: 2 mmol/L (ref 0.5–1.9)

## 2024-05-02 LAB — SALICYLATE LEVEL: Salicylate Lvl: <7 mg/dL — ABNORMAL LOW (ref 7.0–30.0)

## 2024-05-02 LAB — T4, FREE: Free T4: 1.19 ng/dL (ref 0.80–2.00)

## 2024-05-02 LAB — PROTIME-INR
INR: 1 (ref 0.8–1.2)
Prothrombin Time: 13.4 s (ref 11.4–15.2)

## 2024-05-02 MED ORDER — LACTATED RINGERS IV BOLUS
1000.0000 mL | Freq: Once | INTRAVENOUS | Status: AC
  Administered 2024-05-02: 1000 mL via INTRAVENOUS

## 2024-05-02 MED ORDER — GABAPENTIN 300 MG PO CAPS
300.0000 mg | ORAL_CAPSULE | Freq: Two times a day (BID) | ORAL | Status: DC
  Administered 2024-05-02 – 2024-05-03 (×2): 300 mg via ORAL
  Filled 2024-05-02 (×2): qty 1

## 2024-05-02 MED ORDER — CALCIUM GLUCONATE-NACL 1-0.675 GM/50ML-% IV SOLN
1.0000 g | Freq: Once | INTRAVENOUS | Status: DC
  Filled 2024-05-02: qty 50

## 2024-05-02 MED ORDER — SODIUM CHLORIDE 0.9 % IV SOLN
2.0000 g | Freq: Two times a day (BID) | INTRAVENOUS | Status: DC
  Administered 2024-05-03: 2 g via INTRAVENOUS
  Filled 2024-05-02: qty 12.5

## 2024-05-02 MED ORDER — HYDROXYZINE HCL 25 MG PO TABS
50.0000 mg | ORAL_TABLET | Freq: Three times a day (TID) | ORAL | Status: DC | PRN
  Filled 2024-05-02: qty 2

## 2024-05-02 MED ORDER — ACETAMINOPHEN 650 MG RE SUPP: 650.0000 mg | Freq: Four times a day (QID) | RECTAL | Status: DC | PRN

## 2024-05-02 MED ORDER — METRONIDAZOLE 500 MG/100ML IV SOLN
500.0000 mg | Freq: Once | INTRAVENOUS | Status: AC
  Administered 2024-05-02: 500 mg via INTRAVENOUS
  Filled 2024-05-02: qty 100

## 2024-05-02 MED ORDER — ONDANSETRON HCL 4 MG PO TABS: 4.0000 mg | ORAL_TABLET | Freq: Four times a day (QID) | ORAL | Status: DC | PRN

## 2024-05-02 MED ORDER — SODIUM CHLORIDE 0.9 % IV SOLN
2.0000 g | Freq: Once | INTRAVENOUS | Status: AC
  Administered 2024-05-02: 2 g via INTRAVENOUS
  Filled 2024-05-02: qty 12.5

## 2024-05-02 MED ORDER — ZOLPIDEM TARTRATE 5 MG PO TABS
5.0000 mg | ORAL_TABLET | Freq: Every evening | ORAL | Status: DC | PRN
  Administered 2024-05-02: 5 mg via ORAL
  Filled 2024-05-02: qty 1

## 2024-05-02 MED ORDER — ROPINIROLE HCL 1 MG PO TABS
1.0000 mg | ORAL_TABLET | Freq: Every day | ORAL | Status: DC
  Filled 2024-05-02: qty 1

## 2024-05-02 MED ORDER — ENOXAPARIN SODIUM 60 MG/0.6ML IJ SOSY
0.5000 mg/kg | PREFILLED_SYRINGE | INTRAMUSCULAR | Status: DC
  Administered 2024-05-02: 50 mg via SUBCUTANEOUS
  Filled 2024-05-02: qty 0.6

## 2024-05-02 MED ORDER — ACETAMINOPHEN 325 MG PO TABS: 650.0000 mg | ORAL_TABLET | Freq: Four times a day (QID) | ORAL | Status: DC | PRN

## 2024-05-02 MED ORDER — POTASSIUM CHLORIDE 10 MEQ/100ML IV SOLN
10.0000 meq | INTRAVENOUS | Status: DC
  Administered 2024-05-02 – 2024-05-03 (×3): 10 meq via INTRAVENOUS
  Filled 2024-05-02 (×2): qty 100

## 2024-05-02 MED ORDER — VANCOMYCIN HCL 1500 MG/300ML IV SOLN
1500.0000 mg | Freq: Once | INTRAVENOUS | Status: AC
  Administered 2024-05-02: 1500 mg via INTRAVENOUS
  Filled 2024-05-02: qty 300

## 2024-05-02 MED ORDER — VANCOMYCIN VARIABLE DOSE PER UNSTABLE RENAL FUNCTION (PHARMACIST DOSING): Status: DC

## 2024-05-02 MED ORDER — METRONIDAZOLE 500 MG/100ML IV SOLN
500.0000 mg | Freq: Two times a day (BID) | INTRAVENOUS | Status: DC
  Administered 2024-05-03: 500 mg via INTRAVENOUS
  Filled 2024-05-02: qty 100

## 2024-05-02 MED ORDER — LEVOTHYROXINE SODIUM 50 MCG PO TABS
150.0000 ug | ORAL_TABLET | Freq: Every day | ORAL | Status: DC
  Administered 2024-05-03: 150 ug via ORAL
  Filled 2024-05-02: qty 1

## 2024-05-02 MED ORDER — LACTATED RINGERS IV SOLN
150.0000 mL/h | INTRAVENOUS | Status: DC
  Administered 2024-05-02 – 2024-05-03 (×2): 150 mL/h via INTRAVENOUS

## 2024-05-02 MED ORDER — ATORVASTATIN CALCIUM 20 MG PO TABS
10.0000 mg | ORAL_TABLET | Freq: Every day | ORAL | Status: DC
  Administered 2024-05-02 – 2024-05-03 (×2): 10 mg via ORAL
  Filled 2024-05-02 (×2): qty 1

## 2024-05-02 MED ORDER — VANCOMYCIN HCL IN DEXTROSE 1-5 GM/200ML-% IV SOLN
1000.0000 mg | Freq: Once | INTRAVENOUS | Status: AC
  Administered 2024-05-02: 1000 mg via INTRAVENOUS
  Filled 2024-05-02: qty 200

## 2024-05-02 MED ORDER — NOREPINEPHRINE 4 MG/250ML-% IV SOLN
0.0000 ug/min | INTRAVENOUS | Status: DC
  Filled 2024-05-02: qty 250

## 2024-05-02 MED ORDER — POTASSIUM CHLORIDE 10 MEQ/100ML IV SOLN
10.0000 meq | INTRAVENOUS | Status: AC
  Administered 2024-05-02 (×2): 10 meq via INTRAVENOUS
  Filled 2024-05-02 (×3): qty 100

## 2024-05-02 MED ORDER — LACTATED RINGERS IV BOLUS (SEPSIS): 1000.0000 mL | Freq: Once | INTRAVENOUS | Status: DC

## 2024-05-02 MED ORDER — LACTATED RINGERS IV BOLUS (SEPSIS): 800.0000 mL | Freq: Once | INTRAVENOUS | Status: DC

## 2024-05-02 MED ORDER — ONDANSETRON HCL 4 MG/2ML IJ SOLN: 4.0000 mg | Freq: Four times a day (QID) | INTRAMUSCULAR | Status: DC | PRN

## 2024-05-02 MED ORDER — LACTATED RINGERS IV SOLN: INTRAVENOUS | Status: DC

## 2024-05-02 MED ORDER — TIZANIDINE HCL 2 MG PO TABS
2.0000 mg | ORAL_TABLET | Freq: Three times a day (TID) | ORAL | Status: DC | PRN
  Filled 2024-05-02: qty 1

## 2024-05-02 MED ORDER — HYDROCODONE-ACETAMINOPHEN 5-325 MG PO TABS
1.0000 | ORAL_TABLET | ORAL | Status: DC | PRN
  Administered 2024-05-03: 1 via ORAL
  Filled 2024-05-02: qty 1

## 2024-05-02 NOTE — H&P (Signed)
 " History and Physical    Patient: Stephanie Larson DOB: 11/23/1970 DOA: 05/02/2024 DOS: the patient was seen and examined on 05/02/2024 PCP: Kristina Tinnie POUR, PA-C  Patient coming from: Home  Chief Complaint:  Chief Complaint  Patient presents with   Chest Pain   Hypertension    HPI: Stephanie Larson is a 54 y.o. female with medical history significant for Hypertension, hypothyroidism, anxiety and depression, being admitted with hypotension suspect secondary to medication overuse, versus occult infection with sepsis. Patient's daughter and son at bedside contribute to history.  Patient states she was in her usual state of health until last night when during an argument with her husband she developed headache neck pain and chest pain.  She checked her blood pressure and it was elevated at 160/100.  She states she took a couple blood pressure pills that she thinks might have been her husband's.  She denies self-harm intent and states it was a mistake that she had intended to take an extra 2 pills of her amlodipine  to get her blood pressure down.  The following morning on awakening, her daughter found her to be lethargic with slurring of her speech.  She was able to get up but then fell, hitting her left knee.  She continued to have tightness on the left side of her neck, and believing her blood pressure to be still elevated to one of her blood pressure medications.  Due to ongoing lethargy and weakness she was brought to the ED. Patient denies fever or chills, nausea or vomiting, abdominal pain or change in bowel habits or dysuria and denies cough or congestion.  She states prior to today argument with her husband she was in her usual state of health. Upon arrival, BP 42/32 with pulse in the 50s.  Other vitals within normal limits.  BP was fluid responsive to 113/79 by admission. Lab work revealed WBC of 15,000 with a lactic acid of 2.0.  Hemoglobin 11.5 down from baseline of 12. Potassium 2.5.   Creatinine 2.49 up from baseline of 1.11. Troponin 22, TSH 0.856 EtOH, acetaminophen  and salicylate acid levels undetectable UDS pending UA unremarkable Chest x-ray clear EKG with sinus bradycardia at 56  Patient treated empirically for sepsis with sepsis fluids and broad-spectrum antibiotics of vancomycin , cefepime  and Flagyl , given IV potassium repletion  Admission requested     Review of Systems: As mentioned in the history of present illness. All other systems reviewed and are negative.  Past Medical History:  Diagnosis Date   Anxiety    Depression    Family history of ovarian cancer    qualifies for cancer genetic testing   Hypothyroidism    Insomnia    Memory changes    Sinus drainage    Sleep apnea    Thyroid  disease    Past Surgical History:  Procedure Laterality Date   CYSTOSCOPY N/A 05/13/2019   Procedure: CYSTOSCOPY;  Surgeon: Leonce Garnette JONETTA, MD;  Location: [facility];  Service: Gynecology;  Laterality: N/A;   LAPAROSCOPIC GASTRIC BANDING     TOTAL LAPAROSCOPIC HYSTERECTOMY WITH SALPINGECTOMY N/A 05/13/2019   Procedure: TOTAL LAPAROSCOPIC HYSTERECTOMY WITH BILATERAL SALPINGECTOMY;  Surgeon: Leonce Garnette JONETTA, MD;  Location: [facility];  Service: Gynecology;  Laterality: N/A;   TUBAL LIGATION     Social History:  reports that she has quit smoking. Her smoking use included cigarettes. She has never used smokeless tobacco. She reports that she does not currently use alcohol. She reports that she does  not use drugs.  Allergies[1]  Family History  Problem Relation Age of Onset   Colon cancer Paternal Grandfather    Ovarian cancer Paternal Grandmother 41   Hypertension Maternal Grandmother    Hypertension Father    Emphysema Father    ALS Mother    Hypertension Mother     Prior to Admission medications  Medication Sig Start Date End Date Taking? Authorizing Provider  amLODipine  (NORVASC ) 2.5 MG tablet TAKE 1 TABLET BY MOUTH DAILY 04/15/24  Yes McDonough,  Lauren K, PA-C  atorvastatin  (LIPITOR) 10 MG tablet Take 1 tablet (10 mg total) by mouth daily. 07/19/23  Yes McDonough, Lauren K, PA-C  fexofenadine  (ALLEGRA ) 180 MG tablet Take 1 tablet (180 mg total) by mouth daily. 08/08/21  Yes McDonough, Lauren K, PA-C  fluticasone  (FLONASE ) 50 MCG/ACT nasal spray PLACE 2 SPRAYS INTO BOTH NOSTRILS DAILY 03/17/24  Yes McDonough, Lauren K, PA-C  gabapentin  (NEURONTIN ) 300 MG capsule Take 1 capsule (300 mg total) by mouth 2 (two) times daily. 02/07/24  Yes McDonough, Lauren K, PA-C  hydrOXYzine  (ATARAX ) 50 MG tablet TAKE 1 TABLET BY MOUTH 3 TIMES DAILY AS NEEDED 02/15/24  Yes McDonough, Lauren K, PA-C  mometasone  (ELOCON ) 0.1 % cream APPLY TO AFFECTED AREAS FROM FACE TO LOWER EXTREMITIES TWICE A DAY TO SPOT TREAT DURING FLARES 11/17/21  Yes McDonough, Lauren K, PA-C  montelukast  (SINGULAIR ) 10 MG tablet Take 1 tablet (10 mg total) by mouth at bedtime. 02/07/24  Yes McDonough, Lauren K, PA-C  rOPINIRole  (REQUIP ) 1 MG tablet Take 1 tablet (1 mg total) by mouth at bedtime. 02/07/24  Yes McDonough, Lauren K, PA-C  SYNTHROID  150 MCG tablet TAKE 1 TABLET BY MOUTH DAILY 02/15/24  Yes McDonough, Lauren K, PA-C  tiZANidine  (ZANAFLEX ) 4 MG tablet TAKE 1/2 TO 1 TABLET BY MOUTH 2 TIMES DAILY AS NEEDED FOR MUSCLE SPASMS 02/07/24  Yes McDonough, Lauren K, PA-C  zolpidem  (AMBIEN  CR) 12.5 MG CR tablet Take 1 tablet (12.5 mg total) by mouth at bedtime as needed. for sleep 02/13/24  Yes Kristina Tinnie POUR, PA-C    Physical Exam: Vitals:   05/02/24 1800 05/02/24 1830 05/02/24 1945 05/02/24 2000  BP: 106/80 103/69 95/63 113/79  Pulse: 62 60 67 70  Resp: 14 15 19 15   Temp:      TempSrc:      SpO2: 100% 100% 100% 100%  Weight:      Height:       Physical Exam Vitals and nursing note reviewed.  Constitutional:      General: She is not in acute distress. HENT:     Head: Normocephalic and atraumatic.  Cardiovascular:     Rate and Rhythm: Normal rate and regular rhythm.      Heart sounds: Normal heart sounds.  Pulmonary:     Effort: Pulmonary effort is normal.     Breath sounds: Normal breath sounds.  Abdominal:     Palpations: Abdomen is soft.     Tenderness: There is no abdominal tenderness.  Neurological:     Mental Status: Mental status is at baseline.     Labs on Admission: I have personally reviewed following labs and imaging studies  CBC: Recent Labs  Lab 05/02/24 1653  WBC 15.4*  NEUTROABS 12.4*  HGB 11.5*  HCT 34.2*  MCV 84.4  PLT 418*   Basic Metabolic Panel: Recent Labs  Lab 05/02/24 1653  NA 142  K 2.5*  CL 104  CO2 25  GLUCOSE 122*  BUN 13  CREATININE 2.49*  CALCIUM  9.3   GFR: Estimated Creatinine Clearance: 32.2 mL/min (A) (by C-G formula based on SCr of 2.49 mg/dL (H)). Liver Function Tests: Recent Labs  Lab 05/02/24 1653  AST 24  ALT 20  ALKPHOS 76  BILITOT 0.7  PROT 6.4*  ALBUMIN 4.0   No results for input(s): LIPASE, AMYLASE in the last 168 hours. No results for input(s): AMMONIA in the last 168 hours. Coagulation Profile: Recent Labs  Lab 05/02/24 1653  INR 1.0   Cardiac Enzymes: No results for input(s): CKTOTAL, CKMB, CKMBINDEX, TROPONINI in the last 168 hours. BNP (last 3 results) No results for input(s): PROBNP in the last 8760 hours. HbA1C: No results for input(s): HGBA1C in the last 72 hours. CBG: No results for input(s): GLUCAP in the last 168 hours. Lipid Profile: No results for input(s): CHOL, HDL, LDLCALC, TRIG, CHOLHDL, LDLDIRECT in the last 72 hours. Thyroid  Function Tests: Recent Labs    05/02/24 1653  TSH 0.856  FREET4 1.19   Anemia Panel: No results for input(s): VITAMINB12, FOLATE, FERRITIN, TIBC, IRON, RETICCTPCT in the last 72 hours. Urine analysis:    Component Value Date/Time   COLORURINE STRAW (A) 05/02/2024 1635   APPEARANCEUR CLEAR (A) 05/02/2024 1635   APPEARANCEUR Clear 08/03/2022 1529   LABSPEC 1.005 05/02/2024 1635    PHURINE 7.0 05/02/2024 1635   GLUCOSEU 50 (A) 05/02/2024 1635   HGBUR NEGATIVE 05/02/2024 1635   BILIRUBINUR NEGATIVE 05/02/2024 1635   BILIRUBINUR Negative 08/03/2022 1529   KETONESUR NEGATIVE 05/02/2024 1635   PROTEINUR NEGATIVE 05/02/2024 1635   UROBILINOGEN 0.2 04/12/2018 1213   NITRITE NEGATIVE 05/02/2024 1635   LEUKOCYTESUR NEGATIVE 05/02/2024 1635    Radiological Exams on Admission: DG Chest Portable 1 View Result Date: 05/02/2024 EXAM: 1 VIEW XRAY OF THE CHEST 05/02/2024 06:24:40 PM COMPARISON: None available. CLINICAL HISTORY: weakness FINDINGS: LUNGS AND PLEURA: No focal pulmonary opacity. No pleural effusion. No pneumothorax. HEART AND MEDIASTINUM: No acute abnormality of the cardiac and mediastinal silhouettes. BONES AND SOFT TISSUES: No acute osseous abnormality. IMPRESSION: 1. No acute cardiopulmonary process. Electronically signed by: Morgane Naveau MD 05/02/2024 07:15 PM EST RP Workstation: [id number]   Data Reviewed for HPI: Relevant notes from primary care and specialist visits, past discharge summaries as available in EHR, including Care Everywhere. Prior diagnostic testing as pertinent to current admission diagnoses Updated medications and problem lists for reconciliation ED course, including vitals, labs, imaging, treatment and response to treatment Triage notes, nursing and pharmacy notes and ED provider's notes Notable results as noted above in HPI      Assessment and Plan: * SIRS (systemic inflammatory response syndrome) (HCC) SIRS with lactic acidosis, possible severe sepsis Patient presents with hypotension in the setting of taking more antihypertensives than prescribed SIRS criteria include hypotension, leukocytosis and lactic acidosis, AKI No signs or stigmata of acute infection-and no infectious source identified on workup Will treat empirically for sepsis pending blood culture results Continue sepsis fluids Continue broad-spectrum antibiotics Will  get procalcitonin  Hypotension Presyncope/fall Accidental overdose of antihypertensive  Patient states she took extra doses of antihypertensives for BP of 160/100 and associated with headache, neck and chest pain Awoke the following day lethargic with slurred speech Hold all antihypertensives and resume as appropriate Fall precautions IV fluids Treat possible sepsis as outlined above  AKI (acute kidney injury) Creatinine 2.49 up from baseline of 1.11 Likely ATN related to hypotension with renal hypoperfusion Expecting improvement to baseline with IV fluid resuscitation  Hypokalemia Oral and IV repletion  and monitor Uncertain etiology  GAD (generalized anxiety disorder) Continue home hydroxyzine  as needed  Hypothyroidism Continue levothyroxine   Cervical radiculopathy Restless leg syndrome Polypharmacy Continue home meds with close BP monitoring Currently on gabapentin , ropinirole , Zanaflex  as well as Ambien  for sleep     DVT prophylaxis: Lovenox   Consults: none  Advance Care Planning:   Code Status: Prior   Family Communication: Son and daughter at bedside  Disposition Plan: Back to previous home environment  Severity of Illness: The appropriate patient status for this patient is OBSERVATION. Observation status is judged to be reasonable and necessary in order to provide the required intensity of service to ensure the patient's safety. The patient's presenting symptoms, physical exam findings, and initial radiographic and laboratory data in the context of their medical condition is felt to place them at decreased risk for further clinical deterioration. Furthermore, it is anticipated that the patient will be medically stable for discharge from the hospital within 2 midnights of admission.   Author: Delayne LULLA Solian, MD 05/02/2024 10:20 PM  For on call review [email or web address].      [1]  Allergies Allergen Reactions   Seroquel [Quetiapine Fumarate] Other (See  Comments)    nightmares   "

## 2024-05-02 NOTE — Assessment & Plan Note (Signed)
 Restless leg syndrome Polypharmacy Continue home meds with close BP monitoring Currently on gabapentin , ropinirole , Zanaflex  as well as Ambien  for sleep

## 2024-05-02 NOTE — Assessment & Plan Note (Signed)
 Continue home hydroxyzine  as needed

## 2024-05-02 NOTE — Progress Notes (Signed)
 Anticoagulation monitoring(Lovenox):  54 yo female ordered Lovenox 40 mg Q24h    Filed Weights   05/02/24 1623  Weight: 102.1 kg (225 lb)   BMI 36.3   Lab Results  Component Value Date   CREATININE 2.49 (H) 05/02/2024   CREATININE 1.35 (H) 02/07/2024   CREATININE 1.11 (H) 11/08/2023   Estimated Creatinine Clearance: 32.2 mL/min (A) (by C-G formula based on SCr of 2.49 mg/dL (H)). Hemoglobin & Hematocrit     Component Value Date/Time   HGB 11.5 (L) 05/02/2024 1653   HGB 12.8 02/07/2024 1642   HCT 34.2 (L) 05/02/2024 1653   HCT 37.9 02/07/2024 1642     Per Protocol for Patient with estCrcl > 30 ml/min and BMI > 30, will transition to Lovenox 50 mg Q24h.

## 2024-05-02 NOTE — Sepsis Progress Note (Signed)
 Sepsis protocol monitored by eLink

## 2024-05-02 NOTE — Assessment & Plan Note (Signed)
 SIRS with lactic acidosis, possible severe sepsis Patient presents with hypotension in the setting of taking more antihypertensives than prescribed SIRS criteria include hypotension, leukocytosis and lactic acidosis, AKI No signs or stigmata of acute infection-and no infectious source identified on workup Will treat empirically for sepsis pending blood culture results Continue sepsis fluids Continue broad-spectrum antibiotics Will get procalcitonin

## 2024-05-02 NOTE — Assessment & Plan Note (Signed)
 Oral and IV repletion and monitor Uncertain etiology

## 2024-05-02 NOTE — Assessment & Plan Note (Signed)
"   Continue levothyroxine          "

## 2024-05-02 NOTE — Consult Note (Signed)
 CODE SEPSIS - PHARMACY COMMUNICATION  **Broad Spectrum Antibiotics should be administered within 1 hour of Sepsis diagnosis**  Time Code Sepsis Called/Page Received: 1714  Antibiotics Ordered: vancomycin, ceftriaxone, flagyl  Time of 1st antibiotic administration: 1747  Additional action taken by pharmacy: n/a  If necessary, Name of Provider/Nurse Contacted: n/a    Cliford Sequeira A Karina Nofsinger ,PharmD Clinical Pharmacist  05/02/2024  5:29 PM

## 2024-05-02 NOTE — Hospital Course (Signed)
 Stephanie Larson

## 2024-05-02 NOTE — ED Provider Notes (Signed)
 "  [facility] Provider Note    Event Date/Time   First MD Initiated Contact with Patient 05/02/24 1627     (approximate)   History   Chest Pain and Hypertension   HPI  Stephanie Larson is a 54 y.o. female with a past medical history of hypothyroidism, hypertension, presenting to the emergency department for chest pain, abdominal pain, and a headache.  The patient reports that she has noticed that her blood pressure has been running high.  Because of this she decided to take extra of her blood pressure medication.  Is unclear whether she took extra last night and/or this morning.  She is unable to provide further history.     Physical Exam   Triage Vital Signs: ED Triage Vitals  Encounter Vitals Group     BP 05/02/24 1643 (!) 42/32     Girls Systolic BP Percentile --      Girls Diastolic BP Percentile --      Boys Systolic BP Percentile --      Boys Diastolic BP Percentile --      Pulse Rate 05/02/24 1625 62     Resp 05/02/24 1625 18     Temp 05/02/24 1649 98.2 F (36.8 C)     Temp Source 05/02/24 1625 Oral     SpO2 05/02/24 1625 100 %     Weight 05/02/24 1623 225 lb (102.1 kg)     Height 05/02/24 1623 5' 6 (1.676 m)     Head Circumference --      Peak Flow --      Pain Score 05/02/24 1623 8     Pain Loc --      Pain Education --      Exclude from Growth Chart --     Most recent vital signs: Vitals:   05/02/24 1643 05/02/24 1649  BP: (!) 42/32   Pulse:    Resp:    Temp:  98.2 F (36.8 C)  SpO2:       General: Drowsy but responds to verbal stimuli CV:  Good peripheral perfusion.  Resp:  Normal effort.  Abd:  No distention.  Other:     ED Results / Procedures / Treatments   Labs (all labs ordered are listed, but only abnormal results are displayed) Labs Reviewed  CBC WITH DIFFERENTIAL/PLATELET - Abnormal; Notable for the following components:      Result Value   WBC 15.4 (*)    Hemoglobin 11.5 (*)    HCT 34.2 (*)    Platelets  418 (*)    Neutro Abs 12.4 (*)    Abs Immature Granulocytes 0.09 (*)    All other components within normal limits  CULTURE, BLOOD (ROUTINE X 2)  CULTURE, BLOOD (ROUTINE X 2)  COMPREHENSIVE METABOLIC PANEL WITH GFR  ETHANOL  LACTIC ACID, PLASMA  LACTIC ACID, PLASMA  PROTIME-INR  URINALYSIS, ROUTINE W REFLEX MICROSCOPIC  SALICYLATE LEVEL  ACETAMINOPHEN  LEVEL  URINE DRUG SCREEN  TSH  T4, FREE  T3, FREE  TROPONIN T, HIGH SENSITIVITY     EKG  ED ECG REPORT I, Reche CHRISTELLA Leventhal, the attending physician, personally viewed and interpreted this ECG.  Date: 05/02/2024 Rate: 56 bpm Rhythm: sinus rhythm QRS Axis: normal Intervals: Prolonged QT interval ST/T Wave abnormalities: normal Narrative Interpretation: no evidence of acute ischemia    RADIOLOGY     PROCEDURES:  Critical Care performed:   CRITICAL CARE Performed by: Reche CHRISTELLA Leventhal   Total critical care time:  30 minutes  Critical care time was exclusive of separately billable procedures and treating other patients.  Critical care was necessary to treat or prevent imminent or life-threatening deterioration.  Critical care was time spent personally by me on the following activities: development of treatment plan with patient and/or surrogate as well as nursing, discussions with consultants, evaluation of patient's response to treatment, examination of patient, obtaining history from patient or surrogate, ordering and performing treatments and interventions, ordering and review of laboratory studies, ordering and review of radiographic studies, pulse oximetry and re-evaluation of patient's condition.   Procedures   MEDICATIONS ORDERED IN ED: Medications  norepinephrine  (LEVOPHED ) 4mg  in (0.016 mg/mL) premix infusion (has no administration in time range)  calcium  gluconate 1 g/ 50 mL sodium chloride  IVPB (has no administration in time range)  lactated ringers  bolus 1,000 mL (1,000 mLs Intravenous New  Bag/Given 05/02/24 1654)  lactated ringers  bolus 1,000 mL (1,000 mLs Intravenous New Bag/Given 05/02/24 1652)     IMPRESSION / MDM / ASSESSMENT AND PLAN / ED COURSE  I reviewed the triage vital signs and the nursing notes.                              Differential diagnosis includes, but is not limited to, medication overdose, acute blood loss, sepsis  Patient's presentation is most consistent with acute presentation with potential threat to life or bodily function.  Patient is a 54 year old female with past medical history of hypertension and hypothyroidism presenting to the emergency department complaining of headache, chest pain, and abdominal pain.  She was noted in triage to have a blood pressure of 42/32.  The patient does report that she has been noting that her blood pressure was high the last few days and so she took an extra dose of her blood pressure medication.  She initially reported the blood pressure medication was losartan however I do not see this in her medication list but do see amlodipine  listed.  When asked the patient does state that it was the amlodipine  that she took.  It is unclear whether she took an extra dose last night, this morning, or both.  Patient's case was discussed with poison control who is sending over recommendations.  However, they do not feel that given the dosage the patient takes that her symptoms are related to this medication.  Patient is noted to have an elevated white blood cell count.  Sepsis protocol initiated.  Lactic acid is elevated at 2.1.  Potassium is 2.5.  IV replacement ordered.  Patient is noted to have an AKI with a significant increase in her creatinine.  The patient's blood pressure significantly improved with IV fluids.  I discussed with the patient her results and discussed plan for admission for further workup and treatment.  Patient is agreeable.  Discussed patient's case with the hospitalist who is in agreement with plan for  admission at this time.     FINAL CLINICAL IMPRESSION(S) / ED DIAGNOSES   Final diagnoses:  Hypotension, unspecified hypotension type  AKI (acute kidney injury)  Hypokalemia     Rx / DC Orders   ED Discharge Orders     None        Note:  This document was prepared using Dragon voice recognition software and may include unintentional dictation errors.   Rexford Reche HERO, MD 05/03/24 0010  "

## 2024-05-02 NOTE — Assessment & Plan Note (Addendum)
 Presyncope/fall Accidental overdose of antihypertensive  Patient states she took extra doses of antihypertensives for BP of 160/100 and associated with headache, neck and chest pain Awoke the following day lethargic with slurred speech Hold all antihypertensives and resume as appropriate Fall precautions IV fluids Treat possible sepsis as outlined above

## 2024-05-02 NOTE — Progress Notes (Signed)
 PHARMACY CONSULT NOTE - FOLLOW UP  Pharmacy Consult for Electrolyte Monitoring and Replacement   Recent Labs: Potassium (mmol/L)  Date Value  05/02/2024 2.5 (LL)  08/06/2014 3.0 (L)   Calcium  (mg/dL)  Date Value  98/97/7973 9.3   Calcium , Total (mg/dL)  Date Value  95/92/7983 8.9   Albumin (g/dL)  Date Value  98/97/7973 4.0  02/07/2024 4.3  08/06/2014 4.3   Sodium (mmol/L)  Date Value  05/02/2024 142  02/07/2024 141  08/06/2014 139     Assessment: 1/2:  K @ 1653 = 2.5  - Pt recevied KCl 10 mEq IV X 2 in ED around 1830    Goal of Therapy:  Electrolytes WNL   Plan:  - Will order KCl 10 mEq IV X 4  - recheck electrolytes on 1/03 with AM labs   Kimberleigh Mehan D ,PharmD Clinical Pharmacist 05/02/2024 10:41 PM

## 2024-05-02 NOTE — Assessment & Plan Note (Signed)
 Creatinine 2.49 up from baseline of 1.11 Likely ATN related to hypotension with renal hypoperfusion Expecting improvement to baseline with IV fluid resuscitation

## 2024-05-03 ENCOUNTER — Observation Stay: Payer: Self-pay

## 2024-05-03 DIAGNOSIS — T465X1A Poisoning by other antihypertensive drugs, accidental (unintentional), initial encounter: Secondary | ICD-10-CM

## 2024-05-03 DIAGNOSIS — R55 Syncope and collapse: Secondary | ICD-10-CM

## 2024-05-03 DIAGNOSIS — E876 Hypokalemia: Secondary | ICD-10-CM

## 2024-05-03 DIAGNOSIS — F411 Generalized anxiety disorder: Secondary | ICD-10-CM

## 2024-05-03 DIAGNOSIS — F409 Phobic anxiety disorder, unspecified: Secondary | ICD-10-CM

## 2024-05-03 DIAGNOSIS — E039 Hypothyroidism, unspecified: Secondary | ICD-10-CM

## 2024-05-03 DIAGNOSIS — I959 Hypotension, unspecified: Secondary | ICD-10-CM

## 2024-05-03 DIAGNOSIS — F5105 Insomnia due to other mental disorder: Secondary | ICD-10-CM

## 2024-05-03 LAB — HIV ANTIBODY (ROUTINE TESTING W REFLEX): HIV Screen 4th Generation wRfx: NONREACTIVE

## 2024-05-03 LAB — BASIC METABOLIC PANEL WITH GFR
Anion gap: 13 (ref 5–15)
Anion gap: 13 (ref 5–15)
BUN: 14 mg/dL (ref 6–20)
BUN: 12 mg/dL (ref 6–20)
CO2: 27 mmol/L (ref 22–32)
CO2: 23 mmol/L (ref 22–32)
Calcium: 9.4 mg/dL (ref 8.9–10.3)
Calcium: 8.5 mg/dL — ABNORMAL LOW (ref 8.9–10.3)
Chloride: 103 mmol/L (ref 98–111)
Chloride: 108 mmol/L (ref 98–111)
Creatinine, Ser: 2.34 mg/dL — ABNORMAL HIGH (ref 0.44–1.00)
Creatinine, Ser: 1.9 mg/dL — ABNORMAL HIGH (ref 0.44–1.00)
GFR, Estimated: 24 mL/min — ABNORMAL LOW
GFR, Estimated: 31 mL/min — ABNORMAL LOW
Glucose, Bld: 122 mg/dL — ABNORMAL HIGH (ref 70–99)
Glucose, Bld: 114 mg/dL — ABNORMAL HIGH (ref 70–99)
Potassium: 2.6 mmol/L — CL (ref 3.5–5.1)
Potassium: 2.9 mmol/L — ABNORMAL LOW (ref 3.5–5.1)
Sodium: 143 mmol/L (ref 135–145)
Sodium: 143 mmol/L (ref 135–145)

## 2024-05-03 LAB — MAGNESIUM
Magnesium: 1.9 mg/dL (ref 1.7–2.4)
Magnesium: 1.6 mg/dL — ABNORMAL LOW (ref 1.7–2.4)

## 2024-05-03 LAB — CBC
HCT: 35.4 % — ABNORMAL LOW (ref 36.0–46.0)
Hemoglobin: 11.7 g/dL — ABNORMAL LOW (ref 12.0–15.0)
MCH: 28.1 pg (ref 26.0–34.0)
MCHC: 33.1 g/dL (ref 30.0–36.0)
MCV: 85.1 fL (ref 80.0–100.0)
Platelets: 438 K/uL — ABNORMAL HIGH (ref 150–400)
RBC: 4.16 MIL/uL (ref 3.87–5.11)
RDW: 13.5 % (ref 11.5–15.5)
WBC: 19.2 K/uL — ABNORMAL HIGH (ref 4.0–10.5)
nRBC: 0 % (ref 0.0–0.2)

## 2024-05-03 LAB — CORTISOL-AM, BLOOD: Cortisol - AM: 12.6 ug/dL (ref 6.7–22.6)

## 2024-05-03 LAB — PROTIME-INR
INR: 1 (ref 0.8–1.2)
Prothrombin Time: 13.4 s (ref 11.4–15.2)

## 2024-05-03 MED ORDER — POTASSIUM CHLORIDE 10 MEQ/100ML IV SOLN
10.0000 meq | INTRAVENOUS | Status: AC
  Administered 2024-05-03 (×4): 10 meq via INTRAVENOUS
  Filled 2024-05-03 (×4): qty 100

## 2024-05-03 MED ORDER — MAGNESIUM SULFATE 2 GM/50ML IV SOLN
2.0000 g | Freq: Every day | INTRAVENOUS | Status: DC
  Administered 2024-05-03: 2 g via INTRAVENOUS
  Filled 2024-05-03: qty 50

## 2024-05-03 MED ORDER — AMLODIPINE BESYLATE 5 MG PO TABS: 5.0000 mg | ORAL_TABLET | Freq: Every day | ORAL | 0 refills | Status: DC

## 2024-05-03 MED ORDER — HYALURONIDASE HUMAN 150 UNIT/ML IJ SOLN
150.0000 [IU] | Freq: Once | INTRAMUSCULAR | Status: AC
  Administered 2024-05-03: 150 [IU] via SUBCUTANEOUS
  Filled 2024-05-03: qty 1

## 2024-05-03 MED ORDER — AMLODIPINE BESYLATE 5 MG PO TABS
5.0000 mg | ORAL_TABLET | Freq: Every day | ORAL | Status: DC
  Administered 2024-05-03: 5 mg via ORAL
  Filled 2024-05-03: qty 1

## 2024-05-03 MED ORDER — POTASSIUM CHLORIDE CRYS ER 20 MEQ PO TBCR: 20.0000 meq | EXTENDED_RELEASE_TABLET | Freq: Every day | ORAL | 0 refills | Status: DC

## 2024-05-03 MED ORDER — POTASSIUM CHLORIDE CRYS ER 20 MEQ PO TBCR
40.0000 meq | EXTENDED_RELEASE_TABLET | Freq: Two times a day (BID) | ORAL | Status: DC
  Administered 2024-05-03: 40 meq via ORAL

## 2024-05-03 NOTE — ED Notes (Signed)
 Pt called out c/o burning with IV potassium administration. This RN switched medication to 18G IV and slowed rate. Pt denies burning at this time.

## 2024-05-03 NOTE — Discharge Summary (Signed)
 " Physician Discharge Summary  Patient: Stephanie Larson FMW:[id number] DOB: 10/12/1970   Code Status: Full Code Admit date: 05/02/2024 Discharge date: 05/03/2024 Disposition: Home, No home health services recommended PCP: Kristina Tinnie POUR, PA-C  Recommendations for Outpatient Follow-up:  Follow up with PCP within 1-2 weeks Regarding general hospital follow up and preventative care Recommend monitoring BP Reviewing her toe injuriy Rechecking K+  Discharge Diagnoses:  Principal Problem:   SIRS (systemic inflammatory response syndrome) (HCC) Active Problems:   Postural dizziness with presyncope   Accidental overdose of antihypertensive   Hypotension   AKI (acute kidney injury)   Hypokalemia   Cervical radiculopathy   Hypothyroidism   GAD (generalized anxiety disorder)   Lactic acidosis  Brief Hospital Course Summary: Stephanie Larson is a 54 y.o. female with a PMH significant for Hypertension, hypothyroidism, anxiety and depression, being admitted with somnolence and hypotension after an accidental overdose of blood pressure medications taken for elevated BP.   Upon arrival, BP 42/32 with pulse in the 50s.  Other vitals within normal limits.  BP was fluid responsive to 113/79 by admission. Lab work revealed WBC of 15,000 with a lactic acid of 2.0.  Hemoglobin 11.5 down from baseline of 12. Potassium 2.5.  Creatinine 2.49 up from baseline of 1.11. Troponin 22, TSH 0.856 EtOH, acetaminophen  and salicylate acid levels undetectable UA unremarkable Chest x-ray clear EKG with sinus bradycardia at 56   Poison control was contacted and recommended observation, ECG and potassium repletion.   Patient treated empirically for sepsis with sepsis fluids and broad-spectrum antibiotics of vancomycin , cefepime  and Flagyl , given IV potassium repletion on admission but given no acute findings of infection, stopped antibiotics.   With holding of the offensive agents, her BP recovered and even became elevated  during her period of observation. Home amlodipine  was increased to 5mg  daily and warned her against PRN medications going forward.  She had no ECG abnormalities and poison control closed the case. Her husband stated that she was back to normal baseline mental state.    Of note, potassium had been replenished with IV on admission and the access site had extraversion. The site was treated with hylenex  and hot compresses and improved. She was further more replenished with PO K+. K+ was 2.9 prior to replenishment on day of dc. She was instructed to follow up with her PCP for recheck.   She had bruising and pain of 2nd and 3rd toes on left foot which were imaged and xray showed no fracture. Supportive care was prescribed.   All other chronic conditions were treated with home medications.    Discharge Condition: Good, improved Recommended discharge diet: Regular healthy diet  Consultations: None   Procedures/Studies: None  Allergies as of 05/03/2024       Reactions   Seroquel [quetiapine Fumarate] Other (See Comments)   nightmares        Medication List     TAKE these medications    rOPINIRole  1 MG tablet Commonly known as: REQUIP  Take 1 tablet (1 mg total) by mouth at bedtime. The timing of this medication is very important.   amLODipine  5 MG tablet Commonly known as: NORVASC  Take 1 tablet (5 mg total) by mouth daily. Start taking on: May 04, 2024 What changed:  medication strength how much to take   atorvastatin  10 MG tablet Commonly known as: LIPITOR Take 1 tablet (10 mg total) by mouth daily.   fexofenadine  180 MG tablet Commonly known as: ALLEGRA  Take 1 tablet (180  mg total) by mouth daily.   fluticasone  50 MCG/ACT nasal spray Commonly known as: FLONASE  PLACE 2 SPRAYS INTO BOTH NOSTRILS DAILY   gabapentin  300 MG capsule Commonly known as: NEURONTIN  Take 1 capsule (300 mg total) by mouth 2 (two) times daily.   hydrOXYzine  50 MG tablet Commonly known as:  ATARAX  TAKE 1 TABLET BY MOUTH 3 TIMES DAILY AS NEEDED   mometasone  0.1 % cream Commonly known as: ELOCON  APPLY TO AFFECTED AREAS FROM FACE TO LOWER EXTREMITIES TWICE A DAY TO SPOT TREAT DURING FLARES   montelukast  10 MG tablet Commonly known as: SINGULAIR  Take 1 tablet (10 mg total) by mouth at bedtime.   potassium chloride  SA 20 MEQ tablet Commonly known as: KLOR-CON  M Take 1 tablet (20 mEq total) by mouth daily.   Synthroid  150 MCG tablet Generic drug: levothyroxine  TAKE 1 TABLET BY MOUTH DAILY   tiZANidine  4 MG tablet Commonly known as: ZANAFLEX  TAKE 1/2 TO 1 TABLET BY MOUTH 2 TIMES DAILY AS NEEDED FOR MUSCLE SPASMS   zolpidem  12.5 MG CR tablet Commonly known as: AMBIEN  CR Take 1 tablet (12.5 mg total) by mouth at bedtime as needed. for sleep        Follow-up Information     McDonough, Tinnie POUR, PA-C. Schedule an appointment as soon as possible for a visit in 1 week(s).   Specialty: Physician Assistant Contact information: [address] [place] [address] [phone]                Subjective   Pt reports feeling much better. Complains of painful toes. Husband states she is back at her baseline mental status.  All questions and concerns were addressed at time of discharge.  Objective  Blood pressure (!) 165/90, pulse 95, temperature 98.3 F (36.8 C), temperature source Oral, resp. rate (!) 25, height 5' 6 (1.676 m), weight 102.1 kg, last menstrual period 04/18/2019, SpO2 100%.   General: Pt is alert, awake, not in acute distress Cardiovascular: RRR, S1/S2 +, no rubs, no gallops Respiratory: CTA bilaterally, no wheezing, no rhonchi Abdominal: Soft, NT, ND, bowel sounds + Extremities: no edema, no cyanosis Bruised left 2nd and 3rd toes  The results of significant diagnostics from this hospitalization (including imaging, microbiology, ancillary and laboratory) are listed below for reference.   Imaging studies: DG Foot 2 Views Left Result Date:  05/03/2024 EXAM: 2 VIEW(S) XRAY OF THE LEFT FOOT 05/03/2024 09:48:54 AM COMPARISON: None available. CLINICAL HISTORY: Pain FINDINGS: BONES AND JOINTS: No acute fracture. No malalignment. Mild hallux valgus deformity. SOFT TISSUES: The soft tissues are unremarkable. IMPRESSION: 1. Mild hallux valgus deformity. Electronically signed by: Norman Gatlin MD 05/03/2024 09:58 AM EST RP Workstation: [id number]   DG Chest Portable 1 View Result Date: 05/02/2024 EXAM: 1 VIEW XRAY OF THE CHEST 05/02/2024 06:24:40 PM COMPARISON: None available. CLINICAL HISTORY: weakness FINDINGS: LUNGS AND PLEURA: No focal pulmonary opacity. No pleural effusion. No pneumothorax. HEART AND MEDIASTINUM: No acute abnormality of the cardiac and mediastinal silhouettes. BONES AND SOFT TISSUES: No acute osseous abnormality. IMPRESSION: 1. No acute cardiopulmonary process. Electronically signed by: Kate Plummer MD 05/02/2024 07:15 PM EST RP Workstation: [id number]    Labs: Basic Metabolic Panel: Recent Labs  Lab 05/02/24 1653 05/02/24 2327 05/03/24 0755 05/03/24 0823  NA 142 143 143  --   K 2.5* 2.6* 2.9*  --   CL 104 103 108  --   CO2 25 27 23   --   GLUCOSE 122* 122* 114*  --   BUN  13 14 12   --   CREATININE 2.49* 2.34* 1.90*  --   CALCIUM  9.3 9.4 8.5*  --   MG  --  1.9  --  1.6*   CBC: Recent Labs  Lab 05/02/24 1653 05/02/24 2327  WBC 15.4* 19.2*  NEUTROABS 12.4*  --   HGB 11.5* 11.7*  HCT 34.2* 35.4*  MCV 84.4 85.1  PLT 418* 438*   Microbiology: Results for orders placed or performed during the hospital encounter of 05/02/24  Culture, blood (routine x 2)     Status: None (Preliminary result)   Collection Time: 05/02/24  4:52 PM   Specimen: BLOOD  Result Value Ref Range Status   Specimen Description BLOOD LEFT ANTECUBITAL  Final   Special Requests   Final    BOTTLES DRAWN AEROBIC AND ANAEROBIC Blood Culture adequate volume   Culture   Final    NO GROWTH < 24 HOURS Performed at [facility],  [address] [place], [place] [address]    Report Status PENDING  Incomplete  Culture, blood (routine x 2)     Status: None (Preliminary result)   Collection Time: 05/02/24  4:53 PM   Specimen: BLOOD  Result Value Ref Range Status   Specimen Description BLOOD RIGHT ANTECUBITAL  Final   Special Requests   Final    BOTTLES DRAWN AEROBIC AND ANAEROBIC Blood Culture results may not be optimal due to an inadequate volume of blood received in culture bottles   Culture   Final    NO GROWTH < 24 HOURS Performed at [facility], [address] [place], [place] [address]    Report Status PENDING  Incomplete    Time coordinating discharge: Over 30 minutes  Marien LITTIE Piety, MD  [facility] 05/03/2024, 10:24 AM  "

## 2024-05-03 NOTE — ED Notes (Signed)
 Dr. Cleatus made aware that pt's 0500 labs were collected at 2347. States to wait until potassium has finished and then new labs will be ordered.

## 2024-05-03 NOTE — Progress Notes (Signed)
"   Hylenex  was given at 1133 and warm compress applied. Reassessment of left anterior forearm is still red, but now soft not hard as before the hylenex  was given. Pain intensity also reduced. Pharmacist and Dr. Lenon notified. Per Dr. Lenon, pt can d/c if patient feels comfortable going home and returning if it worsens.  Skin marked around demarcated edges for monitoring. Patient agreed with the plan. "

## 2024-05-03 NOTE — ED Notes (Signed)
 Poison control updated at this time- no further recommendations, case closed by PC.

## 2024-05-03 NOTE — Discharge Instructions (Addendum)
 There was no fracture seen on your foot xray. You can take tylenol  for pain, elevate your foot when resting, and apply ice to help with swelling and pain  I have increased your home blood pressure medication to 5mg  amlodipine  daily.  I prescribed 4 days of a potassium supplement to increase your levels.   Please follow up with your PCP in about 1 week for hospital follow up appointment to recheck your toes, blood pressure, and your blood work   I recommend that you do not take any additional blood pressure medications as needed and only stick to the prescribed medication

## 2024-05-03 NOTE — Consult Note (Signed)
 PHARMACY CONSULT NOTE - ELECTROLYTES  Pharmacy Consult for Electrolyte Monitoring and Replacement   Recent Labs: Potassium (mmol/L)  Date Value  05/03/2024 2.9 (L)  08/06/2014 3.0 (L)   Magnesium  (mg/dL)  Date Value  98/96/7973 1.6 (L)   Calcium  (mg/dL)  Date Value  98/96/7973 8.5 (L)   Calcium , Total (mg/dL)  Date Value  95/92/7983 8.9   Albumin (g/dL)  Date Value  98/97/7973 4.0  02/07/2024 4.3  08/06/2014 4.3   Sodium (mmol/L)  Date Value  05/03/2024 143  02/07/2024 141  08/06/2014 139   Height: 5' 6 (167.6 cm) Weight: 102.1 kg (225 lb) IBW/kg (Calculated) : 59.3 Estimated Creatinine Clearance: 42.2 mL/min (A) (by C-G formula based on SCr of 1.9 mg/dL (H)).  Assessment  Stephanie Larson is a 54 y.o. female presenting with SIRS. PMH significant for hypertension, hypothyroidism, anxiety and depression. Pharmacy has been consulted to monitor and replace electrolytes.  Diet: Heart healthy MIVF: N/A Pertinent medications: N/A  Goal of Therapy: Electrolytes within normal limits  Plan:  K 2.9, Kcl 40 mEq PO BID x 2 doses ordered per provider Mg 1.6, will order magnesium  sulfate 2 g IV daily x 2 days Follow-up BMP, Mg tomorrow AM  Thank you for allowing pharmacy to be a part of this patients care.  Marolyn KATHEE Mare 05/03/2024 11:34 AM

## 2024-05-03 NOTE — ED Notes (Signed)
 This RN called to follow up with poison control and spoke with Karna. It is recommended that a repeat EKG be performed once pt has completed potassium replacement medication. Poison control recommends that staff follow up after EKG is completed.  [phone]

## 2024-05-03 NOTE — Progress Notes (Signed)
 Pharmacy Antibiotic Note  Stephanie Larson is a 54 y.o. female admitted on 05/02/2024 with sepsis.  Pharmacy has been consulted for Vancomycin , Cefepime  dosing.   Pt appears to be AKI ;  baseline SrCr ~ 1.2,  presents with SrCr 2.34.  - will dose vanc by levels til renal function stable   Plan: Cefepime  2 gm IV X 1 given in ED on 1/02 @ 1747 Cefepime  2 gm IV Q12H ordered to start on 1/03 @ 0600.  Vancomycin  1 gm IV X 1 on 1/02 @ 2002.  Will order additional Vancomycin  1500 mg IV X 1 to make total loading dose of 2500 mg.  - will dose Vancomycin  by levels til renal function stable - Vancomycin  random ordered for 1/03 @ 2000 - Goal trough :  15 - 20 mcg/mL    Height: 5' 6 (167.6 cm) Weight: 102.1 kg (225 lb) IBW/kg (Calculated) : 59.3  Temp (24hrs), Avg:98.2 F (36.8 C), Min:98.2 F (36.8 C), Max:98.2 F (36.8 C)  Recent Labs  Lab 05/02/24 1653 05/02/24 1835 05/02/24 2327  WBC 15.4*  --  19.2*  CREATININE 2.49*  --  2.34*  LATICACIDVEN 2.1* 2.0*  --     Estimated Creatinine Clearance: 34.3 mL/min (A) (by C-G formula based on SCr of 2.34 mg/dL (H)).    Allergies[1]  Antimicrobials this admission:   >>    >>   Dose adjustments this admission:   Microbiology results:  BCx:   UCx:    Sputum:    MRSA PCR:   Thank you for allowing pharmacy to be a part of this patients care.  Annaston Upham D 05/03/2024 2:35 AM     [1]  Allergies Allergen Reactions   Seroquel [Quetiapine Fumarate] Other (See Comments)    nightmares

## 2024-05-03 NOTE — ED Notes (Signed)
 Pt is AxO4 at this time. Pt able to ambulate to restroom x1 assistance.

## 2024-05-03 NOTE — ED Notes (Addendum)
 Pt reports they may have run IV potassium through left IV access and states It was burning and then stopped working. Area is tender, swollen, red, approximately 2-3 inches wide. Pharmacy notified- reversal agent recommended by pharmacy.

## 2024-05-05 ENCOUNTER — Ambulatory Visit: Payer: Self-pay | Admitting: Physician Assistant

## 2024-05-05 LAB — T3, FREE: T3, Free: 3.1 pg/mL (ref 2.0–4.4)

## 2024-05-07 LAB — CULTURE, BLOOD (ROUTINE X 2)
Culture: NO GROWTH
Culture: NO GROWTH
Special Requests: ADEQUATE

## 2024-05-12 ENCOUNTER — Ambulatory Visit: Payer: Self-pay | Admitting: Physician Assistant

## 2024-05-12 ENCOUNTER — Encounter: Payer: Self-pay | Admitting: Physician Assistant

## 2024-05-12 VITALS — BP 128/80 | HR 86 | Temp 98.0°F | Resp 16 | Ht 66.0 in | Wt 176.0 lb

## 2024-05-12 DIAGNOSIS — G2581 Restless legs syndrome: Secondary | ICD-10-CM

## 2024-05-12 DIAGNOSIS — I10 Essential (primary) hypertension: Secondary | ICD-10-CM

## 2024-05-12 DIAGNOSIS — E876 Hypokalemia: Secondary | ICD-10-CM

## 2024-05-12 DIAGNOSIS — F5105 Insomnia due to other mental disorder: Secondary | ICD-10-CM

## 2024-05-12 DIAGNOSIS — N179 Acute kidney failure, unspecified: Secondary | ICD-10-CM

## 2024-05-12 DIAGNOSIS — D72829 Elevated white blood cell count, unspecified: Secondary | ICD-10-CM

## 2024-05-12 DIAGNOSIS — D75839 Thrombocytosis, unspecified: Secondary | ICD-10-CM

## 2024-05-12 DIAGNOSIS — F409 Phobic anxiety disorder, unspecified: Secondary | ICD-10-CM

## 2024-05-12 DIAGNOSIS — G8929 Other chronic pain: Secondary | ICD-10-CM

## 2024-05-12 DIAGNOSIS — M545 Low back pain, unspecified: Secondary | ICD-10-CM

## 2024-05-12 MED ORDER — ZOLPIDEM TARTRATE ER 12.5 MG PO TBCR: 12.5000 mg | EXTENDED_RELEASE_TABLET | Freq: Every evening | ORAL | 2 refills | Status: AC | PRN

## 2024-05-12 MED ORDER — GABAPENTIN 300 MG PO CAPS: 300.0000 mg | ORAL_CAPSULE | Freq: Two times a day (BID) | ORAL | 2 refills | Status: AC

## 2024-05-12 MED ORDER — ROPINIROLE HCL 1 MG PO TABS: 1.0000 mg | ORAL_TABLET | Freq: Every day | ORAL | 2 refills | Status: AC

## 2024-05-12 MED ORDER — TIZANIDINE HCL 4 MG PO TABS: ORAL_TABLET | ORAL | 2 refills | Status: AC

## 2024-05-12 NOTE — Progress Notes (Unsigned)
 [facility] [address] [place], [place] [address]  Internal MEDICINE  Office Visit Note  Patient Name: Stephanie Larson  [id number]  [id number]  Date of Service: 05/12/2024  Chief Complaint  Patient presents with   Follow-up    HPI Pt is here for routine follow up -Had a fall and husband called their daughter who couldn't feel her pulse and so they took her to ED. She has a right black eye -CT not done in ED. Denies headaches or vision changes and defers CT -accidentally took 2 of her husband's BP pill instead of hers. He is on higher BP meds than hers. Her chest was hurting and BP was high so took extra tab but took his instead. 193/111 at home that night. Does state she had a disagreement prior and may have driven BP up. -given IV meds, levophed , potassium -did raise to 5mg  amlodipine  on discharge and is doing well on this -states she has seen hematology for high platelet and WBC years ago, so this is why she declined to go back with new referral after recently advised on this  Current Medication: Outpatient Encounter Medications as of 05/12/2024  Medication Sig   amLODipine  (NORVASC ) 5 MG tablet Take 1 tablet (5 mg total) by mouth daily.   atorvastatin  (LIPITOR) 10 MG tablet Take 1 tablet (10 mg total) by mouth daily.   fexofenadine  (ALLEGRA ) 180 MG tablet Take 1 tablet (180 mg total) by mouth daily.   fluticasone  (FLONASE ) 50 MCG/ACT nasal spray PLACE 2 SPRAYS INTO BOTH NOSTRILS DAILY   gabapentin  (NEURONTIN ) 300 MG capsule Take 1 capsule (300 mg total) by mouth 2 (two) times daily.   hydrOXYzine  (ATARAX ) 50 MG tablet TAKE 1 TABLET BY MOUTH 3 TIMES DAILY AS NEEDED   mometasone  (ELOCON ) 0.1 % cream APPLY TO AFFECTED AREAS FROM FACE TO LOWER EXTREMITIES TWICE A DAY TO SPOT TREAT DURING FLARES   montelukast  (SINGULAIR ) 10 MG tablet Take 1 tablet (10 mg total) by mouth at bedtime.   rOPINIRole  (REQUIP ) 1 MG tablet Take 1 tablet (1 mg total) by mouth at bedtime.   SYNTHROID   150 MCG tablet TAKE 1 TABLET BY MOUTH DAILY   tiZANidine  (ZANAFLEX ) 4 MG tablet TAKE 1/2 TO 1 TABLET BY MOUTH 2 TIMES DAILY AS NEEDED FOR MUSCLE SPASMS   zolpidem  (AMBIEN  CR) 12.5 MG CR tablet Take 1 tablet (12.5 mg total) by mouth at bedtime as needed. for sleep   [DISCONTINUED] potassium chloride  SA (KLOR-CON  M) 20 MEQ tablet Take 1 tablet (20 mEq total) by mouth daily.   No facility-administered encounter medications on file as of 05/12/2024.    Surgical History: Past Surgical History:  Procedure Laterality Date   CYSTOSCOPY N/A 05/13/2019   Procedure: CYSTOSCOPY;  Surgeon: Leonce Garnette JONETTA, MD;  Location: [facility] ORS;  Service: Gynecology;  Laterality: N/A;   LAPAROSCOPIC GASTRIC BANDING     TOTAL LAPAROSCOPIC HYSTERECTOMY WITH SALPINGECTOMY N/A 05/13/2019   Procedure: TOTAL LAPAROSCOPIC HYSTERECTOMY WITH BILATERAL SALPINGECTOMY;  Surgeon: Leonce Garnette JONETTA, MD;  Location: [facility] ORS;  Service: Gynecology;  Laterality: N/A;   TUBAL LIGATION      Medical History: Past Medical History:  Diagnosis Date   Anxiety    Depression    Family history of ovarian cancer    qualifies for cancer genetic testing   Hypothyroidism    Insomnia    Memory changes    Sinus drainage    Sleep apnea    Thyroid  disease     Family  History: Family History  Problem Relation Age of Onset   Colon cancer Paternal Grandfather    Ovarian cancer Paternal Grandmother 82   Hypertension Maternal Grandmother    Hypertension Father    Emphysema Father    ALS Mother    Hypertension Mother     Social History   Socioeconomic History   Marital status: Married    Spouse name: Not on file   Number of children: Not on file   Years of education: Not on file   Highest education level: Not on file  Occupational History   Not on file  Tobacco Use   Smoking status: Former    Current packs/day: 0.50    Types: Cigarettes   Smokeless tobacco: Never   Tobacco comments:    Pt quit 10/22  Vaping Use   Vaping  status: Never Used  Substance and Sexual Activity   Alcohol use: Not Currently    Comment: occasionally   Drug use: Never   Sexual activity: Yes    Birth control/protection: None  Other Topics Concern   Not on file  Social History Narrative   Not on file   Social Drivers of Health   Tobacco Use: Medium Risk (05/12/2024)   Patient History    Smoking Tobacco Use: Former    Smokeless Tobacco Use: Never    Passive Exposure: Not on Actuary Strain: Not on file  Food Insecurity: Not on file  Transportation Needs: Not on file  Physical Activity: Not on file  Stress: Not on file  Social Connections: Not on file  Intimate Partner Violence: Not on file  Depression (PHQ2-9): Low Risk (02/07/2024)   Depression (PHQ2-9)    PHQ-2 Score: 0  Alcohol Screen: Low Risk (02/07/2024)   Alcohol Screen    Last Alcohol Screening Score (AUDIT): 2  Housing: Not on file  Utilities: Not on file  Health Literacy: Not on file      Review of Systems  Vital Signs: BP 128/80   Pulse 86   Temp 98 F (36.7 C)   Resp 16   Ht 5' 6 (1.676 m)   Wt 176 lb (79.8 kg)   LMP 04/18/2019   SpO2 100%   BMI 28.41 kg/m    Physical Exam     Assessment/Plan:   General Counseling: Maram verbalizes understanding of the findings of todays visit and agrees with plan of treatment. I have discussed any further diagnostic evaluation that may be needed or ordered today. We also reviewed her medications today. she has been encouraged to call the office with any questions or concerns that should arise related to todays visit.    No orders of the defined types were placed in this encounter.   No orders of the defined types were placed in this encounter.   This patient was seen by Tinnie Pro, PA-C in collaboration with Dr. Sigrid Bathe as a part of collaborative care agreement.   Total time spent:*** Minutes Time spent includes review of chart, medications, test results, and follow up  plan with the patient.      Dr Fozia M Khan Internal medicine

## 2024-05-13 ENCOUNTER — Other Ambulatory Visit: Payer: Self-pay | Admitting: Physician Assistant

## 2024-05-13 DIAGNOSIS — F411 Generalized anxiety disorder: Secondary | ICD-10-CM

## 2024-06-04 ENCOUNTER — Other Ambulatory Visit: Payer: Self-pay | Admitting: Physician Assistant

## 2024-08-11 ENCOUNTER — Encounter: Payer: Self-pay | Admitting: Physician Assistant
